# Patient Record
Sex: Male | Born: 1965 | Race: Black or African American | Hispanic: No | Marital: Married | Smoking: Never smoker
Health system: Southern US, Community
[De-identification: ages and names within clinical notes are randomized; demographics above are authoritative.]

## PROBLEM LIST (undated history)

## (undated) DIAGNOSIS — Z860100 Personal history of colon polyps, unspecified: Secondary | ICD-10-CM

## (undated) DIAGNOSIS — M1990 Unspecified osteoarthritis, unspecified site: Secondary | ICD-10-CM

## (undated) DIAGNOSIS — M2550 Pain in unspecified joint: Secondary | ICD-10-CM

## (undated) DIAGNOSIS — D689 Coagulation defect, unspecified: Secondary | ICD-10-CM

## (undated) DIAGNOSIS — M549 Dorsalgia, unspecified: Secondary | ICD-10-CM

## (undated) HISTORY — PX: JOINT REPLACEMENT: SHX530

## (undated) HISTORY — PX: COLONOSCOPY: SHX174

## (undated) HISTORY — DX: Unspecified osteoarthritis, unspecified site: M19.90

## (undated) HISTORY — PX: ESOPHAGOGASTRODUODENOSCOPY: SHX1529

## (undated) HISTORY — PX: FRACTURE SURGERY: SHX138

## (undated) HISTORY — PX: WRIST FRACTURE SURGERY: SHX121

## (undated) HISTORY — DX: Coagulation defect, unspecified: D68.9

## (undated) HISTORY — PX: TONSILLECTOMY AND ADENOIDECTOMY: SUR1326

---
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
## 2005-10-20 ENCOUNTER — Emergency Department (HOSPITAL_COMMUNITY): Admission: EM | Admit: 2005-10-20 | Discharge: 2005-10-20 | Payer: Self-pay | Admitting: Emergency Medicine

## 2009-10-28 ENCOUNTER — Emergency Department (HOSPITAL_COMMUNITY): Admission: EM | Admit: 2009-10-28 | Discharge: 2009-10-29 | Payer: Self-pay | Admitting: Emergency Medicine

## 2011-01-08 LAB — POCT I-STAT, CHEM 8
Chloride: 109 mEq/L (ref 96–112)
Creatinine, Ser: 1 mg/dL (ref 0.4–1.5)
HCT: 42 % (ref 39.0–52.0)
Hemoglobin: 14.3 g/dL (ref 13.0–17.0)
Potassium: 4.1 mEq/L (ref 3.5–5.1)
Sodium: 137 mEq/L (ref 135–145)

## 2012-01-31 ENCOUNTER — Encounter (INDEPENDENT_AMBULATORY_CARE_PROVIDER_SITE_OTHER): Payer: Self-pay | Admitting: Surgery

## 2012-01-31 ENCOUNTER — Ambulatory Visit (INDEPENDENT_AMBULATORY_CARE_PROVIDER_SITE_OTHER): Payer: BC Managed Care – PPO | Admitting: Surgery

## 2012-01-31 NOTE — Progress Notes (Addendum)
Re:   [name] DOB:   1966/07/02 MRN:   [id number]  ASSESSMENT AND PLAN: 1.  Morbid obesity.  Weight 349, BMIT -52.9.  Per the 1991 NIH Consensus Statement, the patient is a candidate for bariatric surgery.  The patient attended our initial information session and reviewed the types of bariatric surgery.    The patient is interested in the Roux en Y Gastric Bypass.  I discussed with the patient the indications and risks of bariatric surgery.  The potential risks of surgery include, but are not limited to, bleeding, infection, leak from the bowel, DVT and PE, open surgery, long term nutrition consequences, and death.  The patient understands the importance of compliance and long term follow-up with our group after surgery.  From here we will obtain lab tests, x-rays, nutrition consult, and psych consult.  He has successfully lost weight before and I encouraged him to lose weight in preparation for surgery.  2.  Left knee arthritis form injury. 3.  Back and right hip problems.   Chief Complaint  Patient presents with  . Bariatric Pre-op    RYN   REFERRING PHYSICIAN:  Goes to [facility] for evaluation.  HISTORY OF PRESENT ILLNESS: [name] is a pleasant 46 y.o. (DOB: [birth year])  AA male whose primary care physician is [facility] (he sees several physicians there) and comes to me today for gastric bypass/bariatric surgery.  The patient went to information sessions. He cannot remember the name position spoke. He has at least 3 coworkers who have had gastric bypass. He has discussed surgery with them. He also thought about LAP-BAND surgery, but a subtotal gastric bypass.  He has tried multiple diets throughout his life. At about 23, he did Metafast for a year and half loss for 360 pounds to about 212 pounds. He got married and gained a lot of the weight back. He has tried limiting his oral intake. He went throught the Bariatric Weight loss center in [place] and  took their pill.  He did well, but it was expensive, so he discontinued it.  Most recently he tried working out, but because of left knee pain had to give up on the idea of working out to lose weight.   No past medical history on file.   No past surgical history on file.    No current outpatient prescriptions on file.     No Known Allergies  REVIEW OF SYSTEMS: Skin:  No history of rash.  No history of abnormal moles. Infection:  No history of hepatitis or HIV.  No history of MRSA. Neurologic:  No history of stroke.  No history of seizure.  No history of headaches. Cardiac:  No history of hypertension. No history of heart disease.  No history of prior cardiac catheterization.  No history of seeing a cardiologist. Pulmonary:  Does not smoke cigarettes.  No asthma or bronchitis.  No OSA/CPAP.  Endocrine:  No diabetes. No thyroid disease. Gastrointestinal:  No history of stomach disease.  No history of liver disease.  No history of gall bladder disease.  No history of pancreas disease.  Colonoscopy in 2010 removed polyp. Unsure of name of colonoscopist. Urologic:  No history of kidney stones.  No history of bladder infections. Musculoskeletal:  Injured left knee playing baseball at 46 years old. Sees Dr[facility] Oleta [facility] at [facility]  Left knee with "bone on bone", too young for knee replacement. Hematologic:  No bleeding disorder.  No history of anemia.  Not anticoagulated. Psycho-social:  The patient is oriented.   The patient has no obvious psychologic or social impairment to understanding our conversation and plan.  SOCIAL and FAMILY HISTORY: Works at [employer] center. Married.  Wife works at [employer] call center.  PHYSICAL EXAM: BP 144/86  Pulse 80  Temp(Src) 97.7 F (36.5 C) (Temporal)  Resp 18  Ht 5\' 8"  (1.727 m)  Wt 349 lb (158.305 kg)  BMI 53.07 kg/m2  General: AA M who is alert and generally healthy appearing.  HEENT: Normal. Pupils equal. Good dentition. Neck: Supple.  No mass.  No thyroid mass.  Carotid pulse okay with no bruit. Lymph Nodes:  No supraclavicular or cervical nodes. Lungs: Clear to auscultation and symmetric breath sounds. Heart:  RRR. No murmur or rub.  Abdomen: Soft. No mass. No tenderness. No hernia. Normal bowel sounds.  No abdominal scars.  He is about 1/2 apple and 1/2 pear. Rectal: Not done.  He is followed by colonoscopies. Extremities:  Good strength and ROM  in upper and lower extremities.  Scar over left knee.  Valgus change at knee on both LE. Neurologic:  Grossly intact to motor and sensory function. Psychiatric: Has normal mood and affect. Behavior is normal.   DATA REVIEWED: Notes  Ovidio Kin, MD,  [facility], [place] [address] [place]  [address]   [place], [place]    [address] Phone:  [phone] FAX:  [phone]

## 2012-02-01 LAB — TSH: TSH: 2.81 u[IU]/mL (ref 0.450–4.500)

## 2012-02-01 LAB — COMPREHENSIVE METABOLIC PANEL
Albumin/Globulin Ratio: 1.5 (ref 1.1–2.5)
Albumin: 4.3 g/dL (ref 3.5–5.5)
BUN: 17 mg/dL (ref 6–24)
Creatinine, Ser: 1.01 mg/dL (ref 0.76–1.27)
GFR calc Af Amer: 105 mL/min/{1.73_m2} (ref >59)
GFR calc non Af Amer: 91 mL/min/{1.73_m2} (ref >59)
Globulin, Total: 2.8 g/dL (ref 1.5–4.5)
Glucose: 84 mg/dL (ref 65–99)
Total Bilirubin: 0.6 mg/dL (ref 0.0–1.2)
Total Protein: 7.1 g/dL (ref 6.0–8.5)

## 2012-02-01 LAB — CBC WITH DIFFERENTIAL/PLATELET
Basos: 0 % (ref 0–3)
HCT: 41.3 % (ref 37.5–51.0)
Hemoglobin: 13.7 g/dL (ref 12.6–17.7)
Lymphs: 34 % (ref 14–46)
MCH: 28 pg (ref 26.6–33.0)
Monocytes: 7 % (ref 4–13)
Neutrophils Absolute: 4.5 10*3/uL (ref 1.8–7.8)
RBC: 4.9 x10E6/uL (ref 4.14–5.80)

## 2012-02-14 ENCOUNTER — Ambulatory Visit (HOSPITAL_COMMUNITY)
Admission: RE | Admit: 2012-02-14 | Discharge: 2012-02-14 | Disposition: A | Discharge status: Home / Self Care | Payer: BC Managed Care – PPO | Source: Ambulatory Visit | Attending: Surgery | Admitting: Surgery

## 2012-02-14 ENCOUNTER — Encounter (HOSPITAL_COMMUNITY)
Admission: RE | Disposition: A | Discharge status: Home / Self Care | Payer: Self-pay | Source: Ambulatory Visit | Attending: Surgery

## 2012-02-14 DIAGNOSIS — Z01818 Encounter for other preprocedural examination: Secondary | ICD-10-CM | POA: Insufficient documentation

## 2012-02-14 HISTORY — PX: BREATH TEK H PYLORI: SHX5422

## 2012-02-14 SURGERY — BREATH TEST, FOR HELICOBACTER PYLORI
Anesthesia: Choice

## 2012-02-15 ENCOUNTER — Encounter (HOSPITAL_COMMUNITY): Payer: Self-pay | Admitting: Surgery

## 2012-02-21 ENCOUNTER — Ambulatory Visit (HOSPITAL_COMMUNITY): Admission: RE | Admit: 2012-02-21 | Payer: BC Managed Care – PPO | Source: Ambulatory Visit

## 2012-02-21 ENCOUNTER — Ambulatory Visit (HOSPITAL_COMMUNITY): Payer: BC Managed Care – PPO

## 2012-02-26 ENCOUNTER — Encounter: Payer: BC Managed Care – PPO | Attending: Surgery | Admitting: *Deleted

## 2012-02-26 ENCOUNTER — Encounter: Payer: Self-pay | Admitting: *Deleted

## 2012-02-26 DIAGNOSIS — Z713 Dietary counseling and surveillance: Secondary | ICD-10-CM | POA: Insufficient documentation

## 2012-02-26 DIAGNOSIS — Z01818 Encounter for other preprocedural examination: Secondary | ICD-10-CM | POA: Insufficient documentation

## 2012-02-26 NOTE — Progress Notes (Addendum)
  Pre-Op Assessment Visit:  Pre-Operative RYGB Surgery  Medical Nutrition Therapy:  Appt start time: 0900 end time:  1000.  Patient was seen on 02/26/12 for Pre-Operative RYGB Nutrition Assessment. Assessment and letter of approval faxed to [facility] B[facility]ric Surgery Program coordinator on 02/27/2012.  Approval letter sent to [facility] center and will be available in the chart under the media tab.  TANITA  BODY COMP RESULTS  02/26/12  %Fat 40.5%  FM (lbs) 142.5  FFM (lbs) 209.5  TBW (lbs) 153.5   Handouts given during visit include:  Pre-Op Goals   Bariatric Protein Shakes  Patient to call for Pre-Op and Post-Op Nutrition Education at [facility] when surgery is scheduled.

## 2012-02-26 NOTE — Patient Instructions (Signed)
   Follow Pre-Op Nutrition Goals to prepare for Gastric Bypass Surgery.   Call the [facility] at [phone] once you have been given your surgery date to enrolled in the Pre-Op Nutrition Class. You will need to attend this nutrition class 3-4 weeks prior to your surgery. 

## 2012-02-27 ENCOUNTER — Encounter: Payer: Self-pay | Admitting: *Deleted

## 2012-03-06 ENCOUNTER — Ambulatory Visit (HOSPITAL_COMMUNITY)
Admission: RE | Admit: 2012-03-06 | Discharge: 2012-03-06 | Disposition: A | Discharge status: Home / Self Care | Payer: BC Managed Care – PPO | Source: Ambulatory Visit | Attending: Surgery | Admitting: Surgery

## 2012-03-06 ENCOUNTER — Other Ambulatory Visit: Payer: Self-pay

## 2012-03-06 DIAGNOSIS — Z6841 Body Mass Index (BMI) 40.0 and over, adult: Secondary | ICD-10-CM | POA: Insufficient documentation

## 2012-03-06 DIAGNOSIS — Z01818 Encounter for other preprocedural examination: Secondary | ICD-10-CM | POA: Insufficient documentation

## 2012-03-22 ENCOUNTER — Ambulatory Visit (INDEPENDENT_AMBULATORY_CARE_PROVIDER_SITE_OTHER): Payer: BC Managed Care – PPO | Admitting: Internal Medicine

## 2012-03-22 ENCOUNTER — Encounter: Payer: Self-pay | Admitting: *Deleted

## 2012-03-22 ENCOUNTER — Encounter: Payer: Self-pay | Admitting: Internal Medicine

## 2012-03-22 DIAGNOSIS — IMO0002 Reserved for concepts with insufficient information to code with codable children: Secondary | ICD-10-CM

## 2012-03-22 DIAGNOSIS — M1710 Unilateral primary osteoarthritis, unspecified knee: Secondary | ICD-10-CM | POA: Insufficient documentation

## 2012-03-22 DIAGNOSIS — R0609 Other forms of dyspnea: Secondary | ICD-10-CM

## 2012-03-22 DIAGNOSIS — R0683 Snoring: Secondary | ICD-10-CM

## 2012-03-22 NOTE — Assessment & Plan Note (Signed)
Wt Readings from Last 3 Encounters:  03/22/12 355 lb (161.027 kg)  02/26/12 352 lb (159.666 kg)  01/31/12 349 lb (158.305 kg)   Pt undergoing medical preparation for lap band to assist in weight loss goals Recent labs reviewed co morbidities included advanced L knee DJD, possible OSA based on hx but no other formal testing done at this time Will complete letter and perform follow up visits as needed Pt is an excellent candidate for bariatric intervention

## 2012-03-22 NOTE — Progress Notes (Signed)
Subjective:    Patient ID: [name], male    DOB: August 10, 1966, 46 y.o.   MRN: [id number]  HPI  New pt to me and our practice, here to establish care  Needs help with obesity and plans for weight loss - consideration for lap band ongoing Reports weight problems began in HS following L knee injury (1988) upto 415# by age 33 Then down to 212# with MediFast diet and maintained same until age 49 Slow weight gain ove past 73yr up to current weight (355#) Meet with nutrition 02/2012 and previously 2012 x 6 months (lost 50#, regained 28#) Diet includes "juicer" meals  Past Medical History  Diagnosis Date  . Morbid obesity     BMI 52.9 01/31/12  . Osteoarthritis     L knee  . History of chicken pox   . History of colonic polyps    Family History  Problem Relation Age of Onset  . Diabetes Mother 82  . Lung cancer Father 1  . Hypertension Father   . Alcohol abuse Father   . Stomach cancer Mother 51  . Hypertension Mother    History  Substance Use Topics  . Smoking status: Never Smoker   . Smokeless tobacco: Not on file   Comment: married, lives with spouse. works with [employer], Clinical biochemist  . Alcohol Use: No    Review of Systems Constitutional: Negative for fever or weight change.  Respiratory: Negative for cough and shortness of breath.  +snoring per wife Cardiovascular: Negative for chest pain or palpitations.  Gastrointestinal: Negative for abdominal pain, no bowel changes.  Musculoskeletal: Negative for gait problem - occasional L knee joint swelling.  Skin: Negative for rash.  Neurological: Negative for dizziness or headache.  No other specific complaints in a complete review of systems (except as listed in HPI above).     Objective:   Physical Exam  BP 128/82  Pulse 81  Temp(Src) 98.6 F (37 C) (Oral)  Ht 5' 8.5" (1.74 m)  Wt 355 lb (161.027 kg)  BMI 53.19 kg/m2  SpO2 97% Wt Readings from Last 3 Encounters:  03/22/12 355 lb (161.027 kg)    02/26/12 352 lb (159.666 kg)  01/31/12 349 lb (158.305 kg)   Constitutional:  He is obese, appears well-developed and well-nourished. No distress.  Neck: Normal range of motion. Neck supple. No JVD present. No thyromegaly present.  Cardiovascular: Normal rate, regular rhythm and normal heart sounds.  No murmur heard. no BLE edema Pulmonary/Chest: Effort normal and breath sounds normal. No respiratory distress. no wheezes.  Abdominal: Soft. Bowel sounds are normal. Patient exhibits no distension. There is no tenderness.  Musculoskeletal: Normal range of motion. Patient exhibits no edema.  Neurological: he is alert and oriented to person, place, and time. No cranial nerve deficit. Coordination normal.  Skin: Skin is warm and dry.  No erythema or ulceration.  Psychiatric: he has a normal mood and affect. behavior is normal. Judgment and thought content normal.    Lab Results  Component Value Date   WBC 7.7 01/31/2012   HGB 13.7 01/31/2012   HCT 41.3 01/31/2012   GLUCOSE 84 01/31/2012   ALT 21 01/31/2012   AST 21 01/31/2012   NA 137 01/31/2012   K 4.6 01/31/2012   CL 102 01/31/2012   CREATININE 1.01 01/31/2012   BUN 17 01/31/2012   CO2 23 01/31/2012   TSH 2.810 01/31/2012       Assessment & Plan:  See problem list. Medications and  labs reviewed today.  Time spent with pt today 35 minutes, greater than 50% time spent counseling patient on obesity, weight loss plans and medication review. Also review of prior records

## 2012-03-22 NOTE — Patient Instructions (Signed)
It was good to see you today. We have reviewed your prior records including labs and tests today Will complete medical letter for bariatric surgery requirements as discussed today -  If monthly visits are required by insurance before surgery, please schedule follow up in 1 month Otherwise, Please schedule followup in 6-12 months for medical physical and labs, call sooner if problems.

## 2012-03-25 ENCOUNTER — Telehealth: Payer: Self-pay | Admitting: *Deleted

## 2012-03-25 NOTE — Telephone Encounter (Signed)
Called pt to inform him that his [facility] form & letter is ready for pick-up. No answer so left info on VM. Put up front in cabinet for pick-up... 03/25/12@8 :44am/LMB

## 2012-05-03 ENCOUNTER — Other Ambulatory Visit (INDEPENDENT_AMBULATORY_CARE_PROVIDER_SITE_OTHER): Payer: Self-pay | Admitting: Surgery

## 2012-05-28 ENCOUNTER — Telehealth (INDEPENDENT_AMBULATORY_CARE_PROVIDER_SITE_OTHER): Payer: Self-pay

## 2012-05-28 NOTE — Telephone Encounter (Signed)
I tried to call the patient and give him his pre and post op appointments.  There was no answer on either # and no identifying name on the voicemail.

## 2012-05-30 ENCOUNTER — Telehealth (INDEPENDENT_AMBULATORY_CARE_PROVIDER_SITE_OTHER): Payer: Self-pay

## 2012-05-30 NOTE — Telephone Encounter (Signed)
The patient's wife returned my call and I gave her the appointments I had scheduled.

## 2012-06-06 ENCOUNTER — Encounter: Payer: Self-pay | Admitting: *Deleted

## 2012-06-06 ENCOUNTER — Encounter: Payer: BC Managed Care – PPO | Attending: Surgery | Admitting: *Deleted

## 2012-06-06 DIAGNOSIS — Z01818 Encounter for other preprocedural examination: Secondary | ICD-10-CM | POA: Insufficient documentation

## 2012-06-06 DIAGNOSIS — Z713 Dietary counseling and surveillance: Secondary | ICD-10-CM | POA: Insufficient documentation

## 2012-06-06 NOTE — Patient Instructions (Signed)
Follow:   Pre-Op Diet per MD 2 weeks prior to surgery  Phase 2- Liquids (clear/full) 2 weeks after surgery  Vitamin/Mineral/Calcium guidelines for purchasing bariatric supplements  Exercise guidelines pre and post-op per MD  Follow-up at [facility] in 2 weeks post-op for diet advancement. Contact Rodger Giangregorio as needed with questions/concerns. 

## 2012-06-06 NOTE — Progress Notes (Signed)
Bariatric Class:  Appt start time: 0830 end time:  0930.  Pre-Operative Nutrition Class  Patient was seen on 06/06/2012 for Pre-Operative Bariatric Surgery Education at [facility].   Surgery date: 06/18/11 Surgery type: RYGB Start weight at [facility]: 352.0 lbs  Weight today: 353.5 lbs Weight change: + 1.5 lbs Total weight lost: 0 lbs BMI: 53.8  TANITA  BODY COMP RESULTS  02/26/12 06/06/12   %Fat 40.5% 43.3%   Fat Mass (lbs) 142.5 153.0   Fat Free Mass (lbs) 209.5 200.5   Total Body Water (lbs) 153.5 147.0   Samples given per MNT protocol: Bariatric Advantage Multivitamin Lot # [id number] Exp:12/13  Celebrate Vitamins Multivitamin Lot # [id number] Exp: 07/14  Celebrate Vitamins Multivitamin Complete Lot # [id number] Exp: 11/14  Celebrate Vitamins Calcium Citrate Lot # [id number] Exp: 10/14  Celebrate Vitamins Sublingual B-12 Lot # [id number] Exp: 05/15  Corliss Marcus Protein Powder Lot # [id number] Exp: 12/14  The following the learning objective met by the patient during this course:   Identifies Pre-Op Dietary Goals and will begin 2 weeks pre-operatively  Identifies appropriate sources of fluids and proteins   States protein recommendations and appropriate sources pre and post-operatively  Identifies Post-Operative Dietary Goals and will follow for 2 weeks post-operatively  Identifies appropriate multivitamin and calcium sources  Describes the need for physical activity post-operatively and will follow MD recommendations  States when to call healthcare provider regarding medication questions or post-operative complications  Handouts given during class include:  Pre-Op Bariatric Surgery Diet Handout  Protein Shake Handout  Post-Op Bariatric Surgery Nutrition Handout  BELT Program Information Flyer  Support Group Information Flyer  Follow-Up Plan: Patient will follow-up at [facility] 2 weeks post operatively for diet advancement per MD.

## 2012-06-07 ENCOUNTER — Encounter (HOSPITAL_COMMUNITY): Payer: Self-pay

## 2012-06-07 ENCOUNTER — Encounter (INDEPENDENT_AMBULATORY_CARE_PROVIDER_SITE_OTHER): Payer: Self-pay | Admitting: Surgery

## 2012-06-07 ENCOUNTER — Ambulatory Visit (INDEPENDENT_AMBULATORY_CARE_PROVIDER_SITE_OTHER): Payer: BC Managed Care – PPO | Admitting: Surgery

## 2012-06-07 NOTE — Progress Notes (Signed)
 Re:   [name] DOB:   04/27/1966 MRN:   [id number]  ASSESSMENT AND PLAN: 1.  Morbid obesity.  Weight 349, BMIT -52.9.  Per the 1991 NIH Consensus Statement, the patient is a candidate for bariatric surgery.  The patient attended our initial information session and reviewed the types of bariatric surgery.    The patient is interested in the Roux en Y Gastric Bypass.  I discussed with the patient the indications and risks of bariatric surgery.  The potential risks of surgery include, but are not limited to, bleeding, infection, leak from the bowel, DVT and PE, open surgery, long term nutrition consequences, and death.  The patient understands the importance of compliance and long term follow-up with our group after surgery.  His surgery is on for 06/17/2012.  2.  Left knee arthritis form injury. 3.  Back and right hip problems.   Chief Complaint  Patient presents with  . Bariatric Pre-op    Gastric bypass 06/17/12   REFERRING PHYSICIAN:  Goes to [facility] for evaluation.  HISTORY OF PRESENT ILLNESS: [name] is a pleasant 46 y.o. (DOB: 06/08/1966)  AA male whose primary care physician is [facility] (he sees several physicians there) and comes to me today for pre op discussion of gastric bypass surgery.  He came by himself, his wife is working.  We reviewed his workup.  I told him I was disappointed that he had not lost weight, in fact, I pointed out to him that he had gained weight.  We talked about follow up and watching things after the surgery.  I answered questions about the surgery, its risks and recovery.  I talked to him about our 2 deaths have come in males >BMI 50.  He is just starting on the pre op diet.  I gave him a prescription for golytely.  US - 03/06/2012 - negative Upper GI - 03/06/2012 - negative Breath Tek - 02/14/2012 - neg Psychology - Dr. E. Lurey - 03/30/2012   Past Medical History  Diagnosis Date  . Morbid obesity     BMI 52.9 01/31/12  .  Osteoarthritis     L knee  . History of chicken pox   . History of colonic polyps     Past Surgical History  Procedure Date  . Breath tek h pylori 02/14/2012    Procedure: BREATH TEK H PYLORI;  Surgeon: Vernella Niznik H Ibrahim Mcpheeters, MD;  Location: [facility];  Service: General;  Laterality: N/A;  . Knee surgery     Left  . Wrist surgery     LEFT  . Tonsillectomy      Current Outpatient Prescriptions  Medication Sig Dispense Refill  . diclofenac (VOLTAREN) 75 MG EC tablet Take 75 mg by mouth 2 (two) times daily.         No Known Allergies  REVIEW OF SYSTEMS: Skin:  No history of rash.  No history of abnormal moles. Infection:  No history of hepatitis or HIV.  No history of MRSA. Neurologic:  No history of stroke.  No history of seizure.  No history of headaches. Cardiac:  No history of hypertension. No history of heart disease.  No history of prior cardiac catheterization.  No history of seeing a cardiologist. Pulmonary:  Does not smoke cigarettes.  No asthma or bronchitis.  No OSA/CPAP.  Endocrine:  No diabetes. No thyroid disease. Gastrointestinal:  No history of stomach disease.  No history of liver disease.  No history   of gall bladder disease.  No history of pancreas disease.  Colonoscopy in 2010 removed polyp. Unsure of name of colonoscopist. Urologic:  No history of kidney stones.  No history of bladder infections. Musculoskeletal:  Injured left knee playing baseball at 46 years old. Sees Dr. Hilt at [facility].  Left knee with "bone on bone", too young for knee replacement. Hematologic:  No bleeding disorder.  No history of anemia.  Not anticoagulated. Psycho-social:  The patient is oriented.   The patient has no obvious psychologic or social impairment to understanding our conversation and plan.  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  Wife works at [employer] call center.  PHYSICAL EXAM: BP 138/82  Pulse 74  Temp 97.8 F (36.6 C) (Temporal)   Resp 16  Ht 5' 8" (1.727 m)  Wt 353 lb 4 oz (160.233 kg)  BMI 53.71 kg/m2  General: AA M who is alert and generally healthy appearing.  HEENT: Normal. Pupils equal. Good dentition. Neck: Supple. No mass.  No thyroid mass.  Carotid pulse okay with no bruit. Lymph Nodes:  No supraclavicular or cervical nodes. Lungs: Clear to auscultation and symmetric breath sounds. Heart:  RRR. No murmur or rub.  Abdomen: Soft. No mass. No tenderness. No hernia. Normal bowel sounds.  No abdominal scars.  He is about 1/2 apple and 1/2 pear.  He is doughy. Rectal: Not done.  He is followed by colonoscopies. Extremities:  Good strength and ROM  in upper and lower extremities.  Scar over left knee.  Valgus change at knee on both LE. Neurologic:  Grossly intact to motor and sensory function. Psychiatric: Has normal mood and affect. Behavior is normal.   DATA REVIEWED: Labs and letters.  Kalana Yust, MD,  [facility] [address]  [address]   [place], [place]    [address] Phone:  [phone] FAX:  [phone]  

## 2012-06-11 NOTE — Patient Instructions (Signed)
22 [name]  06/11/2012   Your procedure is scheduled on:  06/17/12 1150am-355pm  Report to [facility] at 0930 AM.  Call this number if you have problems the morning of surgery: [phone]   Remember:   Do not eat food:After Midnight.  May have clear liquids:until Midnight .    Take these medicines the morning of surgery with A SIP OF WATER   Do not wear jewelry,   Do not wear lotions, powders, or perfumes.  . Men may shave face and neck.  Do not bring valuables to the hospital.  Contacts, dentures or bridgework may not be worn into surgery.  Leave suitcase in the car. After surgery it may be brought to your room.  For patients admitted to the hospital, checkout time is 11:00 AM the day of discharge.      Special Instructions: CHG Shower Use Special Wash: 1/2 bottle night before surgery and 1/2 bottle morning of surgery. shower chin to toes with CHG. Wash face and private parts with regular soap.    Please read over the following fact sheets that you were given: MRSA Information, coughing and deep breathing exercises, leg exercises

## 2012-06-12 ENCOUNTER — Encounter (HOSPITAL_COMMUNITY): Payer: Self-pay

## 2012-06-12 ENCOUNTER — Encounter (HOSPITAL_COMMUNITY)
Admission: RE | Admit: 2012-06-12 | Discharge: 2012-06-12 | Disposition: A | Discharge status: Home / Self Care | Payer: BC Managed Care – PPO | Source: Ambulatory Visit | Attending: Surgery | Admitting: Surgery

## 2012-06-12 LAB — COMPREHENSIVE METABOLIC PANEL
AST: 18 U/L (ref 0–37)
BUN: 17 mg/dL (ref 6–23)
CO2: 25 mEq/L (ref 19–32)
Chloride: 103 mEq/L (ref 96–112)
Creatinine, Ser: 0.99 mg/dL (ref 0.50–1.35)
GFR calc non Af Amer: >90 mL/min (ref >90)
Total Bilirubin: 0.5 mg/dL (ref 0.3–1.2)

## 2012-06-12 LAB — SURGICAL PCR SCREEN
MRSA, PCR: NEGATIVE
Staphylococcus aureus: NEGATIVE

## 2012-06-12 LAB — CBC
MCH: 28.3 pg (ref 26.0–34.0)
MCHC: 33.9 g/dL (ref 30.0–36.0)
Platelets: 223 10*3/uL (ref 150–400)

## 2012-06-17 ENCOUNTER — Inpatient Hospital Stay (HOSPITAL_COMMUNITY): Payer: BC Managed Care – PPO | Admitting: Anesthesiology

## 2012-06-17 ENCOUNTER — Encounter (HOSPITAL_COMMUNITY): Payer: Self-pay | Admitting: Anesthesiology

## 2012-06-17 ENCOUNTER — Inpatient Hospital Stay (HOSPITAL_COMMUNITY)
Admission: RE | Admit: 2012-06-17 | Discharge: 2012-06-19 | DRG: 288 | Disposition: A | Discharge status: Home / Self Care | Payer: BC Managed Care – PPO | Source: Ambulatory Visit | Attending: Surgery | Admitting: Surgery

## 2012-06-17 ENCOUNTER — Encounter (HOSPITAL_COMMUNITY): Payer: Self-pay | Admitting: *Deleted

## 2012-06-17 ENCOUNTER — Encounter (HOSPITAL_COMMUNITY)
Admission: RE | Disposition: A | Discharge status: Home / Self Care | Payer: Self-pay | Source: Ambulatory Visit | Attending: Surgery

## 2012-06-17 DIAGNOSIS — M12569 Traumatic arthropathy, unspecified knee: Secondary | ICD-10-CM | POA: Diagnosis present

## 2012-06-17 DIAGNOSIS — M549 Dorsalgia, unspecified: Secondary | ICD-10-CM | POA: Diagnosis present

## 2012-06-17 DIAGNOSIS — Z6841 Body Mass Index (BMI) 40.0 and over, adult: Secondary | ICD-10-CM

## 2012-06-17 DIAGNOSIS — M1710 Unilateral primary osteoarthritis, unspecified knee: Secondary | ICD-10-CM

## 2012-06-17 DIAGNOSIS — Z01812 Encounter for preprocedural laboratory examination: Secondary | ICD-10-CM

## 2012-06-17 HISTORY — PX: GASTRIC ROUX-EN-Y: SHX5262

## 2012-06-17 LAB — GLUCOSE, CAPILLARY

## 2012-06-17 SURGERY — LAPAROSCOPIC ROUX-EN-Y GASTRIC
Anesthesia: General | Site: Abdomen | Wound class: Clean Contaminated

## 2012-06-17 MED ORDER — LIDOCAINE HCL (CARDIAC) 20 MG/ML IV SOLN
INTRAVENOUS | Status: DC | PRN
  Administered 2012-06-17: 100 mg via INTRAVENOUS

## 2012-06-17 MED ORDER — DEXTROSE 5 % IV SOLN
1.0000 g | Freq: Three times a day (TID) | INTRAVENOUS | Status: AC
  Administered 2012-06-17 – 2012-06-18 (×2): 1 g via INTRAVENOUS
  Filled 2012-06-17 (×2): qty 1

## 2012-06-17 MED ORDER — DEXAMETHASONE SODIUM PHOSPHATE 10 MG/ML IJ SOLN
INTRAMUSCULAR | Status: DC | PRN
  Administered 2012-06-17: 10 mg via INTRAVENOUS

## 2012-06-17 MED ORDER — ACETAMINOPHEN 10 MG/ML IV SOLN: 1000.0000 mg | Freq: Once | INTRAVENOUS | Status: DC | PRN

## 2012-06-17 MED ORDER — ACETAMINOPHEN 160 MG/5ML PO SOLN
650.0000 mg | ORAL | Status: DC | PRN
  Administered 2012-06-19: 650 mg via ORAL
  Filled 2012-06-17: qty 20.3

## 2012-06-17 MED ORDER — PROPOFOL 10 MG/ML IV EMUL
INTRAVENOUS | Status: DC | PRN
  Administered 2012-06-17: 200 mg via INTRAVENOUS

## 2012-06-17 MED ORDER — CHLORHEXIDINE GLUCONATE 4 % EX LIQD
1.0000 "application " | Freq: Once | CUTANEOUS | Status: DC
  Filled 2012-06-17: qty 15

## 2012-06-17 MED ORDER — OXYCODONE HCL 5 MG PO TABS: 5.0000 mg | ORAL_TABLET | Freq: Once | ORAL | Status: DC | PRN

## 2012-06-17 MED ORDER — DEXTROSE 5 % IV SOLN
2.0000 g | INTRAVENOUS | Status: AC
  Administered 2012-06-17: 2 g via INTRAVENOUS
  Filled 2012-06-17: qty 2

## 2012-06-17 MED ORDER — HYDROMORPHONE HCL PF 1 MG/ML IJ SOLN
INTRAMUSCULAR | Status: DC | PRN
  Administered 2012-06-17 (×2): 1 mg via INTRAVENOUS

## 2012-06-17 MED ORDER — ACETAMINOPHEN 10 MG/ML IV SOLN
INTRAVENOUS | Status: DC | PRN
  Administered 2012-06-17: 1000 mg via INTRAVENOUS

## 2012-06-17 MED ORDER — OXYCODONE-ACETAMINOPHEN 5-325 MG/5ML PO SOLN
5.0000 mL | ORAL | Status: DC | PRN
  Administered 2012-06-18 – 2012-06-19 (×2): 5 mL via ORAL
  Filled 2012-06-17 (×2): qty 5

## 2012-06-17 MED ORDER — UNJURY CHOCOLATE CLASSIC POWDER
2.0000 [oz_av] | Freq: Four times a day (QID) | ORAL | Status: DC
  Administered 2012-06-19 (×2): 2 [oz_av] via ORAL

## 2012-06-17 MED ORDER — UNJURY VANILLA POWDER: 2.0000 [oz_av] | Freq: Four times a day (QID) | ORAL | Status: DC

## 2012-06-17 MED ORDER — NEOSTIGMINE METHYLSULFATE 1 MG/ML IJ SOLN
INTRAMUSCULAR | Status: DC | PRN
  Administered 2012-06-17: 5 mg via INTRAVENOUS

## 2012-06-17 MED ORDER — PROMETHAZINE HCL 25 MG/ML IJ SOLN
6.2500 mg | INTRAMUSCULAR | Status: DC | PRN
  Administered 2012-06-17: 6.25 mg via INTRAVENOUS

## 2012-06-17 MED ORDER — GLYCOPYRROLATE 0.2 MG/ML IJ SOLN
INTRAMUSCULAR | Status: DC | PRN
  Administered 2012-06-17: .6 mg via INTRAVENOUS

## 2012-06-17 MED ORDER — ROCURONIUM BROMIDE 100 MG/10ML IV SOLN
INTRAVENOUS | Status: DC | PRN
  Administered 2012-06-17: 20 mg via INTRAVENOUS
  Administered 2012-06-17: 45 mg via INTRAVENOUS
  Administered 2012-06-17: 5 mg via INTRAVENOUS
  Administered 2012-06-17 (×2): 10 mg via INTRAVENOUS

## 2012-06-17 MED ORDER — POTASSIUM CHLORIDE IN NACL 20-0.45 MEQ/L-% IV SOLN
INTRAVENOUS | Status: DC
  Administered 2012-06-17 – 2012-06-19 (×4): via INTRAVENOUS
  Filled 2012-06-17 (×8): qty 1000

## 2012-06-17 MED ORDER — ONDANSETRON HCL 4 MG/2ML IJ SOLN: 4.0000 mg | INTRAMUSCULAR | Status: DC | PRN

## 2012-06-17 MED ORDER — BUPIVACAINE HCL (PF) 0.25 % IJ SOLN
INTRAMUSCULAR | Status: DC | PRN
  Administered 2012-06-17: 30 mL

## 2012-06-17 MED ORDER — FENTANYL CITRATE 0.05 MG/ML IJ SOLN
INTRAMUSCULAR | Status: DC | PRN
  Administered 2012-06-17: 100 ug via INTRAVENOUS
  Administered 2012-06-17: 50 ug via INTRAVENOUS
  Administered 2012-06-17: 100 ug via INTRAVENOUS
  Administered 2012-06-17: 50 ug via INTRAVENOUS
  Administered 2012-06-17: 100 ug via INTRAVENOUS
  Administered 2012-06-17: 50 ug via INTRAVENOUS
  Administered 2012-06-17: 100 ug via INTRAVENOUS

## 2012-06-17 MED ORDER — HYDROMORPHONE HCL PF 1 MG/ML IJ SOLN
INTRAMUSCULAR | Status: AC
  Filled 2012-06-17: qty 1

## 2012-06-17 MED ORDER — ACETAMINOPHEN 10 MG/ML IV SOLN
INTRAVENOUS | Status: AC
  Filled 2012-06-17: qty 100

## 2012-06-17 MED ORDER — CEFOXITIN SODIUM-DEXTROSE 1-4 GM-% IV SOLR (PREMIX)
INTRAVENOUS | Status: AC
  Filled 2012-06-17: qty 100

## 2012-06-17 MED ORDER — MEPERIDINE HCL 50 MG/ML IJ SOLN: 6.2500 mg | INTRAMUSCULAR | Status: DC | PRN

## 2012-06-17 MED ORDER — PROMETHAZINE HCL 25 MG/ML IJ SOLN
INTRAMUSCULAR | Status: AC
  Filled 2012-06-17: qty 1

## 2012-06-17 MED ORDER — MIDAZOLAM HCL 5 MG/5ML IJ SOLN
INTRAMUSCULAR | Status: DC | PRN
  Administered 2012-06-17: 2 mg via INTRAVENOUS

## 2012-06-17 MED ORDER — MORPHINE SULFATE 2 MG/ML IJ SOLN
2.0000 mg | INTRAMUSCULAR | Status: DC | PRN
  Administered 2012-06-17 – 2012-06-18 (×4): 2 mg via INTRAVENOUS
  Filled 2012-06-17 (×3): qty 1
  Filled 2012-06-17: qty 2

## 2012-06-17 MED ORDER — UNJURY CHICKEN SOUP POWDER: 2.0000 [oz_av] | Freq: Four times a day (QID) | ORAL | Status: DC

## 2012-06-17 MED ORDER — BUPIVACAINE HCL (PF) 0.25 % IJ SOLN
INTRAMUSCULAR | Status: AC
  Filled 2012-06-17: qty 30

## 2012-06-17 MED ORDER — SUCCINYLCHOLINE CHLORIDE 20 MG/ML IJ SOLN
INTRAMUSCULAR | Status: DC | PRN
  Administered 2012-06-17: 180 mg via INTRAVENOUS

## 2012-06-17 MED ORDER — HEPARIN SODIUM (PORCINE) 5000 UNIT/ML IJ SOLN
5000.0000 [IU] | Freq: Once | INTRAMUSCULAR | Status: AC
  Administered 2012-06-17: 5000 [IU] via SUBCUTANEOUS
  Filled 2012-06-17: qty 1

## 2012-06-17 MED ORDER — LACTATED RINGERS IV SOLN
INTRAVENOUS | Status: DC
  Administered 2012-06-17: 15:00:00 via INTRAVENOUS
  Administered 2012-06-17: 1000 mL via INTRAVENOUS
  Administered 2012-06-17 (×2): via INTRAVENOUS

## 2012-06-17 MED ORDER — ONDANSETRON HCL 4 MG/2ML IJ SOLN
INTRAMUSCULAR | Status: DC | PRN
  Administered 2012-06-17: 4 mg via INTRAVENOUS

## 2012-06-17 MED ORDER — HYDROMORPHONE HCL PF 1 MG/ML IJ SOLN
0.2500 mg | INTRAMUSCULAR | Status: DC | PRN
  Administered 2012-06-17 (×4): 0.25 mg via INTRAVENOUS

## 2012-06-17 MED ORDER — LABETALOL HCL 5 MG/ML IV SOLN
INTRAVENOUS | Status: DC | PRN
  Administered 2012-06-17: 10 mg via INTRAVENOUS
  Administered 2012-06-17: 5 mg via INTRAVENOUS
  Administered 2012-06-17: 10 mg via INTRAVENOUS

## 2012-06-17 MED ORDER — TISSEEL VH 10 ML EX KIT
PACK | CUTANEOUS | Status: AC
  Filled 2012-06-17: qty 2

## 2012-06-17 MED ORDER — HEPARIN SODIUM (PORCINE) 5000 UNIT/ML IJ SOLN
5000.0000 [IU] | Freq: Three times a day (TID) | INTRAMUSCULAR | Status: DC
  Administered 2012-06-17 – 2012-06-19 (×5): 5000 [IU] via SUBCUTANEOUS
  Filled 2012-06-17 (×8): qty 1

## 2012-06-17 MED ORDER — ACETAMINOPHEN 10 MG/ML IV SOLN
1000.0000 mg | Freq: Four times a day (QID) | INTRAVENOUS | Status: AC
  Administered 2012-06-17 – 2012-06-18 (×4): 1000 mg via INTRAVENOUS
  Filled 2012-06-17 (×6): qty 100

## 2012-06-17 MED ORDER — TISSEEL VH 10 ML EX KIT
PACK | CUTANEOUS | Status: DC | PRN
  Administered 2012-06-17: 10 mL

## 2012-06-17 MED ORDER — NALOXONE HCL 0.4 MG/ML IJ SOLN
INTRAMUSCULAR | Status: AC
  Filled 2012-06-17: qty 1

## 2012-06-17 MED ORDER — OXYCODONE HCL 5 MG/5ML PO SOLN
5.0000 mg | Freq: Once | ORAL | Status: DC | PRN
  Filled 2012-06-17: qty 5

## 2012-06-17 MED ORDER — LACTATED RINGERS IR SOLN
Status: DC | PRN
  Administered 2012-06-17: 3000 mL

## 2012-06-17 SURGICAL SUPPLY — 77 items
ADH SKN CLS APL DERMABOND .7 (GAUZE/BANDAGES/DRESSINGS)
APL SRG 32X5 SNPLK LF DISP (MISCELLANEOUS) ×2
APPLICATOR COTTON TIP 6IN STRL (MISCELLANEOUS) ×6 IMPLANT
APPLIER CLIP ROT 13.4 12 LRG (CLIP)
APR CLP LRG 13.4X12 ROT 20 MLT (CLIP)
BAG SPEC RTRVL LRG 6X4 10 (ENDOMECHANICALS)
BLADE SURG 15 STRL LF DISP TIS (BLADE) ×2 IMPLANT
BLADE SURG 15 STRL SS (BLADE) ×3
CABLE HIGH FREQUENCY MONO STRZ (ELECTRODE) IMPLANT
CANISTER SUCTION 2500CC (MISCELLANEOUS) ×3 IMPLANT
CLIP APPLIE ROT 13.4 12 LRG (CLIP) IMPLANT
CLIP SUT LAPRA TY ABSORB (SUTURE) ×4 IMPLANT
CLOTH BEACON ORANGE TIMEOUT ST (SAFETY) ×3 IMPLANT
CUTTER LINEAR ENDO ART 45 ETS (STAPLE) ×3 IMPLANT
DECANTER SPIKE VIAL GLASS SM (MISCELLANEOUS) ×2 IMPLANT
DERMABOND ADVANCED (GAUZE/BANDAGES/DRESSINGS)
DERMABOND ADVANCED .7 DNX12 (GAUZE/BANDAGES/DRESSINGS) IMPLANT
DEVICE SUTURE ENDOST 10MM (ENDOMECHANICALS) ×3 IMPLANT
DISSECTOR BLUNT TIP ENDO 5MM (MISCELLANEOUS) IMPLANT
DRAIN PENROSE 18X1/4 LTX STRL (WOUND CARE) ×3 IMPLANT
DRAPE CAMERA CLOSED 9X96 (DRAPES) ×3 IMPLANT
DUPLOJECT EASY PREP 4ML (MISCELLANEOUS) ×3 IMPLANT
GAUZE SPONGE 4X4 16PLY XRAY LF (GAUZE/BANDAGES/DRESSINGS) ×3 IMPLANT
GLOVE BIOGEL PI IND STRL 7.0 (GLOVE) ×2 IMPLANT
GLOVE BIOGEL PI INDICATOR 7.0 (GLOVE) ×1
GLOVE SURG SIGNA 7.5 PF LTX (GLOVE) ×3 IMPLANT
GOWN STRL NON-REIN LRG LVL3 (GOWN DISPOSABLE) ×3 IMPLANT
GOWN STRL REIN XL XLG (GOWN DISPOSABLE) ×6 IMPLANT
HOVERMATT SINGLE USE (MISCELLANEOUS) ×3 IMPLANT
KIT BASIN OR (CUSTOM PROCEDURE TRAY) ×3 IMPLANT
KIT GASTRIC LAVAGE 34FR ADT (SET/KITS/TRAYS/PACK) ×3 IMPLANT
NDL SPNL 22GX3.5 QUINCKE BK (NEEDLE) ×2 IMPLANT
NEEDLE SPNL 22GX3.5 QUINCKE BK (NEEDLE) ×3 IMPLANT
NS IRRIG 1000ML POUR BTL (IV SOLUTION) ×3 IMPLANT
PACK CARDIOVASCULAR III (CUSTOM PROCEDURE TRAY) ×3 IMPLANT
PEN SKIN MARKING BROAD (MISCELLANEOUS) ×3 IMPLANT
POUCH SPECIMEN RETRIEVAL 10MM (ENDOMECHANICALS) IMPLANT
RELOAD 45 VASCULAR/THIN (ENDOMECHANICALS) ×9 IMPLANT
RELOAD BLUE (STAPLE) ×2 IMPLANT
RELOAD ENDO STITCH 2.0 (ENDOMECHANICALS) ×33
RELOAD GOLD (STAPLE) IMPLANT
RELOAD STAPLE 45 2.5 WHT GRN (ENDOMECHANICALS) IMPLANT
RELOAD STAPLE 45 3.5 BLU ETS (ENDOMECHANICALS) IMPLANT
RELOAD STAPLE TA45 3.5 REG BLU (ENDOMECHANICALS) ×6 IMPLANT
RELOAD SUT SNGL STCH ABSRB 2-0 (ENDOMECHANICALS) ×12 IMPLANT
RELOAD SUT SNGL STCH BLK 2-0 (ENDOMECHANICALS) ×8 IMPLANT
RELOAD WHITE ECR60W (STAPLE) IMPLANT
SCALPEL HARMONIC ACE (MISCELLANEOUS) ×2 IMPLANT
SCISSORS LAP 5X35 DISP (ENDOMECHANICALS) ×2 IMPLANT
SEALANT SURGICAL APPL DUAL CAN (MISCELLANEOUS) ×3 IMPLANT
SET IRRIG TUBING LAPAROSCOPIC (IRRIGATION / IRRIGATOR) ×3 IMPLANT
SLEEVE Z-THREAD 12X100MM (TROCAR) ×6 IMPLANT
SLEEVE Z-THREAD 5X100MM (TROCAR) ×3 IMPLANT
SOLUTION ANTI FOG 6CC (MISCELLANEOUS) ×3 IMPLANT
SPONGE GAUZE 4X4 12PLY (GAUZE/BANDAGES/DRESSINGS) ×2 IMPLANT
STAPLER STANDARD HANDLE (STAPLE) ×3 IMPLANT
STAPLER VISISTAT 35W (STAPLE) ×2 IMPLANT
SUT MON AB 5-0 PS2 18 (SUTURE) ×4 IMPLANT
SUT RELOAD ENDO STITCH 2 48X1 (ENDOMECHANICALS) ×12
SUT RELOAD ENDO STITCH 2.0 (ENDOMECHANICALS) ×10
SUT VIC AB 2-0 SH 27 (SUTURE) ×9
SUT VIC AB 2-0 SH 27X BRD (SUTURE) ×2 IMPLANT
SUTURE RELOAD END STTCH 2 48X1 (ENDOMECHANICALS) ×12 IMPLANT
SUTURE RELOAD ENDO STITCH 2.0 (ENDOMECHANICALS) ×10 IMPLANT
SYR 20CC LL (SYRINGE) ×3 IMPLANT
SYR 50ML LL SCALE MARK (SYRINGE) ×3 IMPLANT
SYR CONTROL 10ML LL (SYRINGE) ×3 IMPLANT
TOWEL OR 17X26 10 PK STRL BLUE (TOWEL DISPOSABLE) ×3 IMPLANT
TRAY FOLEY CATH 14FRSI W/METER (CATHETERS) ×3 IMPLANT
TROCAR XCEL 12X100 BLDLESS (ENDOMECHANICALS) ×3 IMPLANT
TROCAR Z-THREAD FIOS 11X100 BL (TROCAR) ×3 IMPLANT
TROCAR Z-THREAD FIOS 12X100MM (TROCAR) ×3 IMPLANT
TROCAR Z-THREAD FIOS 5X100MM (TROCAR) ×3 IMPLANT
TUBING ENDO SMARTCAP (MISCELLANEOUS) ×3 IMPLANT
TUBING FILTER THERMOFLATOR (ELECTROSURGICAL) ×3 IMPLANT
WARMER LAPAROSCOPE (MISCELLANEOUS) ×3 IMPLANT
WATER STERILE IRR 1500ML POUR (IV SOLUTION) ×3 IMPLANT

## 2012-06-17 NOTE — Anesthesia Preprocedure Evaluation (Addendum)
Anesthesia Evaluation  Patient identified by MRN, date of birth, ID band Patient awake    Reviewed: Allergy & Precautions, H&P , NPO status , Patient's Chart, lab work & pertinent test results  Airway Mallampati: II TM Distance: >3 FB Neck ROM: Full    Dental  (+) Teeth Intact and Dental Advisory Given   Pulmonary neg pulmonary ROS,  breath sounds clear to auscultation  Pulmonary exam normal       Cardiovascular Rhythm:Regular Rate:Normal     Neuro/Psych negative neurological ROS     GI/Hepatic negative GI ROS, Neg liver ROS,   Endo/Other  negative endocrine ROS  Renal/GU negative Renal ROS     Musculoskeletal  (+) Arthritis -, Osteoarthritis,    Abdominal (+) + obese,   Peds  Hematology negative hematology ROS (+)   Anesthesia Other Findings   Reproductive/Obstetrics                          Anesthesia Physical Anesthesia Plan  ASA: III  Anesthesia Plan: General   Post-op Pain Management:    Induction: Intravenous  Airway Management Planned: Oral ETT  Additional Equipment:   Intra-op Plan:   Post-operative Plan: Extubation in OR  Informed Consent: I have reviewed the patients History and Physical, chart, labs and discussed the procedure including the risks, benefits and alternatives for the proposed anesthesia with the patient or authorized representative who has indicated his/her understanding and acceptance.   Dental advisory given  Plan Discussed with: CRNA and Surgeon  Anesthesia Plan Comments:         Anesthesia Quick Evaluation

## 2012-06-17 NOTE — Op Note (Signed)
PATIENT:   [name] DOB:   1966/05/27 MRN:   [id number]  DATE OF PROCEDURE: 06/17/2012                   FACILITY:  [facility]  OPERATIVE REPORT  PREOPERATIVE DIAGNOSIS:  Morbid obesity.  POSTOPERATIVE DIAGNOSIS:  Morbid obesity (weight 349, BMI of 52.9).  PROCEDURE:  Laparoscopic Roux-en-Y gastric bypass (intraoperative upper endoscopy by E. Andrey Campanile)  SURGEON:  Sandria Bales. Ezzard Standing, MD  FIRST ASSISTANTFredonia Highland, MD  ANESTHESIA:  General endotracheal.  Gaylan Gerold, MD - Anesthesiologist Peggy Williford - CRNA Junious Silk - CRNA Elisabeth Cara, CRNA - CRNA  General  ESTIMATED BLOOD LOSS:  Minimal.  LOCAL ANESTHESIA:  30 cc of 1/4% Marcaine  COMPLICATIONS:  None.  INDICATION FOR SURGERY:  [name] is a 46 y.o. AA male who sees Rene Paci, MD as her primary care doctor.  She has completed our preoperative bariatric program and now comes for a laparoscopic Roux-en-Y gastric bypass.  The indications, potential complications of surgery were explained to the patient.  Potential complications of the surgery include, but are not limited to, bleeding, infection, DVT, open surgery, and long-term nutritional consequences.  OPERATIVE NOTE:  The patient taken to room #1 where he went underwent a general endotracheal anesthetic, supervised by Gaylan Gerold, MD - Anesthesiologist and Junious Silk - CRNA.  The patient was given 2 g of cefoxitin at the beginning of the procedure.  A time-out was held and surgical checklist run.  The abdomen was prepped with ChloraPrep and sterilely draped.  I accessed the abdominal cavity through the left upper quadrant using a 12 mm Optiview trocar.  I placed 6 additional trocars: 5 mm subxiphoid, 12 mm right subcostal, 12 mm right paramedian, 12 mm left paramedian, 5 mm lateral subcostal, and a 11 mm below to the right of the umbilicus.  The abdomen was insufflated and abdominal exploration carried out.  Right and left lobes of  liver unremarkable.  The stomach that I could see was unremarkable.  He had a moderate amount of greater omentum which draped over the bowel.  I was able to push the omentum and transverse colon up and identified the ligament of Treitz to start the operation.  I measured 40 cm of the jejunum, starting at the ligament of Tritz, and divided the jejunum with a white load of 45 mm Ethicon Endo-GIA stapler.  I divided a short length into the mesentery.  I measured 100 cm of jejunum for the future gastric limb.  I put a Penrose drain on the future gastric limb of the jejunum.  I then did a side-to-side jejunojejunostomy.  I used a 45 mm white load.  I closed the enterotomy with 2 running 2-0 Vicryl sutures.  I tested the JJ anastomosis with an alligator forceps and then covered this with Tisseel.  I closed the mesenteric defect with a running 2-0 silk suture with a Laparo-tye on each end.  I then divided the omentum with a Harmonic Scalpel.  There was no a lot of omentum.  I placed the patient in reverse Trendelenburg and placed the liver retractor under the left lobe of the liver.  I then identified the gastroesophageal junction.  I went to the left at the angle of His and made a window at the esophago-gastric junction for a target as my dissection.  I then went on the lesser curve of the stomach, counted 5 cm from the gastroesophageal  junction down the lesser curve and dissected into the lesser sac.  I did the first firing of a 45 mm blue load Ethicon Endo-GIA stapler and then did 2 firings of the 60 mm blue load Ethicon Eschelon stapler.  I then did one additional firing of the 45 mm blue load Ethicon Endo-GIA stapler   This created a gastric pouch approximately 5 cm in length and 3 cm in width.  There was no bleeding from either the pouch or the stomach remnant site.  I placed Tisseel on the pouch side along the new greater curvature.  I over sewed the gastric remnant with a locking 2-0 Vicryl  suture with a Laparo-tye on each end..  I then brought the jejunum ante-colic, ante-gastric up to the new stomach pouch and placed a posterior running 2-0 Vicryl suture.  I then made an enterotomy into the stomach using the Ewald as a back stop and an enterotomy into the jejunum.  I did a stapled side-to-side gastrojejunal anastomosis with a 45 mm blue load of the Ethicon Endo GIA stapler.  This created about a 2.5 cm gastrojejunal anastomosis.  I closed the enterotomy with a 2 running 2-0 Vicryl sutures.  I passed the Ewald tube through the gastrojejunal anastomosis and then did an anterior Connell suture running of 2-0 Vicryl suture for the anterior layer of the gastrojejunostomy.  The Ewald tube was then removed without difficulty.  I then closed the [place] defect with a figure-of-eight 2-0 silk suture between the mesentery of the transverse colon and the mesentery of the distal jejunum.  Dr. Andrey Campanile then scrubbed out and did an intraoperative upper endoscopy.  He identified the esophagogastric junction about 45 cm, the gastrojejunal anastomosis about 49 cm.  I clamped off the small bowel.  He insufflated air and I flooded the abdomen with saline. There was no bubbling or evidence of air leak.  He then withdrew the scope and he will dictate that portion of the operation.    I then re-inspected the anastomoses, sucked out the saline, placed Tisseel over the stomach pouch and gastrojejunal anastomosis.   The liver retractor was removed.  The trocars were removed.  There was no bleeding at any trocar site.  The skin at each trocar site was closed with a 5-0 Monocryl suture.  I infiltrated a total about 30 cc of 0.25% Marcaine at the trocar sites.    After the skin incisions were closed with sutures they were painted with Dermabond.  The sponge and needle count were correct at the end of the case.  The patient tolerated the procedure well, was transported to the recovery room in good condition.   Ovidio Kin, MD, [facility] Pager: [phone] Office phone:  [phone]

## 2012-06-17 NOTE — Transfer of Care (Signed)
Immediate Anesthesia Transfer of Care Note  Patient: [name]  Procedure(s) Performed: Procedure(s) (LRB): LAPAROSCOPIC ROUX-EN-Y GASTRIC (N/A) UPPER GI ENDOSCOPY ()  Patient Location: PACU  Anesthesia Type: General  Level of Consciousness: sedated, patient cooperative and responds to stimulaton  Airway & Oxygen Therapy: Patient Spontanous Breathing and Patient connected to face mask oxgen  Post-op Assessment: Report given to PACU RN and Post -op Vital signs reviewed and stable  Post vital signs: Reviewed and stable  Complications: No apparent anesthesia complications

## 2012-06-17 NOTE — Interval H&P Note (Signed)
History and Physical Interval Note:  06/17/2012 10:56 AM  [name]  has presented today for surgery, with the diagnosis of morbid obesity   The various methods of treatment have been discussed with the patient and family. Wife will get here about 3PM.  Her number is in the chart.  He has lost about 11 pounds getting ready for surgery.  After consideration of risks, benefits and other options for treatment, the patient has consented to  Procedure(s) (LRB): LAPAROSCOPIC ROUX-EN-Y GASTRIC (N/A) as a surgical intervention .    The patient's history has been reviewed, patient examined, no change in status, stable for surgery.  I have reviewed the patient's chart and labs.  Questions were answered to the patient's satisfaction.     Joffrey Kerce H

## 2012-06-17 NOTE — H&P (View-Only) (Signed)
Re:   [name] DOB:   [birth year] MRN:   [id number]  ASSESSMENT AND PLAN: 1.  Morbid obesity.  Weight 349, BMIT -52.9.  Per the 1991 NIH Consensus Statement, the patient is a candidate for bariatric surgery.  The patient attended our initial information session and reviewed the types of bariatric surgery.    The patient is interested in the Roux en Y Gastric Bypass.  I discussed with the patient the indications and risks of bariatric surgery.  The potential risks of surgery include, but are not limited to, bleeding, infection, leak from the bowel, DVT and PE, open surgery, long term nutrition consequences, and death.  The patient understands the importance of compliance and long term follow-up with our group after surgery.  His surgery is on for 06/17/2012.  2.  Left knee arthritis form injury. 3.  Back and right hip problems.   Chief Complaint  Patient presents with  . Bariatric Pre-op    Gastric bypass 06/17/12   REFERRING PHYSICIAN:  Goes to [facility] for evaluation.  HISTORY OF PRESENT ILLNESS: [name] is a pleasant 46 y.o. (DOB: 02/27/1966)  AA male whose primary care physician is [facility] (he sees several physicians there) and comes to me today for pre op discussion of gastric bypass surgery.  He came by himself, his wife is working.  We reviewed his workup.  I told him I was disappointed that he had not lost weight, in fact, I pointed out to him that he had gained weight.  We talked about follow up and watching things after the surgery.  I answered questions about the surgery, its risks and recovery.  I talked to him about our 2 deaths have come in males >BMI 105.  He is just starting on the pre op diet.  I gave him a prescription for golytely.  [place] - 03/06/2012 - negative Upper GI - 03/06/2012 - negative Breath Tek - 02/14/2012 - neg Psychology - Dr. Vernona Rieger - 03/30/2012   Past Medical History  Diagnosis Date  . Morbid obesity     BMI 52.9 01/31/12  .  Osteoarthritis     L knee  . History of chicken pox   . History of colonic polyps     Past Surgical History  Procedure Date  . Breath tek h pylori 02/14/2012    Procedure: BREATH TEK H PYLORI;  Surgeon: Kandis Cocking, MD;  Location: [facility];  Service: General;  Laterality: N/A;  . Knee surgery     Left  . Wrist surgery     LEFT  . Tonsillectomy      Current Outpatient Prescriptions  Medication Sig Dispense Refill  . diclofenac (VOLTAREN) 75 MG EC tablet Take 75 mg by mouth 2 (two) times daily.         No Known Allergies  REVIEW OF SYSTEMS: Skin:  No history of rash.  No history of abnormal moles. Infection:  No history of hepatitis or HIV.  No history of MRSA. Neurologic:  No history of stroke.  No history of seizure.  No history of headaches. Cardiac:  No history of hypertension. No history of heart disease.  No history of prior cardiac catheterization.  No history of seeing a cardiologist. Pulmonary:  Does not smoke cigarettes.  No asthma or bronchitis.  No OSA/CPAP.  Endocrine:  No diabetes. No thyroid disease. Gastrointestinal:  No history of stomach disease.  No history of liver disease.  No history  of gall bladder disease.  No history of pancreas disease.  Colonoscopy in 2010 removed polyp. Unsure of name of colonoscopist. Urologic:  No history of kidney stones.  No history of bladder infections. Musculoskeletal:  Injured left knee playing baseball at 46 years old. Sees Dr. Oleta Mouse at [facility].  Left knee with "bone on bone", too young for knee replacement. Hematologic:  No bleeding disorder.  No history of anemia.  Not anticoagulated. Psycho-social:  The patient is oriented.   The patient has no obvious psychologic or social impairment to understanding our conversation and plan.  SOCIAL and FAMILY HISTORY: Works at [employer] center. Married.  Wife works at [employer] call center.  PHYSICAL EXAM: BP 138/82  Pulse 74  Temp 97.8 F (36.6 C) (Temporal)   Resp 16  Ht 5\' 8"  (1.727 m)  Wt 353 lb 4 oz (160.233 kg)  BMI 53.71 kg/m2  General: AA M who is alert and generally healthy appearing.  HEENT: Normal. Pupils equal. Good dentition. Neck: Supple. No mass.  No thyroid mass.  Carotid pulse okay with no bruit. Lymph Nodes:  No supraclavicular or cervical nodes. Lungs: Clear to auscultation and symmetric breath sounds. Heart:  RRR. No murmur or rub.  Abdomen: Soft. No mass. No tenderness. No hernia. Normal bowel sounds.  No abdominal scars.  He is about 1/2 apple and 1/2 pear.  He is doughy. Rectal: Not done.  He is followed by colonoscopies. Extremities:  Good strength and ROM  in upper and lower extremities.  Scar over left knee.  Valgus change at knee on both LE. Neurologic:  Grossly intact to motor and sensory function. Psychiatric: Has normal mood and affect. Behavior is normal.   DATA REVIEWED: Labs and letters.  Ovidio Kin, MD,  [facility] [address] [place]  [address]   [place], [place]    [address] Phone:  [phone] FAX:  [phone]

## 2012-06-17 NOTE — Preoperative (Signed)
Beta Blockers   Reason not to administer Beta Blockers:Not Applicable 

## 2012-06-17 NOTE — Op Note (Signed)
Preoperative diagnosis: morbid obesity bmi 46  Postoperative diagnosis: Same   Procedure: Esophagogastrojejunoscopy   Surgeon: Mary Sella. Trevione Wert M.D., FACS   Anesthesia: Gen.   Indications for procedure: 46 yo AAM undergoing laparoscopic roux-en-y gastric bypass needing an upper endoscopy  Description of procedure: After we have completed the new gastrojejunostomy, I scrubbed out and obtained the Olympus endoscope. I gently placed endoscope in the patient's oropharynx and gently glided it down the esophagus without any difficulty under direct visualization. Once I was in the gastric pouch, I insufflated the pouch was air. The pouch was approximately 4 cm in size (GE junction at 41 cm; gastrojejunal anastomosis at 46cm). I was able to cannulate and advanced the scope through the gastrojejunostomy. Dr. Ezzard Standing had placed saline in the upper abdomen. Upon further insufflation of the gastric pouch there was no evidence of bubbles. Upon further inspection of the gastric pouch, the mucosa appeared normal. There is no evidence of any mucosal abnormality. The gastric pouch and Roux limb were decompressed. The width of the gastrojejunal anastomosis was at least 2 cm. The scope was withdrawn. The patient tolerated this portion of the procedure well. Please see Dr Allene Pyo operative note for details regarding the laparoscopic roux-en-y gastric bypass.  Mary Sella. Andrey Campanile, MD, FACS General, Bariatric, & Minimally Invasive Surgery [facility], [place]

## 2012-06-17 NOTE — Anesthesia Postprocedure Evaluation (Signed)
Anesthesia Post Note  Patient: [name]  Procedure(s) Performed: Procedure(s) (LRB): LAPAROSCOPIC ROUX-EN-Y GASTRIC (N/A) UPPER GI ENDOSCOPY ()  Anesthesia type: General  Patient location: PACU  Post pain: Pain level controlled  Post assessment: Post-op Vital signs reviewed  Last Vitals: BP 118/77  Pulse 77  Temp 36.4 C  Resp 17  Ht 5\' 8"  (1.727 m)  Wt 339 lb 8 oz (153.996 kg)  BMI 51.62 kg/m2  SpO2 97%  Post vital signs: Reviewed  Level of consciousness: sedated  Complications: No apparent anesthesia complications. Pt to go up to floor with ET capnography for 24 hrs.

## 2012-06-18 ENCOUNTER — Inpatient Hospital Stay (HOSPITAL_COMMUNITY): Payer: BC Managed Care – PPO

## 2012-06-18 ENCOUNTER — Encounter (HOSPITAL_COMMUNITY): Payer: Self-pay | Admitting: Surgery

## 2012-06-18 DIAGNOSIS — Z09 Encounter for follow-up examination after completed treatment for conditions other than malignant neoplasm: Secondary | ICD-10-CM

## 2012-06-18 LAB — HEMOGLOBIN AND HEMATOCRIT, BLOOD
HCT: 36.6 % — ABNORMAL LOW (ref 39.0–52.0)
Hemoglobin: 12.5 g/dL — ABNORMAL LOW (ref 13.0–17.0)

## 2012-06-18 LAB — CBC WITH DIFFERENTIAL/PLATELET
Basophils Absolute: 0 10*3/uL (ref 0.0–0.1)
Eosinophils Relative: 0 % (ref 0–5)
HCT: 38.3 % — ABNORMAL LOW (ref 39.0–52.0)
Lymphocytes Relative: 6 % — ABNORMAL LOW (ref 12–46)
Lymphs Abs: 1 10*3/uL (ref 0.7–4.0)
MCV: 83.6 fL (ref 78.0–100.0)
Monocytes Absolute: 0.7 10*3/uL (ref 0.1–1.0)
RDW: 13 % (ref 11.5–15.5)
WBC: 17.9 10*3/uL — ABNORMAL HIGH (ref 4.0–10.5)

## 2012-06-18 NOTE — Progress Notes (Signed)
*  PRELIMINARY RESULTS* Vascular Ultrasound Lower extremity venous duplex has been completed.  Preliminary findings: bilaterally no evidence of DVT or baker's cyst.  Farrel Demark, RDMS, RVT  06/18/2012, 11:20 AM

## 2012-06-18 NOTE — Progress Notes (Signed)
Pt alert and oriented; awaiting doppler study and UGI; remains NPO; denies any nausea or vomiting; denies flatus or BM at this time; voiding without difficulty; ambulating in halls without difficulty; c/o some abdominal soreness with relief from prn meds;using incentive spirometer;  pt already has follow up appts with [facility] and [facility]; pt aware of support group and BELT program; discharge instructions given for pt to review; questions answered. GASTRIC BYPASS/SLEEVE DISCHARGE INSTRUCTIONS  Drs. Fredrik Rigger, [place], [place], and [place] Call if you have any problems.   Call [phone] and ask for the surgeon on call.    If you need immediate assistance come to the ER at [facility]. Tell the ER personnel that you are a new post-op gastric bypass patient. Signs and symptoms to report:   Severe vomiting or nausea. If you cannot tolerate clear liquids for longer than 1 day, you need to call your surgeon.    Abdominal pain which does not get better after taking your pain medication   Fever greater than 101 F degree   Difficulty breathing   Chest pain    Redness, swelling, drainage, or foul odor at incision sites    If your incisions open or pull apart   Swelling or pain in calf (lower leg)   Diarrhea, frequent watery, uncontrolled bowel movements.   Constipation, (no bowel movements for 3 days) if this occurs, Take Milk of Magnesia, 2 tablespoons by mouth, 3 times a day for 2 days if needed.  Call your doctor if constipation continues. Stop taking Milk of Magnesia once you have had a bowel movement. You may also use Miralax according to the label instructions.   Anything you consider "abnormal for you".   Normal side effects after Surgery:   Unable to sleep at night or concentrate   Irritability   Being tearful (crying) or depressed   These are common complaints, possibly related to your anesthesia, stress of surgery and change in lifestyle, that usually go away a few weeks after surgery.  If  these feelings continue, call your medical doctor.  Wound Care You may have surgical glue, steri-strips, or staples over your incisions after surgery.  Surgical glue:  Looks like a clear film over your incisions and will wear off gradually. Steri-strips: Strips of tape over your incisions. You may notice a yellowish color on the skin underneath the steri-strips. This is a substance used to make the steri-strips stick better. Do not pull the steri-strips off - let them fall off.  Staples: Cherlynn Polo may be removed before you leave the hospital. If you go home with staples, call [facility] [place] [facility] [phone]) for an appointment with your surgeon's nurse to have staples removed in 7 - 10 days. Showering: You may shower two days after your surgery unless otherwise instructed by your surgeon. Wash gently around wounds with warm soapy water, rinse well, and gently pat dry.  If you have a drain, you may need someone to hold this while you shower. Avoid tub baths until staples are removed and incisions are healed.    Medications   Medications should be liquid or crushed if larger than the size of a dime.  Extended release pills should not be crushed.   Depending on the size and number of medications you take, you may need to stagger/change the time you take your medications so that you do not over-fill your pouch.    Make sure you follow-up with your primary care physician to make medication adjustments needed during rapid  weight loss and life-style adjustment.   If you are diabetic, follow up with the doctor that prescribes your diabetes medication(s) within one week after surgery and check your blood sugar regularly.   Do not drive while taking narcotics!   Do not take acetaminophen (Tylenol) and Roxicet or Lortab Elixir at the same time since these pain medications contain acetaminophen.  Diet at home: (First 2 Weeks) You will see the nutritionist two weeks after your surgery. She will advance  your diet if you are tolerating liquids well. Once at home, if you have severe vomiting or nausea and cannot tolerate clear liquids lasting longer than 1 day, call your surgeon.  Begin high protein shake 2 ounces every 3 hours, 5 - 6 times per day.  Gradually increase the amount you drink as tolerated.  You may find it easier to slowly sip shakes throughout the day.  It is important to get your proteins in first.   Protein Shake   Drink at least 2 ounces of shake 5-6 times per day   Each serving of protein shakes should have a minimum of 15 grams of protein and no more than 5 grams of carbohydrate    Increase the amount of protein shake you drink as tolerated   Protein powder may be added to fluids such as non-fat milk or Lactaid milk (limit to 20 grams added protein powder per serving   The initial goal is to drink at least 8 ounces of protein shake/drink per day (or as directed by the nutritionist). Some examples of protein shakes are [employer], Dillard's, EAS Edge HP, and Unjury. Hydration   Gradually increase the amount of water and other liquids as tolerated (See Acceptable Fluids)   Gradually increase the amount of protein shake as tolerated     Sip fluids slowly and throughout the day   May use Sugar substitutes, use sparingly (limit to 6 - 8 packets per day). Your fluid goal is 64 ounces of fluid daily. It may take a few weeks to build up to this.         32 oz (or more) should be clear liquids and 32 oz (or more) should be full liquids.         Liquids should not contain sugar, caffeine, or carbonation! Acceptable Fluids Clear Liquids:   Water or Sugar-free flavored water, Fruit H2O   Decaffeinated coffee or tea (sugar-free)   Crystal Lite, Wyler's Lite, Minute Maid Lite   Sugar-free Jell-O   Bouillon or broth   Sugar-free Popsicle:   *Less than 20 calories each; Limit 1 per day   Full Liquids:              Protein Shakes/Drinks + 2 choices per day of other full  liquids shown below.    Other full liquids must be: No more than 12 grams of Carbs per serving,  No more than 3 grams of Fat per serving   Strained low-fat cream soup   Non-Fat milk   Fat-free Lactaid Milk   Sugar-free yogurt (Dannon Lite & Fit) Vitamins and Minerals (Start 1 day after surgery unless otherwise directed)   2 Chewable Multivitamin / Multimineral Supplement (i.e. Centrum for Adults)   Chewable Calcium Citrate with Vitamin D-3. Take 1500 mg each day.           (Example: 3 Chewable Calcium Plus 600 with Vitamin D-3 can be found at [facility])         Vitamin B-12, 350 -  500 micrograms (oral tablet) each day   Do not mix multivitamins containing iron with calcium supplements; take 2 hours   apart   Do not substitute Tums (calcium carbonate) for your calcium   Menstruating women and those at risk for anemia may need extra iron. Talk with your doctor to see if you need additional iron.    If you need extra iron:  Total daily Iron recommendations (including Vitamins) = 50 - 100 mg Iron/day Do not stop taking or change any vitamins or minerals until you talk to your nutritionist or surgeon. Your nutritionist and / or physician must approve all vitamin and mineral supplements. Exercise For maximum success, begin exercising as soon as your doctor recommends. Make sure your physician approves any physical activity.   Depending on fitness level, begin with a simple walking program   Walk 5-15 minutes each day, 7 days per week.    Slowly increase until you are walking 30-45 minutes per day   Consider joining our BELT program. [phone] or email belt@[employer] .edu Things to remember:    You may have sexual relations when you feel comfortable. It is VERY important for male patients to use a reliable birth control method. Fertility often increases after surgery. Do not get pregnant for at least 18 months.   It is very important to keep all follow up appointments with your surgeon, nutritionist,  primary care physician, and behavioral health practitioner. After the first year, please follow up with your bariatric surgeon at least once a year in order to maintain best weight loss results.  [facility] [place] [facility]: [phone] [facility]: [phone]   Free counseling is available for you and your family through collaboration between [facility] and [place]. Please call [phone] and leave a message.    Consider purchasing a medical alert bracelet that says you had gastric bypass surgery.    The [facility] has a free Bariatric Surgery Support Group that meets monthly, the 3rd Thursday, 6 pm, Classroom #1, [employer]. You may register online at [email or web address], but registration is not necessary. Select Classes and Support Groups, Bariatric Surgery, or Call [phone]   Do not return to work or drive until cleared by your surgeon   Use your CPAP when sleeping if applicable   Do not lift anything greater than ten pounds for at least two weeks  Talmadge Chad, RN Bariatric Nurse Coordinator

## 2012-06-18 NOTE — Progress Notes (Signed)
General Surgery Note  LOS: 1 day  POD# 1  Assessment/Plan: 1.  LAPAROSCOPIC ROUX-EN-Y GASTRIC, UPPER GI ENDOSCOPY - D. Jeremiyah Cullens - 06/17/2012  Doing well.  For swallow and dopplers today.  2. Left knee arthritis form injury.  3. Back and right hip problems. 4.  DVT proph - SQ heparin  Subjective:  Doing well.  No specific complaint.  Objective:   Filed Vitals:   06/18/12 0548  BP: 157/85  Pulse: 81  Temp: 98.3 F (36.8 C)  Resp: 18     Intake/Output from previous day:  08/26 0701 - 08/27 0700 In: 5320 [I.V.:5320] Out: 2010 [Urine:1960; Blood:50]  Intake/Output this shift:      Physical Exam:   General: Obese WN AA M who is alert and oriented.    HEENT: Normal. Pupils equal. .   Lungs: Clear.   Abdomen: Soft, quiet.   Wound: Look good.   Neurologic:  Grossly intact to motor and sensory function.   Psychiatric: Has normal mood and affect.   Lab Results:    Basename 06/18/12 0355 06/17/12 1824  WBC 17.9* --  HGB 12.9* 13.5  HCT 38.3* 39.4  PLT 199 --    BMET   Basename 06/17/12 1824  NA --  K --  CL --  CO2 --  GLUCOSE --  BUN --  CREATININE 1.00  CALCIUM --    PT/INR  No results found for this basename: LABPROT:2,INR:2 in the last 72 hours  ABG  No results found for this basename: PHART:2,PCO2:2,PO2:2,HCO3:2 in the last 72 hours   Studies/Results:  No results found.   Anti-infectives:   Anti-infectives     Start     Dose/Rate Route Frequency Ordered Stop   06/17/12 1900   cefOXitin (MEFOXIN) 1 g in dextrose 5 % 50 mL IVPB        1 g 100 mL/hr over 30 Minutes Intravenous 3 times per day 06/17/12 1716 06/18/12 0648   06/17/12 0954   cefOXitin (MEFOXIN) 2 g in dextrose 5 % 50 mL IVPB        2 g 100 mL/hr over 30 Minutes Intravenous 60 min pre-op 06/17/12 0954 06/17/12 1144          Ovidio Kin, MD, FACS Pager: [phone],   [facility] Office: [phone] 06/18/2012

## 2012-06-18 NOTE — Care Management Note (Signed)
    Page 1 of 1   06/18/2012     3:06:18 PM   CARE MANAGEMENT NOTE 06/18/2012  Patient:  [name], [name]   Account Number:  [id number]  Date Initiated:  06/18/2012  Documentation initiated by:  Lorenda Ishihara  Subjective/Objective Assessment:   46 yo male admitted s/p gastric bypass. PTA lived at home with spouse.     Action/Plan:   Anticipated DC Date:  06/19/2012   Anticipated DC Plan:  HOME/SELF CARE      DC Planning Services  CM consult      Choice offered to / List presented to:             Status of service:  Completed, signed off Medicare Important Message given?   (If response is "NO", the following Medicare IM given date fields will be blank) Date Medicare IM given:   Date Additional Medicare IM given:    Discharge Disposition:  HOME/SELF CARE  Per UR Regulation:  Reviewed for med. necessity/level of care/duration of stay  If discussed at Long Length of Stay Meetings, dates discussed:    Comments:

## 2012-06-18 NOTE — Progress Notes (Signed)
UGI results called to Dr. Ezzard Standing; orders received to begin POD #1 diet; Collyn, RN notified of results and orders. Talmadge Chad, RN Bariatric Nurse Coordinator

## 2012-06-19 LAB — CBC WITH DIFFERENTIAL/PLATELET
Hemoglobin: 12.3 g/dL — ABNORMAL LOW (ref 13.0–17.0)
Lymphocytes Relative: 17 % (ref 12–46)
Lymphs Abs: 2.4 10*3/uL (ref 0.7–4.0)
Monocytes Relative: 7 % (ref 3–12)
Neutro Abs: 10.5 10*3/uL — ABNORMAL HIGH (ref 1.7–7.7)
Neutrophils Relative %: 75 % (ref 43–77)
RBC: 4.25 MIL/uL (ref 4.22–5.81)
WBC: 14 10*3/uL — ABNORMAL HIGH (ref 4.0–10.5)

## 2012-06-19 MED ORDER — OXYCODONE-ACETAMINOPHEN 5-325 MG/5ML PO SOLN: 5.0000 mL | ORAL | Status: DC | PRN

## 2012-06-19 NOTE — Progress Notes (Signed)
Pt alert and oriented; VSS; tolerating water well; will advance to protein shakes today and plan to discharge if pt tolerates well; denies any nausea or vomiting; denies flatus or BM at this time; voiding without difficulty; c/o some abdominal soreness with relief from prn meds; ambulating in hallways without difficulty; using incentive spirometer; pt already has follow up appts with [employer] and [facility]; discharge instructions reviewed and pt verbalized understanding of; aware of support group and BELT program. GASTRIC BYPASS/SLEEVE DISCHARGE INSTRUCTIONS  Drs. Fredrik Rigger, [place], [place], and [place] Call if you have any problems.   Call [phone] and ask for the surgeon on call.    If you need immediate assistance come to the ER at [facility]. Tell the ER personnel that you are a new post-op gastric bypass patient. Signs and symptoms to report:   Severe vomiting or nausea. If you cannot tolerate clear liquids for longer than 1 day, you need to call your surgeon.    Abdominal pain which does not get better after taking your pain medication   Fever greater than 101 F degree   Difficulty breathing   Chest pain    Redness, swelling, drainage, or foul odor at incision sites    If your incisions open or pull apart   Swelling or pain in calf (lower leg)   Diarrhea, frequent watery, uncontrolled bowel movements.   Constipation, (no bowel movements for 3 days) if this occurs, Take Milk of Magnesia, 2 tablespoons by mouth, 3 times a day for 2 days if needed.  Call your doctor if constipation continues. Stop taking Milk of Magnesia once you have had a bowel movement. You may also use Miralax according to the label instructions.   Anything you consider "abnormal for you".   Normal side effects after Surgery:   Unable to sleep at night or concentrate   Irritability   Being tearful (crying) or depressed   These are common complaints, possibly related to your anesthesia, stress of surgery and change in  lifestyle, that usually go away a few weeks after surgery.  If these feelings continue, call your medical doctor.  Wound Care You may have surgical glue, steri-strips, or staples over your incisions after surgery.  Surgical glue:  Looks like a clear film over your incisions and will wear off gradually. Steri-strips: Strips of tape over your incisions. You may notice a yellowish color on the skin underneath the steri-strips. This is a substance used to make the steri-strips stick better. Do not pull the steri-strips off - let them fall off.  Staples: Cherlynn Polo may be removed before you leave the hospital. If you go home with staples, call [facility] [phone]) for an appointment with your surgeon's nurse to have staples removed in 7 - 10 days. Showering: You may shower two days after your surgery unless otherwise instructed by your surgeon. Wash gently around wounds with warm soapy water, rinse well, and gently pat dry.  If you have a drain, you may need someone to hold this while you shower. Avoid tub baths until staples are removed and incisions are healed.    Medications   Medications should be liquid or crushed if larger than the size of a dime.  Extended release pills should not be crushed.   Depending on the size and number of medications you take, you may need to stagger/change the time you take your medications so that you do not over-fill your pouch.    Make sure you follow-up with your primary  care physician to make medication adjustments needed during rapid weight loss and life-style adjustment.   If you are diabetic, follow up with the doctor that prescribes your diabetes medication(s) within one week after surgery and check your blood sugar regularly.   Do not drive while taking narcotics!   Do not take acetaminophen (Tylenol) and Roxicet or Lortab Elixir at the same time since these pain medications contain acetaminophen.  Diet at home: (First 2 Weeks) You will see the  nutritionist two weeks after your surgery. She will advance your diet if you are tolerating liquids well. Once at home, if you have severe vomiting or nausea and cannot tolerate clear liquids lasting longer than 1 day, call your surgeon.  Begin high protein shake 2 ounces every 3 hours, 5 - 6 times per day.  Gradually increase the amount you drink as tolerated.  You may find it easier to slowly sip shakes throughout the day.  It is important to get your proteins in first.   Protein Shake   Drink at least 2 ounces of shake 5-6 times per day   Each serving of protein shakes should have a minimum of 15 grams of protein and no more than 5 grams of carbohydrate    Increase the amount of protein shake you drink as tolerated   Protein powder may be added to fluids such as non-fat milk or Lactaid milk (limit to 20 grams added protein powder per serving   The initial goal is to drink at least 8 ounces of protein shake/drink per day (or as directed by the nutritionist). Some examples of protein shakes are [employer], [employer], EAS Edge HP, and Unjury. Hydration   Gradually increase the amount of water and other liquids as tolerated (See Acceptable Fluids)   Gradually increase the amount of protein shake as tolerated     Sip fluids slowly and throughout the day   May use Sugar substitutes, use sparingly (limit to 6 - 8 packets per day). Your fluid goal is 64 ounces of fluid daily. It may take a few weeks to build up to this.         32 oz (or more) should be clear liquids and 32 oz (or more) should be full liquids.         Liquids should not contain sugar, caffeine, or carbonation! Acceptable Fluids Clear Liquids:   Water or Sugar-free flavored water, Fruit H2O   Decaffeinated coffee or tea (sugar-free)   Crystal Lite, Wyler's Lite, Minute Maid Lite   Sugar-free Jell-O   Bouillon or broth   Sugar-free Popsicle:   *Less than 20 calories each; Limit 1 per day   Full Liquids:               Protein Shakes/Drinks + 2 choices per day of other full liquids shown below.    Other full liquids must be: No more than 12 grams of Carbs per serving,  No more than 3 grams of Fat per serving   Strained low-fat cream soup   Non-Fat milk   Fat-free Lactaid Milk   Sugar-free yogurt (Dannon Lite & Fit) Vitamins and Minerals (Start 1 day after surgery unless otherwise directed)   2 Chewable Multivitamin / Multimineral Supplement (i.e. Centrum for Adults)   Chewable Calcium Citrate with Vitamin D-3. Take 1500 mg each day.           (Example: 3 Chewable Calcium Plus 600 with Vitamin D-3 can be found at [facility])  Vitamin B-12, 350 - 500 micrograms (oral tablet) each day   Do not mix multivitamins containing iron with calcium supplements; take 2 hours   apart   Do not substitute Tums (calcium carbonate) for your calcium   Menstruating women and those at risk for anemia may need extra iron. Talk with your doctor to see if you need additional iron.    If you need extra iron:  Total daily Iron recommendations (including Vitamins) = 50 - 100 mg Iron/day Do not stop taking or change any vitamins or minerals until you talk to your nutritionist or surgeon. Your nutritionist and / or physician must approve all vitamin and mineral supplements. Exercise For maximum success, begin exercising as soon as your doctor recommends. Make sure your physician approves any physical activity.   Depending on fitness level, begin with a simple walking program   Walk 5-15 minutes each day, 7 days per week.    Slowly increase until you are walking 30-45 minutes per day   Consider joining our BELT program. [phone] or email belt@[employer] .edu Things to remember:    You may have sexual relations when you feel comfortable. It is VERY important for male patients to use a reliable birth control method. Fertility often increases after surgery. Do not get pregnant for at least 18 months.   It is very important to keep all  follow up appointments with your surgeon, nutritionist, primary care physician, and behavioral health practitioner. After the first year, please follow up with your bariatric surgeon at least once a year in order to maintain best weight loss results.  [facility]: [phone] [facility]: [phone]   Free counseling is available for you and your family through collaboration between [facility] and [place]. Please call [phone] and leave a message.    Consider purchasing a medical alert bracelet that says you had gastric bypass surgery.    The [facility] has a free Bariatric Surgery Support Group that meets monthly, the 3rd Thursday, 6 pm, Classroom #1, [employer]. You may register online at [email or web address], but registration is not necessary. Select Classes and Support Groups, Bariatric Surgery, or Call [phone]   Do not return to work or drive until cleared by your surgeon   Use your CPAP when sleeping if applicable   Do not lift anything greater than ten pounds for at least two weeks  Talmadge Chad, RN Bariatric nurse coordinator

## 2012-06-19 NOTE — Discharge Summary (Signed)
NAMEREBECCA, MOTTA NO.:  [id number]  MEDICAL RECORD NO.:  [id number]  LOCATION:  1529                         FACILITY:  [facility]  PHYSICIAN:  Sandria Bales. Ezzard Standing, M.D.  DATE OF BIRTH:  July 11, 1966  DATE OF ADMISSION:  06/17/2012 DATE OF DISCHARGE:  06/19/2012                              DISCHARGE SUMMARY   DISCHARGE DIAGNOSES: 1. Morbid obesity.  His weight is 349, BMI of 52.9. 2. 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 3. History of back and right hip problems.  OPERATION PERFORMED:  The patient underwent a laparoscopic Roux-en-Y gastric bypass by Dr. Ovidio Kin on June 17, 2012.  HISTORY OF ILLNESS:  Mr. Varma is a 46 year old African American male who sees the physicians at [facility] of his primary care doctor though he does not have one physician he seems to see the most.  He went to our preop bariatric program for evaluation for bariatric surgery and decided to have a Roux-en-Y gastric bypass.  His significant comorbidity is that he has had left knee arthritis from injury and he has had some back and right hip problems, all probably related to his weight.  Of note, he has been on Voltaren for these pain. We talked about having this stopped this postop because of risk of ulcer changes with his bypass.  HOSPITAL COURSE:  He was taken to the operating room on the day of admission, June 17, 2012, where he underwent a laparoscopic Roux-en-Y gastric bypass.    He did well his first postoperative day.  His white blood count was 17,900, hemoglobin was 12.9.  He had upper GI swallow, which showed no evidence of leak.  He had Dopplers, which showed no evidence of DVT and he was started on water.  He is now 2 days postop.  He will try protein drinks before he leaves today.  His white blood count is 14,000 today and he is ready for discharge if he tolerates his protein drinks well.  DISCHARGE INSTRUCTIONS:  He should be out of work for 1 month  until July 19, 2012.  For activity, he can drive in 3 to 4 days if he is doing well.   He is to do no heavy lifting over 15 pounds about 10 days. For wound management.  He can get a shower.  For diet, he is to follow a postoperative diet. He has appointment to see a nutritionist back in 2 weeks for advancing his diet and he has an appointment for that.   I have written a prescription for Roxicet for pain and he is to call for any problems.   Sandria Bales. Ezzard Standing, M.D., FACS   DHN/MEDQ  D:  06/19/2012  T:  06/19/2012  Job:  [phone]  cc:   Physicians at [facility]  Vilinda Flake, Ph.D. Fax: [phone]  Dr. Oleta Mouse

## 2012-06-19 NOTE — Progress Notes (Signed)
General Surgery Note  LOS: 2 days  POD# 2  Assessment/Plan: 1.  LAPAROSCOPIC ROUX-EN-Y GASTRIC, UPPER GI ENDOSCOPY - D. Surabhi Gadea - 06/17/2012  UGI and dopplers okay.  Tolerated water.  Will advance to protein drinks.  If he does well, will d/c home today.   D/C dictated -  #[id number]  2. Left knee arthritis form injury.  3. Back and right hip problems. 4.  DVT proph - SQ heparin  Subjective:  Doing well.  Has done well with water, feels a little full.  No nausea or vomiting.  Objective:   Filed Vitals:   06/19/12 0530  BP: 154/77  Pulse: 81  Temp: 98.2 F (36.8 C)  Resp: 20     Intake/Output from previous day:  08/27 0701 - 08/28 0700 In: 1755 [I.V.:1755] Out: 2375 [Urine:2375]  Intake/Output this shift:      Physical Exam:   General: Obese WN AA M who is alert and oriented.    HEENT: Normal. Pupils equal. .   Lungs: Clear.   Abdomen: Soft, rare BS.   Wound: Look good.   Neurologic:  Grossly intact to motor and sensory function.   Psychiatric: Has normal mood and affect.   Lab Results:     Basename 06/19/12 0025 06/18/12 1538 06/18/12 0355  WBC 14.0* -- 17.9*  HGB 12.3* 12.5* --  HCT 35.8* 36.6* --  PLT 182 -- 199    BMET    Basename 06/17/12 1824  NA --  K --  CL --  CO2 --  GLUCOSE --  BUN --  CREATININE 1.00  CALCIUM --    PT/INR  No results found for this basename: LABPROT:2,INR:2 in the last 72 hours  ABG  No results found for this basename: PHART:2,PCO2:2,PO2:2,HCO3:2 in the last 72 hours   Studies/Results:  Dg Ugi W/water Sol Cm  06/18/2012  *RADIOLOGY REPORT*  Clinical Data: Postoperative for gastric bypass  ESOPHAGUS/WATER SOLUBLE CONTRAST SWALLOW  Technique: 50 ml of oral Omnipaque-300 was administered to the patient, with dedicated visualization of the gastroesophageal junction and gastrojejunostomy.  Comparison:  03/06/2012  Findings: Into separate swallows the 50 ml was administered.  We demonstrate filling of a small gastric pouch with  patent gastrojejunostomy.  Contrast collected in the jejunum in this vicinity appears to be within bowel; no compelling findings of leak noted.  IMPRESSION:  1.  Patent gastrojejunostomy, without specific findings of leak on today's examination.   Original Report Authenticated By: Dellia Cloud, M.D.      Anti-infectives:   Anti-infectives     Start     Dose/Rate Route Frequency Ordered Stop   06/17/12 1900   cefOXitin (MEFOXIN) 1 g in dextrose 5 % 50 mL IVPB        1 g 100 mL/hr over 30 Minutes Intravenous 3 times per day 06/17/12 1716 06/18/12 0648   06/17/12 0954   cefOXitin (MEFOXIN) 2 g in dextrose 5 % 50 mL IVPB        2 g 100 mL/hr over 30 Minutes Intravenous 60 min pre-op 06/17/12 0954 06/17/12 1144          Ovidio Kin, MD, FACS Pager: [phone],   [facility] Office: [phone] 06/19/2012

## 2012-06-27 ENCOUNTER — Encounter (INDEPENDENT_AMBULATORY_CARE_PROVIDER_SITE_OTHER): Payer: Self-pay

## 2012-07-02 ENCOUNTER — Encounter: Payer: BC Managed Care – PPO | Attending: Surgery | Admitting: *Deleted

## 2012-07-02 DIAGNOSIS — Z713 Dietary counseling and surveillance: Secondary | ICD-10-CM | POA: Insufficient documentation

## 2012-07-02 DIAGNOSIS — Z01818 Encounter for other preprocedural examination: Secondary | ICD-10-CM | POA: Insufficient documentation

## 2012-07-02 NOTE — Patient Instructions (Signed)
Patient to follow Phase 3A-Soft, High Protein Diet and follow-up at [facility] in 6 weeks for 2 months post-op nutrition visit for diet advancement. 

## 2012-07-02 NOTE — Progress Notes (Signed)
Bariatric Class:  Appt start time: 1600 end time:  1700.  2 Week Post-Operative Nutrition Class  Patient was seen on 07/02/2012 for Post-Operative Nutrition [facility].   Surgery date: 06/18/11 Surgery type: RYGB Start weight at [facility]: 352.0 lbs  Weight today: 313.5 lbs Weight change: 40.0 lbs Total weight lost: 40.0 lbs BMI: 47.7 kg/m^2  TANITA  BODY COMP RESULTS  02/26/12 06/06/12 07/02/12   %Fat 40.5% 43.3% 45.5%   Fat Mass (lbs) 142.5 153.0 142.5   Fat Free Mass (lbs) 209.5 200.5 171.0   Total Body Water (lbs) 153.5 147.0 125.0   The following the learning objective met the patient during this course:  Identifies Phase 3A (Soft, High Proteins) Dietary Goals and will begin from 2 weeks post-operatively to 2 months post-operatively  Identifies appropriate sources of fluids and proteins   States protein recommendations and appropriate sources post-operatively  Identifies the need for appropriate texture modifications, mastication, and bite sizes when consuming solids  Identifies appropriate multivitamin and calcium sources post-operatively  Describes the need for physical activity post-operatively and will follow MD recommendations  States when to call healthcare provider regarding medication questions or post-operative complications  Handouts given during class include:  Phase 3A: Soft, High Protein Diet Handout  Follow-Up Plan: Patient will follow-up at [facility] in 6 weeks for 2 months post-op nutrition visit for diet advancement per MD.

## 2012-07-03 ENCOUNTER — Ambulatory Visit (INDEPENDENT_AMBULATORY_CARE_PROVIDER_SITE_OTHER): Payer: BC Managed Care – PPO | Admitting: Surgery

## 2012-07-03 ENCOUNTER — Encounter (INDEPENDENT_AMBULATORY_CARE_PROVIDER_SITE_OTHER): Payer: Self-pay | Admitting: Surgery

## 2012-07-03 DIAGNOSIS — Z9884 Bariatric surgery status: Secondary | ICD-10-CM | POA: Insufficient documentation

## 2012-07-03 NOTE — Progress Notes (Signed)
   Re:   [name] DOB:   January 28, 1966 MRN:   [id number]  ASSESSMENT AND PLAN: 1.  Morbid obesity.  Weight 349, BMIT -52.9.  RYGB -  06/17/2012.  First post op visit.  He has done very well,   He will see me back in 3 months. And we will check labs at that time.  2.  Left knee arthritis form injury. 3.  Back and right hip problems.   Chief Complaint  Patient presents with  . Routine Post Op    Roux-En-Y 06/17/12   REFERRING PHYSICIAN:  Goes to [facility] for evaluation.  HISTORY OF PRESENT ILLNESS: [name] is a pleasant 46 y.o. (DOB: 17-Jan-1966)  AA male whose primary care physician is [facility] (he sees several physicians there) and comes to me today for follow up of RYGB.  He has done very well.  He has lost over 30 pounds.  He rode his bike within a week of surgery.  He saw the nutritionist who advanced him to solid protein.  He has no nausea or vomiting.   Past Medical History  Diagnosis Date  . Morbid obesity     BMI 52.9 01/31/12  . Osteoarthritis     L knee  . History of chicken pox   . History of colonic polyps      No current outpatient prescriptions on file.     No Known Allergies  REVIEW OF SYSTEMS: Gastrointestinal:  No history of stomach disease.  No history of liver disease.  No history of gall bladder disease.  No history of pancreas disease.  Colonoscopy in 2010 removed polyp. Unsure of name of colonoscopist. Urologic:  No history of kidney stones.  No history of bladder infections. Musculoskeletal:  Injured left knee playing baseball at 46 years old. Sees [facility]. Oleta Mouse at [facility].  Left knee with "bone on bone", too young for knee replacement. Psych:  Saw Dr. Cyndia Skeeters - 03/30/2012  SOCIAL and FAMILY HISTORY: Works at [employer] center. Married.  Wife works at [employer] call center.  PHYSICAL EXAM: BP 128/66  Pulse 70  Temp 97.4 F (36.3 C) (Temporal)  Resp 16  Ht 5\' 8"  (1.727 m)  Wt 314 lb (142.429 kg)  BMI 47.74  kg/m2  General: AA M who is alert and generally healthy appearing.  Lungs: Clear to auscultation and symmetric breath sounds. Heart:  RRR. No murmur or rub. Abdomen: Soft. No mass. Incisions are well healed.  No tenderness.  DATA REVIEWED: Labs and letters.  Ovidio Kin, MD,  [facility] [address] [place]  [address]   [place], [place]    [address] Phone:  [phone] FAX:  [phone]

## 2012-07-10 ENCOUNTER — Encounter (INDEPENDENT_AMBULATORY_CARE_PROVIDER_SITE_OTHER): Payer: Self-pay

## 2012-07-10 ENCOUNTER — Telehealth (INDEPENDENT_AMBULATORY_CARE_PROVIDER_SITE_OTHER): Payer: Self-pay | Admitting: General Surgery

## 2012-07-10 NOTE — Telephone Encounter (Signed)
Pt calling to ask for another 10 days out of work.  He had RNY surgery on 06/17/12.  He is working out and drinking his protein shakes well.  Reports whenever he eats food, he feels very weak and must rest for up to an hour, so he doesn't think he can return to work on Monday as scheduled.  Please call him with decision.

## 2012-07-10 NOTE — Telephone Encounter (Signed)
I called the pt to let him know Dr Ezzard Standing gave the ok for him to have an additional 2 weeks.  That would put him returning 10/7.  The pt is ok with that and wants a note faxed to [employer] to keep them informed.  I will type up a letter and fax it.

## 2012-07-19 ENCOUNTER — Encounter (INDEPENDENT_AMBULATORY_CARE_PROVIDER_SITE_OTHER): Payer: Self-pay

## 2012-07-24 ENCOUNTER — Ambulatory Visit (INDEPENDENT_AMBULATORY_CARE_PROVIDER_SITE_OTHER): Payer: BC Managed Care – PPO | Admitting: Surgery

## 2012-07-24 ENCOUNTER — Encounter (INDEPENDENT_AMBULATORY_CARE_PROVIDER_SITE_OTHER): Payer: Self-pay | Admitting: Surgery

## 2012-07-24 VITALS — BP 130/90 | HR 82 | Temp 98.2°F | Resp 16 | Ht 68.0 in | Wt 297.2 lb

## 2012-07-24 DIAGNOSIS — Z9884 Bariatric surgery status: Secondary | ICD-10-CM

## 2012-07-24 NOTE — Progress Notes (Addendum)
Re:   [name] DOB:   05/24/1966 MRN:   [id number]  ASSESSMENT AND PLAN: 1.  Morbid obesity.  Weight 349, BMIT -52.9.  RYGB -  06/17/2012.  He's having trouble with po intake.  But he is staying hydated.  Because of that, I want to see him back in 3 weeks.  If he is still having trouble, will consider upper endo.  Weight loss has been good.  He is back to work.  [Patient went to the ER on Saturday, 10/5, because he could not keep things down.  He is still having trouble with more than water. I talked to him on the phone. I will plan to go ahead with an endoscopy with possible dilitation.  DN  07/29/2012] [Mr. Margo Aye has done much better the last 3 days keeping food down and no vomiting.  So I cancelled his upper endo today.  Unless he has more problems, I will see him for his routine 3 month follow up.  DN  08/02/2012]  2.  Left knee arthritis form injury. 3.  Back and right hip problems. 4.  Rash forehead and chest - going on about 2 weeks.   Chief Complaint  Patient presents with  . Bariatric Follow Up    RNY /nausea and vomitting   REFERRING PHYSICIAN:  Goes to [facility] for evaluation.  HISTORY OF PRESENT ILLNESS: [name] is a pleasant 46 y.o. (DOB: 1966-04-03)  AA male whose primary care physician is [facility] (he sees several physicians there) and comes to me today for follow up of RYGB.  Having trouble with po intake.  He can take water and he thinks he is getting enough.  But he cannot tolerate his own saliva.  He also is having trouble with solid food.  He has had good weight loss and has noticed that his joints, hips and back, are better.   He also has a rash which involves his forehead and chest.  This came up about 2 weeks ago.   Past Medical History  Diagnosis Date  . Morbid obesity     BMI 52.9 01/31/12  . Osteoarthritis     L knee  . History of chicken pox   . History of colonic polyps      No current outpatient prescriptions on file.      No Known Allergies  REVIEW OF SYSTEMS: Gastrointestinal:  No history of stomach disease.  No history of liver disease.  No history of gall bladder disease.  No history of pancreas disease.  Colonoscopy in 2010 removed polyp. Unsure of name of colonoscopist. Urologic:  No history of kidney stones.  No history of bladder infections. Musculoskeletal:  Injured left knee playing baseball at 46 years old. Sees Dr. Oleta Mouse at [facility].  Left knee with "bone on bone", too young for knee replacement. Psych:  Saw Dr. Cyndia Skeeters - 03/30/2012  SOCIAL and FAMILY HISTORY: Works at [employer]. Married.  Wife works at [employer] call center.  PHYSICAL EXAM: BP 130/90  Pulse 82  Temp 98.2 F (36.8 C) (Temporal)  Resp 16  Ht 5\' 8"  (1.727 m)  Wt 297 lb 3.2 oz (134.809 kg)  BMI 45.19 kg/m2  SpO2 98%  General: AA M who is alert and generally healthy appearing.  Lungs: Clear to auscultation and symmetric breath sounds. Heart:  RRR. No murmur or rub. Abdomen: Soft. No mass. Incisions are well healed.  No tenderness. Skin:  Rash forehead  and chest.  Pale, mildly pigmented lesions.  He's called it a "heat rash" so far. If it bothers him, he will take some benadryl.  DATA REVIEWED: No new data.  Ovidio Kin, MD,  [facility] [address] [place]  [address]   [place], [place]    [address] Phone:  [phone] FAX:  [phone]

## 2012-07-27 ENCOUNTER — Encounter (HOSPITAL_COMMUNITY): Payer: Self-pay | Admitting: Emergency Medicine

## 2012-07-27 ENCOUNTER — Emergency Department (HOSPITAL_COMMUNITY): Payer: BC Managed Care – PPO

## 2012-07-27 ENCOUNTER — Emergency Department (HOSPITAL_COMMUNITY)
Admission: EM | Admit: 2012-07-27 | Discharge: 2012-07-27 | Disposition: A | Discharge status: Home / Self Care | Payer: BC Managed Care – PPO | Attending: Emergency Medicine | Admitting: Emergency Medicine

## 2012-07-27 DIAGNOSIS — R112 Nausea with vomiting, unspecified: Secondary | ICD-10-CM | POA: Insufficient documentation

## 2012-07-27 DIAGNOSIS — Y838 Other surgical procedures as the cause of abnormal reaction of the patient, or of later complication, without mention of misadventure at the time of the procedure: Secondary | ICD-10-CM | POA: Insufficient documentation

## 2012-07-27 DIAGNOSIS — Z6841 Body Mass Index (BMI) 40.0 and over, adult: Secondary | ICD-10-CM | POA: Insufficient documentation

## 2012-07-27 DIAGNOSIS — Z9884 Bariatric surgery status: Secondary | ICD-10-CM | POA: Insufficient documentation

## 2012-07-27 DIAGNOSIS — IMO0002 Reserved for concepts with insufficient information to code with codable children: Secondary | ICD-10-CM | POA: Insufficient documentation

## 2012-07-27 LAB — URINALYSIS, ROUTINE W REFLEX MICROSCOPIC
Leukocytes, UA: NEGATIVE
Nitrite: NEGATIVE
Specific Gravity, Urine: 1.031 — ABNORMAL HIGH (ref 1.005–1.030)
pH: 6 (ref 5.0–8.0)

## 2012-07-27 LAB — CBC WITH DIFFERENTIAL/PLATELET
Eosinophils Absolute: 0.1 10*3/uL (ref 0.0–0.7)
Eosinophils Relative: 1 % (ref 0–5)
Hemoglobin: 13.7 g/dL (ref 13.0–17.0)
Lymphocytes Relative: 26 % (ref 12–46)
Lymphs Abs: 2.1 10*3/uL (ref 0.7–4.0)
MCH: 28.7 pg (ref 26.0–34.0)
MCV: 82.2 fL (ref 78.0–100.0)
Monocytes Relative: 10 % (ref 3–12)
RBC: 4.77 MIL/uL (ref 4.22–5.81)

## 2012-07-27 LAB — COMPREHENSIVE METABOLIC PANEL
Alkaline Phosphatase: 67 U/L (ref 39–117)
BUN: 11 mg/dL (ref 6–23)
CO2: 21 mEq/L (ref 19–32)
Calcium: 9.8 mg/dL (ref 8.4–10.5)
GFR calc Af Amer: >90 mL/min (ref >90)
GFR calc non Af Amer: >90 mL/min (ref >90)
Glucose, Bld: 75 mg/dL (ref 70–99)
Potassium: 3.6 mEq/L (ref 3.5–5.1)
Total Protein: 6.8 g/dL (ref 6.0–8.3)

## 2012-07-27 LAB — URINE MICROSCOPIC-ADD ON

## 2012-07-27 LAB — MAGNESIUM: Magnesium: 1.6 mg/dL (ref 1.5–2.5)

## 2012-07-27 LAB — LIPASE, BLOOD: Lipase: 31 U/L (ref 11–59)

## 2012-07-27 MED ORDER — ONDANSETRON HCL 4 MG/2ML IJ SOLN
4.0000 mg | Freq: Once | INTRAMUSCULAR | Status: DC
Start: 2012-07-27 — End: 2012-07-28

## 2012-07-27 MED ORDER — ONDANSETRON HCL 4 MG/2ML IJ SOLN
4.0000 mg | Freq: Once | INTRAMUSCULAR | Status: AC
  Administered 2012-07-27: 4 mg via INTRAVENOUS
  Filled 2012-07-27: qty 2

## 2012-07-27 MED ORDER — SODIUM CHLORIDE 0.9 % IV BOLUS (SEPSIS)
1000.0000 mL | Freq: Once | INTRAVENOUS | Status: AC
  Administered 2012-07-27: 1000 mL via INTRAVENOUS

## 2012-07-27 MED ORDER — IOHEXOL 300 MG/ML  SOLN
100.0000 mL | Freq: Once | INTRAMUSCULAR | Status: AC | PRN
  Administered 2012-07-27: 100 mL via INTRAVENOUS

## 2012-07-27 MED ORDER — ONDANSETRON 4 MG PO TBDP: 4.0000 mg | ORAL_TABLET | Freq: Every day | ORAL | Status: DC | PRN

## 2012-07-27 NOTE — ED Provider Notes (Signed)
  Physical Exam  BP 119/87  Pulse 83  Temp 97.7 F (36.5 C) (Oral)  Resp 18  Ht 5\' 8"  (1.727 m)  Wt 289 lb (131.09 kg)  BMI 43.94 kg/m2  SpO2 100%  Physical Exam Asked to review the CT Scan and DC patient  ED Course  Procedures  MDM CT Scan reviewed no obstruction/ post operative abscess      Arman Filter, NP 07/27/12 2332

## 2012-07-27 NOTE — ED Notes (Signed)
D/c IV for d/c home 

## 2012-07-27 NOTE — ED Provider Notes (Signed)
History     CSN: [id number]  Arrival date & time 07/27/12  1725   First MD Initiated Contact with Patient 07/27/12 1902      Chief Complaint  Patient presents with  . Post-op Problem    (Consider location/radiation/quality/duration/timing/severity/associated sxs/prior treatment) HPI  46 y.o. 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 Ezzard Standing 8/26, advanced to soft protein diet several weeks ago which induced vomitting. Patient saw Ezzard Standing 5 days ago and advised him of the nausea and vomiting. Ezzard Standing advised him to start taking liquid vitamin supplements and to followup in 3 weeks. At that time she was not tolerating by mouth the couch may have to be expanded. Patient has not  Started the vitamin supplements. Denies fever, abdmonial pain.   Past Medical History  Diagnosis Date  . Morbid obesity     BMI 52.9 01/31/12  . Osteoarthritis     L knee  . History of chicken pox   . History of colonic polyps     Past Surgical History  Procedure Date  . Breath tek h pylori 02/14/2012    Procedure: BREATH TEK H PYLORI;  Surgeon: Kandis Cocking, MD;  Location: [facility];  Service: General;  Laterality: N/A;  . Knee surgery     Left  . Wrist surgery     LEFT  . Tonsillectomy   . Gastric roux-en-y 06/17/2012    Procedure: LAPAROSCOPIC ROUX-EN-Y GASTRIC;  Surgeon: Kandis Cocking, MD;  Location: [facility];  Service: General;  Laterality: N/A;    Family History  Problem Relation Age of Onset  . Diabetes Mother 24  . Lung cancer Father 43  . Hypertension Father   . Alcohol abuse Father   . Stomach cancer Mother 10  . Hypertension Mother     History  Substance Use Topics  . Smoking status: Never Smoker   . Smokeless tobacco: Never Used   Comment: married, lives with spouse. works with [employer], Clinical biochemist  . Alcohol Use: No      Review of Systems  Constitutional: Negative for fever.  Respiratory: Negative for shortness of breath.     Cardiovascular: Negative for chest pain.  Gastrointestinal: Negative for nausea, vomiting, abdominal pain and diarrhea.  All other systems reviewed and are negative.    Allergies  Review of patient's allergies indicates no known allergies.  Home Medications  No current outpatient prescriptions on file.  BP 119/87  Pulse 83  Temp 97.7 F (36.5 C) (Oral)  Resp 18  Ht 5\' 8"  (1.727 m)  Wt 289 lb (131.09 kg)  BMI 43.94 kg/m2  SpO2 100%  Physical Exam  Nursing note and vitals reviewed. Constitutional: He is oriented to person, place, and time. He appears well-developed and well-nourished. No distress.  HENT:  Head: Normocephalic and atraumatic.  Mouth/Throat: Oropharynx is clear and moist.  Eyes: Conjunctivae normal and EOM are normal. Pupils are equal, round, and reactive to light.  Neck: Normal range of motion.  Cardiovascular: Normal rate, regular rhythm and intact distal pulses.   Pulmonary/Chest: Effort normal and breath sounds normal. No stridor. No respiratory distress. He has no wheezes. He has no rales. He exhibits no tenderness.  Abdominal: Soft. Bowel sounds are normal. He exhibits no distension and no mass. There is no tenderness. There is no rebound and no guarding.  Musculoskeletal: Normal range of motion.  Neurological: He is alert and oriented to person, place, and time.  Skin: Skin is warm.  Psychiatric: He has a normal mood and affect.    ED Course  Procedures (including critical care time)   Labs Reviewed  CBC WITH DIFFERENTIAL  MAGNESIUM  LIPASE, BLOOD  COMPREHENSIVE METABOLIC PANEL  URINALYSIS, ROUTINE W REFLEX MICROSCOPIC   No results found.   No diagnosis found.    MDM  Attending Dr. Rosalia Hammers discussed case with surgeon on call who recommends CT and blood work.  Case signed out to PA Tomasa Blase at shift change.       Wynetta Emery, PA-C 07/27/12 2112

## 2012-07-27 NOTE — ED Notes (Signed)
Pt verbalizes understanding 

## 2012-07-27 NOTE — ED Notes (Signed)
Pt post op Roux En Y 8/26, pt has 64 lb weight loss since Last two weeks unable to tolerate any orals except water, unable to tolerate vitamins or any protein shakes.

## 2012-07-28 NOTE — ED Provider Notes (Signed)
History/physical exam/procedure(s) were performed by non-physician practitioner and as supervising physician I was immediately available for consultation/collaboration. I have reviewed all notes and am in agreement with care and plan.   Hilario Quarry, MD 07/28/12 [phone]

## 2012-07-28 NOTE — ED Provider Notes (Signed)
History/physical exam/procedure(s) were performed by non-physician practitioner and as supervising physician I was immediately available for consultation/collaboration. I have reviewed all notes and am in agreement with care and plan.   Hilario Quarry, MD 07/28/12 [phone]

## 2012-07-29 ENCOUNTER — Other Ambulatory Visit (INDEPENDENT_AMBULATORY_CARE_PROVIDER_SITE_OTHER): Payer: Self-pay | Admitting: Surgery

## 2012-07-30 ENCOUNTER — Other Ambulatory Visit (INDEPENDENT_AMBULATORY_CARE_PROVIDER_SITE_OTHER): Payer: Self-pay | Admitting: Surgery

## 2012-08-02 ENCOUNTER — Encounter (HOSPITAL_COMMUNITY)
Admission: RE | Disposition: A | Discharge status: Home / Self Care | Payer: Self-pay | Source: Ambulatory Visit | Attending: Surgery

## 2012-08-02 ENCOUNTER — Ambulatory Visit (HOSPITAL_COMMUNITY)
Admission: RE | Admit: 2012-08-02 | Discharge: 2012-08-02 | Disposition: A | Discharge status: Home / Self Care | Payer: BC Managed Care – PPO | Source: Ambulatory Visit | Attending: Surgery | Admitting: Surgery

## 2012-08-02 SURGERY — EGD (ESOPHAGOGASTRODUODENOSCOPY)
Anesthesia: Moderate Sedation

## 2012-08-08 ENCOUNTER — Telehealth (INDEPENDENT_AMBULATORY_CARE_PROVIDER_SITE_OTHER): Payer: Self-pay

## 2012-08-09 NOTE — Telephone Encounter (Signed)
Left Msg to call . Ask for me or sara

## 2012-08-12 ENCOUNTER — Telehealth (INDEPENDENT_AMBULATORY_CARE_PROVIDER_SITE_OTHER): Payer: Self-pay

## 2012-08-12 NOTE — Telephone Encounter (Signed)
I left a message for the pt to call me.  I want to check on him and see how he is doing and see if he needs to come in Wednesday.

## 2012-08-13 ENCOUNTER — Encounter: Payer: Self-pay | Admitting: *Deleted

## 2012-08-13 ENCOUNTER — Encounter: Payer: BC Managed Care – PPO | Attending: Surgery | Admitting: *Deleted

## 2012-08-13 DIAGNOSIS — Z713 Dietary counseling and surveillance: Secondary | ICD-10-CM | POA: Insufficient documentation

## 2012-08-13 DIAGNOSIS — Z01818 Encounter for other preprocedural examination: Secondary | ICD-10-CM | POA: Insufficient documentation

## 2012-08-13 NOTE — Progress Notes (Signed)
  Follow-up visit:  8 Weeks Post-Operative RYGB Surgery  Medical Nutrition Therapy:  Appt start time: 1245  End time:  1315.  Primary concerns today: Post-operative Bariatric Surgery Nutrition Management. [name] returns today for 2 mo f/u with 26.5 lb wt loss (41 lb loss of FAT MASS) since last visit.Visit to ER on 10/5 d/t trouble with PO intake. Upper GI cancelled d/t pt feeling better soon after. Doing better now, but still taking Zofran prn. Protein intake is down, though states he is working on increasing. Otherwise, doing well and very happy with weight loss thus far.   Surgery date: 06/18/11 Surgery type: RYGB Start weight at [facility]: 352.0 lbs  Weight today: 287.0 lbs Weight change: 26.5 lbs Total weight lost: 66.5 lbs BMI: 43.6 kg/m^2  Goal weight: 220 lbs % goal met: 50%  TANITA  BODY COMP RESULTS  02/26/12 06/06/12 07/02/12 08/13/12   %Fat 40.5% 43.3% 45.5% 35.4%   Fat Mass (lbs) 142.5 153.0 142.5 101.5   Fat Free Mass (lbs) 209.5 200.5 171.0 185.5   Total Body Water (lbs) 153.5 147.0 125.0 136.0   Fluid intake: >64 oz Estimated total protein intake: 40 g - Discussed increasing to 80g.  Medications:  Zofran for nausea Supplementation: Taking as directed  Using straws: No Drinking while eating: No Hair loss: No Carbonated beverages: No N/V/D/C: Some residual nausea (see above) Dumping syndrome: None  Recent physical activity:  Usual: 3 miles/bike, 15 min tread, 15 min rowing mach - 3 to 5 times/week. None last 3 weeks per MD d/t N/V.   Progress Towards Goal(s):  In progress.  Handouts given during visit include:  Phase 3B: High Protein + Non-Starchy Vegetables   Nutritional Diagnosis:  NI-5.7.1 Inadequate protein intake related to small portions of lean protein foods s/p RYGB surgery as evidenced by patient consuming 50% of estimated post-op protein needs.    Intervention:  Nutrition education/diet advancement.  Monitoring/Evaluation:  Dietary intake, exercise,  and body weight. Follow up in 1 months for 3 month post-op visit.

## 2012-08-13 NOTE — Patient Instructions (Addendum)
Goals:  Follow Phase 3B: High Protein + Non-Starchy Vegetables  Eat 3-6 small meals/snacks, every 3-5 hrs  Increase lean protein foods to meet 80g goal  Increase fluid intake to 64oz +  Avoid drinking 15 minutes before, during and 30 minutes after eating  Aim for >30 min of physical activity daily  TANITA  BODY COMP RESULTS    02/26/12  06/06/12 07/02/12 08/13/12  %Fat   40.5%  43.3%  45.5%  35.4%  Fat Mass (lbs) 142.5  153.0  142.5  101.5  Fat Free Mass (lbs) 209.5  200.5  171.0  185.5  Total Body Water (lbs) 153.5  147.0  125.0  136.0

## 2012-08-14 ENCOUNTER — Encounter (INDEPENDENT_AMBULATORY_CARE_PROVIDER_SITE_OTHER): Payer: BC Managed Care – PPO | Admitting: Surgery

## 2012-08-15 ENCOUNTER — Other Ambulatory Visit (INDEPENDENT_AMBULATORY_CARE_PROVIDER_SITE_OTHER): Payer: Self-pay

## 2012-08-15 DIAGNOSIS — E669 Obesity, unspecified: Secondary | ICD-10-CM

## 2012-08-28 ENCOUNTER — Telehealth (INDEPENDENT_AMBULATORY_CARE_PROVIDER_SITE_OTHER): Payer: Self-pay | Admitting: General Surgery

## 2012-08-28 NOTE — Telephone Encounter (Signed)
Pt called to ask if OK to use an OTC enema.  He is trying to have a BM, but it is very hard and "stuck."  Gave him the OK to try this to rehydrate the stool for passing.

## 2012-09-25 ENCOUNTER — Ambulatory Visit (INDEPENDENT_AMBULATORY_CARE_PROVIDER_SITE_OTHER): Payer: BC Managed Care – PPO | Admitting: Surgery

## 2012-09-25 VITALS — BP 136/82 | HR 62 | Temp 97.0°F | Resp 18 | Ht 68.0 in | Wt 270.0 lb

## 2012-09-25 DIAGNOSIS — Z9884 Bariatric surgery status: Secondary | ICD-10-CM

## 2012-09-25 NOTE — Progress Notes (Signed)
   Re:   [name] DOB:   12/02/1965 MRN:   [id number]  ASSESSMENT AND PLAN: 1.  Morbid obesity.  Weight 349, BMIT -52.9.  RYGB -  06/17/2012.  Early problems with nausea have resolved.  Good weight loss of about 70+ pounds.  Doing well, except he did not get his lab drawn.  I will see him back in 3 months.  2.  Left knee arthritis form injury. 3.  Back and right hip problems.  The hip is bothering him and he is going back to his ortho - Dr. Prince Rome.  He also sees a [facility], Dr. Mayford Knife. 4.  Constipation problem which kept him out of work for 3 days in October  That is better now. 5.  Going to an "in vitro clinic" with his wife  He had lab work done, which he is gong to get to [place].   Chief Complaint  Patient presents with  . Bariatric Follow Up   REFERRING PHYSICIAN:  Goes to [facility] for evaluation.  HISTORY OF PRESENT ILLNESS: [name] is a pleasant 46 y.o. (DOB: [birth year])  AA male whose primary care physician is [facility] (he sees several physicians there) and comes to me today for follow up of RYGB.  He is doing much better than when I last saw him.  His nausea has resolved.  His rash has resolved.  He is exercising about 30 to 40 minutes about 3 x per week - stationary bike and rowing.  His bowels have settled down.  Past Medical History  Diagnosis Date  . Morbid obesity     BMI 52.9 01/31/12  . Osteoarthritis     L knee  . History of chicken pox   . History of colonic polyps      Current Outpatient Prescriptions  Medication Sig Dispense Refill  . ondansetron (ZOFRAN ODT) 4 MG disintegrating tablet Take 1 tablet (4 mg total) by mouth 5 (five) times daily as needed for nausea.  20 tablet  0     No Known Allergies  REVIEW OF SYSTEMS: Gastrointestinal:  No history of stomach disease.  No history of liver disease.  No history of gall bladder disease.  No history of pancreas disease.  Colonoscopy in 2010 removed polyp. Unsure of name of  colonoscopist. Urologic:  No history of kidney stones.  No history of bladder infections. Musculoskeletal:  Injured left knee playing baseball at 46 years old. Sees Dr. Oleta Mouse at [facility].  Left knee with "bone on bone", too young for knee replacement. Psych:  Saw Dr. Cyndia Skeeters - 03/30/2012  SOCIAL and FAMILY HISTORY: Works at [employer]. Married.  Wife works at [employer] call center.  PHYSICAL EXAM: BP 136/82  Pulse 62  Temp 97 F (36.1 C)  Resp 18  Ht 5\' 8"  (1.727 m)  Wt 270 lb (122.471 kg)  BMI 41.05 kg/m2  General: AA M who is alert and generally healthy appearing.  Lungs: Clear to auscultation and symmetric breath sounds. Heart:  RRR. No murmur or rub. Abdomen: Soft. No mass. Incisions are well healed.  No tenderness.  He has a moderate amount of redundant skin.  DATA REVIEWED: I reviewed some of the labs he has in the computer.  Ovidio Kin, MD,  [facility] [address] [place]  [address]   [place], [place]    [address] Phone:  [phone] FAX:  [phone]

## 2012-10-09 ENCOUNTER — Telehealth (INDEPENDENT_AMBULATORY_CARE_PROVIDER_SITE_OTHER): Payer: Self-pay

## 2012-10-09 NOTE — Telephone Encounter (Signed)
Voice mail - We do not have a copy of his last labs ; please refax or call with  Lab location so we can call for them

## 2012-10-17 ENCOUNTER — Encounter (INDEPENDENT_AMBULATORY_CARE_PROVIDER_SITE_OTHER): Payer: Self-pay

## 2012-11-28 ENCOUNTER — Telehealth (INDEPENDENT_AMBULATORY_CARE_PROVIDER_SITE_OTHER): Payer: Self-pay

## 2012-11-28 NOTE — Telephone Encounter (Signed)
F/U call x 4 to reschedule RNY F/U ; Informed patient RNY labs are needed.  Advised to call .

## 2013-04-22 ENCOUNTER — Other Ambulatory Visit (INDEPENDENT_AMBULATORY_CARE_PROVIDER_SITE_OTHER): Payer: BC Managed Care – PPO

## 2013-04-22 ENCOUNTER — Encounter: Payer: Self-pay | Admitting: Internal Medicine

## 2013-04-22 ENCOUNTER — Ambulatory Visit (INDEPENDENT_AMBULATORY_CARE_PROVIDER_SITE_OTHER): Payer: BC Managed Care – PPO | Admitting: Internal Medicine

## 2013-04-22 VITALS — BP 120/70 | HR 66 | Temp 98.6°F | Ht 68.0 in | Wt 209.0 lb

## 2013-04-22 DIAGNOSIS — Z23 Encounter for immunization: Secondary | ICD-10-CM

## 2013-04-22 DIAGNOSIS — Z0000 Encounter for general adult medical examination without abnormal findings: Secondary | ICD-10-CM

## 2013-04-22 DIAGNOSIS — Z9884 Bariatric surgery status: Secondary | ICD-10-CM

## 2013-04-22 DIAGNOSIS — K644 Residual hemorrhoidal skin tags: Secondary | ICD-10-CM

## 2013-04-22 LAB — HEPATIC FUNCTION PANEL
Bilirubin, Direct: 0.1 mg/dL (ref 0.0–0.3)
Total Bilirubin: 0.9 mg/dL (ref 0.3–1.2)
Total Protein: 6.6 g/dL (ref 6.0–8.3)

## 2013-04-22 LAB — URINALYSIS, ROUTINE W REFLEX MICROSCOPIC
Bilirubin Urine: NEGATIVE
Hgb urine dipstick: NEGATIVE
Total Protein, Urine: NEGATIVE
Urine Glucose: NEGATIVE

## 2013-04-22 LAB — BASIC METABOLIC PANEL
BUN: 16 mg/dL (ref 6–23)
Chloride: 108 mEq/L (ref 96–112)
Potassium: 3.8 mEq/L (ref 3.5–5.1)

## 2013-04-22 LAB — CBC WITH DIFFERENTIAL/PLATELET
Basophils Relative: 0.3 % (ref 0.0–3.0)
Eosinophils Relative: 1.3 % (ref 0.0–5.0)
HCT: 35.3 % — ABNORMAL LOW (ref 39.0–52.0)
Lymphs Abs: 1.3 10*3/uL (ref 0.7–4.0)
MCV: 88.2 fl (ref 78.0–100.0)
Monocytes Absolute: 0.4 10*3/uL (ref 0.1–1.0)
Platelets: 173 10*3/uL (ref 150.0–400.0)
RBC: 4 Mil/uL — ABNORMAL LOW (ref 4.22–5.81)
WBC: 4.5 10*3/uL (ref 4.5–10.5)

## 2013-04-22 LAB — LIPID PANEL
Cholesterol: 109 mg/dL (ref 0–200)
LDL Cholesterol: 56 mg/dL (ref 0–99)
Total CHOL/HDL Ratio: 2
VLDL: 7.2 mg/dL (ref 0.0–40.0)

## 2013-04-22 LAB — IBC PANEL
Iron: 109 ug/dL (ref 42–165)
Transferrin: 173.5 mg/dL — ABNORMAL LOW (ref 212.0–360.0)

## 2013-04-22 LAB — MAGNESIUM: Magnesium: 2 mg/dL (ref 1.5–2.5)

## 2013-04-22 LAB — FOLATE: Folate: 3.3 ng/mL — ABNORMAL LOW (ref >5.9)

## 2013-04-22 MED ORDER — HYDROCORTISONE ACE-PRAMOXINE 2.5-1 % RE CREA: TOPICAL_CREAM | Freq: Three times a day (TID) | RECTAL | Status: DC

## 2013-04-22 NOTE — Patient Instructions (Addendum)
It was good to see you today. We have reviewed your prior records including labs and tests today Health Maintenance reviewed -Tdap updated today - all recommended immunizations and age-appropriate screenings are up-to-date. Test(s) ordered today. Your results will be released to MyChart (or called to you) after review, usually within 72hours after test completion. If any changes need to be made, you will be notified at that same time. Prescription hemorrhoid cream to use 3 times daily as needed - avoid straining and constipation, call if continued pain and problems Please schedule followup in 12 months for annual visit and labs, call sooner if problems.  Health Maintenance, Males A healthy lifestyle and preventative care can promote health and wellness.  Maintain regular health, dental, and eye exams.  Eat a healthy diet. Foods like vegetables, fruits, whole grains, low-fat dairy products, and lean protein foods contain the nutrients you need without too many calories. Decrease your intake of foods high in solid fats, added sugars, and salt. Get information about a proper diet from your caregiver, if necessary.  Regular physical exercise is one of the most important things you can do for your health. Most adults should get at least 150 minutes of moderate-intensity exercise (any activity that increases your heart rate and causes you to sweat) each week. In addition, most adults need muscle-strengthening exercises on 2 or more days a week.   Maintain a healthy weight. The body mass index (BMI) is a screening tool to identify possible weight problems. It provides an estimate of body fat based on height and weight. Your caregiver can help determine your BMI, and can help you achieve or maintain a healthy weight. For adults 20 years and older:  A BMI below 18.5 is considered underweight.  A BMI of 18.5 to 24.9 is normal.  A BMI of 25 to 29.9 is considered overweight.  A BMI of 30 and above is  considered obese.  Maintain normal blood lipids and cholesterol by exercising and minimizing your intake of saturated fat. Eat a balanced diet with plenty of fruits and vegetables. Blood tests for lipids and cholesterol should begin at age 34 and be repeated every 5 years. If your lipid or cholesterol levels are high, you are over 50, or you are a high risk for heart disease, you may need your cholesterol levels checked more frequently.Ongoing high lipid and cholesterol levels should be treated with medicines, if diet and exercise are not effective.  If you smoke, find out from your caregiver how to quit. If you do not use tobacco, do not start.  If you choose to drink alcohol, do not exceed 2 drinks per day. One drink is considered to be 12 ounces (355 mL) of beer, 5 ounces (148 mL) of wine, or 1.5 ounces (44 mL) of liquor.  Avoid use of street drugs. Do not share needles with anyone. Ask for help if you need support or instructions about stopping the use of drugs.  High blood pressure causes heart disease and increases the risk of stroke. Blood pressure should be checked at least every 1 to 2 years. Ongoing high blood pressure should be treated with medicines if weight loss and exercise are not effective.  If you are 34 to 45 years old, ask your caregiver if you should take aspirin to prevent heart disease.  Diabetes screening involves taking a blood sample to check your fasting blood sugar level. This should be done once every 3 years, after age 18, if you are within normal  weight and without risk factors for diabetes. Testing should be considered at a younger age or be carried out more frequently if you are overweight and have at least 1 risk factor for diabetes.  Colorectal cancer can be detected and often prevented. Most routine colorectal cancer screening begins at the age of 81 and continues through age 78. However, your caregiver may recommend screening at an earlier age if you have risk  factors for colon cancer. On a yearly basis, your caregiver may provide home test kits to check for hidden blood in the stool. Use of a small camera at the end of a tube, to directly examine the colon (sigmoidoscopy or colonoscopy), can detect the earliest forms of colorectal cancer. Talk to your caregiver about this at age 44, when routine screening begins. Direct examination of the colon should be repeated every 5 to 10 years through age 39, unless early forms of pre-cancerous polyps or small growths are found.  Hepatitis C blood testing is recommended for all people born from [birth year] through 1963 and any individual with known risks for hepatitis C.  Healthy men should no longer receive prostate-specific antigen (PSA) blood tests as part of routine cancer screening. Consult with your caregiver about prostate cancer screening.  Testicular cancer screening is not recommended for adolescents or adult males who have no symptoms. Screening includes self-exam, caregiver exam, and other screening tests. Consult with your caregiver about any symptoms you have or any concerns you have about testicular cancer.  Practice safe sex. Use condoms and avoid high-risk sexual practices to reduce the spread of sexually transmitted infections (STIs).  Use sunscreen with a sun protection factor (SPF) of 30 or greater. Apply sunscreen liberally and repeatedly throughout the day. You should seek shade when your shadow is shorter than you. Protect yourself by wearing long sleeves, pants, a wide-brimmed hat, and sunglasses year round, whenever you are outdoors.  Notify your caregiver of new moles or changes in moles, especially if there is a change in shape or color. Also notify your caregiver if a mole is larger than the size of a pencil eraser.  A one-time screening for abdominal aortic aneurysm (AAA) and surgical repair of large AAAs by sound wave imaging (ultrasonography) is recommended for ages 46 to 77 years who are  current or former smokers.  Stay current with your immunizations. Document Released: 04/06/2008 Document Revised: 01/01/2012 Document Reviewed: 03/06/2011 [facility] Patient Information 2014 [place], [place]. Hemorrhoids Hemorrhoids are puffy (swollen) veins around the rectum or anus. Hemorrhoids can cause pain, itching, bleeding, or irritation. HOME CARE  Eat foods with fiber, such as whole grains, beans, nuts, fruits, and vegetables. Ask your doctor about taking products with added fiber in them (fibersupplements).  Drink enough fluid to keep your pee (urine) clear or pale yellow.  Exercise often.  Go to the bathroom when you have the urge to poop. Do not wait.  Avoid straining to poop (bowel movement).  Keep the butt area dry and clean. Use wet toilet paper or moist paper towels.  Medicated creams and medicine inserted into the anus (anal suppository) may be used or applied as told.  Only take medicine as told by your doctor.  Take a warm water bath (sitz bath) for 15 20 minutes to ease pain. Do this 3 4 times a day.  Place ice packs on the area if it is tender or puffy. Use the ice packs between the warm water baths.  Put ice in a plastic bag.  Place  a towel between your skin and the bag.  Leave the ice on for 15-20 minutes, 3-4 times a day.  Do not use a donut-shaped pillow or sit on the toilet for a long time. GET HELP RIGHT AWAY IF:   You have more pain that is not controlled by treatment or medicine.  You have bleeding that will not stop.  You have trouble or are unable to poop (bowel movement).  You have pain or puffiness outside the area of the hemorrhoids. MAKE SURE YOU:   Understand these instructions.  Will watch your condition.  Will get help right away if you are not doing well or get worse. Document Released: 07/18/2008 Document Revised: 09/25/2012 Document Reviewed: 08/20/2012 [facility] Patient Information 2014 [place], [place].

## 2013-04-22 NOTE — Progress Notes (Signed)
Subjective:    Patient ID: [name], male    DOB: 02/13/1966, 47 y.o.   MRN: [id number]  HPI  patient is here today for annual physical. Patient feels well overall  also reviewed chronic medical issues interval medical events -  Left knee osteoarthritis, traumatic -pain began in HS following L knee injury (1988) Working with Dr Jorge Mandril on same - wears brace for support, no plans for TKR at this time Pain controlled with OTC meds prn  Snoring - resolved since weight loss 05/2012  complains of rectal "pain" with sitting - ongoing x 3 days - consistent with prior hemorrhoid problem, no active bleeding - not yet improved with OTC ointment   Past Medical History  Diagnosis Date  . 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 Osteoarthritis     L knee  . History of chicken pox   . History of colonic polyps    Family History  Problem Relation Age of Onset  . Diabetes Mother 39  . Lung cancer Father 74  . Hypertension Father   . Alcohol abuse Father   . Stomach cancer Mother 34  . Hypertension Mother    History  Substance Use Topics  . Smoking status: Never Smoker   . Smokeless tobacco: Never Used     Comment: married, lives with spouse. works with [employer], Clinical biochemist  . Alcohol Use: No    Review of Systems  Constitutional: Negative for fever or fatigue.  Respiratory: Negative for cough and shortness of breath.   Cardiovascular: Negative for chest pain or palpitations.  Gastrointestinal: Negative for abdominal pain, no bowel changes.  Musculoskeletal: Negative for gait problem - occasional L knee joint swelling.  Skin: Negative for rash.  Neurological: Negative for dizziness or headache.  No other specific complaints in a complete review of systems (except as listed in HPI above).     Objective:   Physical Exam   BP 120/70  Pulse 66  Temp(Src) 98.6 F (37 C) (Oral)  Ht 5\' 8"  (1.727 m)  Wt 209 lb (94.802 kg)  BMI 31.79 kg/m2   SpO2 97% Wt Readings from Last 3 Encounters:  04/22/13 209 lb (94.802 kg)  09/25/12 270 lb (122.471 kg)  08/13/12 287 lb (130.182 kg)   Constitutional: he appears well-developed and well-nourished. No distress.  HENT: Head: Normocephalic and atraumatic. Ears: B TMs ok, no erythema or effusion; Nose: Nose normal. Mouth/Throat: Oropharynx is clear and moist. No oropharyngeal exudate.  Eyes: Conjunctivae and EOM are normal. Pupils are equal, round, and reactive to light. No scleral icterus.  Neck: Normal range of motion. Neck supple. No JVD present. No thyromegaly present.  Cardiovascular: Normal rate, regular rhythm and normal heart sounds.  No murmur heard. No BLE edema. Pulmonary/Chest: Effort normal and breath sounds normal. No respiratory distress. he has no wheezes.  Abdominal: Soft. Bowel sounds are normal. he exhibits no distension. There is no tenderness. no masses Rectal: performed by PA student and supervised by me - observation shows soft noninflamed external hemorrhoid tissue at 12:00 position. No pain or evidence of anal fissure. Soft brown stool present in vault, heme-negative. Prostate normal size without tenderness bogginess or nodularity Musculoskeletal: marked valgus deformities BLE. L knee - boggy synovitis - tender to palpation over joint line; FROM and ligamentous function intact. Otherwise normal range of motion, no joint effusions. No gross deformities Neurological: he is alert and oriented to person, place, and time. No cranial  nerve deficit. Coordination, balance, strength, speech and gait are normal.  Skin: Excess skin B thighs. Skin is warm and dry. No rash noted. No erythema.  Psychiatric: he has a normal mood and affect. behavior is normal. Judgment and thought content normal.  Lab Results  Component Value Date   WBC 8.2 07/27/2012   HGB 13.7 07/27/2012   HCT 39.2 07/27/2012   PLT 154 07/27/2012   GLUCOSE 75 07/27/2012   ALT 16 07/27/2012   AST 16 07/27/2012   NA 137  07/27/2012   K 3.6 07/27/2012   CL 94* 07/27/2012   CREATININE 0.86 07/27/2012   BUN 11 07/27/2012   CO2 21 07/27/2012   TSH 2.810 01/31/2012       Assessment & Plan:  CPX/v70.0 - Patient has been counseled on age-appropriate routine health concerns for screening and prevention. These are reviewed and up-to-date. Immunizations are up-to-date or declined. Labs ordered and reviewed.  External hemorrhoid - noninflamed, history of same. Advised symptomatic conservative care and prescription ointment as needed for discomfort. If persisting or increasing problems, patient will call for referral to GI for consideration of banding as needed  Also see problem list. Medications and labs reviewed today.

## 2013-04-22 NOTE — Addendum Note (Signed)
Addended by: Deatra James on: 04/22/2013 09:37 AM   Modules accepted: Orders

## 2013-04-22 NOTE — Assessment & Plan Note (Signed)
Weight loss trends reviewed since surgery August 2013 Following with bariatric surgery since, overall doing well We'll check labs regarding absorption and nutrition in addition to CPX labs as above and forward to Dr. Ezzard Standing for review

## 2013-04-23 ENCOUNTER — Telehealth: Payer: Self-pay | Admitting: *Deleted

## 2013-04-23 NOTE — Telephone Encounter (Signed)
Spoke with pt, advised him of lab results as per MD note.

## 2013-06-18 ENCOUNTER — Ambulatory Visit: Payer: Self-pay | Admitting: Dietician

## 2013-07-23 ENCOUNTER — Encounter (INDEPENDENT_AMBULATORY_CARE_PROVIDER_SITE_OTHER): Payer: Self-pay | Admitting: Surgery

## 2013-07-23 ENCOUNTER — Ambulatory Visit (INDEPENDENT_AMBULATORY_CARE_PROVIDER_SITE_OTHER): Payer: BC Managed Care – PPO | Admitting: Surgery

## 2013-07-23 VITALS — BP 118/68 | HR 72 | Temp 97.0°F | Resp 16 | Ht 68.0 in | Wt 200.2 lb

## 2013-07-23 DIAGNOSIS — Z9884 Bariatric surgery status: Secondary | ICD-10-CM

## 2013-07-23 DIAGNOSIS — Z6841 Body Mass Index (BMI) 40.0 and over, adult: Secondary | ICD-10-CM

## 2013-07-23 NOTE — Progress Notes (Signed)
Re:   [name] DOB:   11/22/1965 MRN:   [id number]  ASSESSMENT AND PLAN: 1.  Morbid obesity.  Weight 349, BMIT -52.9.  RYGB -  06/17/2012.  Has done very well with weight loss.  He is at a BMI of 30.  We talked about his excess skin and I gave him the name of several plastic surgeons.  Return appointment in one year.  2.  Left knee arthritis form injury.  His left knee is still giving him problems. 3.  Back and right hip problems.  The hip is bothering him and he is going back to his ortho - Dr. Prince Rhodes.  He also sees a [name], Dr. Mayford Rhodes. 4.  Expecting son - 08/01/2013 (first child) 5.  Slightly decreased folate - he was aware of this.  He has tried to make sure he takes his vitamins.  Chief Complaint  Patient presents with  . Bariatric Follow Up   HISTORY OF PRESENT ILLNESS: [name] is a pleasant 47 y.o. (DOB: Jul 15, 1966)  AA male whose primary care physician is Dr. Felicity Rhodes and comes to me today for follow up of RYGB.  Overall [name] has done very well with his gastric bypass.  His weight is down admirably.  He is soon to be a father.  His left knee is continuing to give him problems and his is to see Dr. Prince Rhodes later today.  He has had some recent trouble with hemorrhoids.  But these were treated medically and he has done well - so I did not examine his rectum.  Labs (04/22/2013) - TSH - 1.47, Cholesterol - 109, LFT's okay, Hgb - 11.8, Folate - 3.3 (low),  B12 - 257, Fe- 109 (I gave him copies of the labs)  Past Medical History  Diagnosis Date  . 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 Osteoarthritis     L knee  . History of chicken pox   . History of colonic polyps      Current Outpatient Prescriptions  Medication Sig Dispense Refill  . hydrocortisone-pramoxine (ANALPRAM-HC) 2.5-1 % rectal cream Place rectally 3 (three) times daily.  30 g  0   No current facility-administered medications for this visit.     No Known  Allergies  REVIEW OF SYSTEMS: Gastrointestinal:  No history of stomach disease.  No history of liver disease.  No history of gall bladder disease.  No history of pancreas disease.  Colonoscopy in 2010 removed polyp. Unsure of name of colonoscopist. Urologic:  No history of kidney stones.  No history of bladder infections. Musculoskeletal:  Injured left knee playing baseball at 47 years old. Sees [name] at [facility].  Left knee with "bone on bone", too young for knee replacement. Psych:  Saw Dr. Cyndia Rhodes - 03/30/2012  SOCIAL and FAMILY HISTORY: Works at [employer].  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 Married.  Wife works at [employer] call center. He is expecting first child - son - on 08/01/2013  PHYSICAL EXAM: BP 118/68  Pulse 72  Temp(Src) 97 F (36.1 C) (Temporal)  Resp 16  Ht 5\' 8"  (1.727 m)  Wt 200 lb 3.2 oz (90.81 kg)  BMI 30.45 kg/m2  General: AA M who is alert and generally healthy appearing.  Lungs: Clear to auscultation and symmetric breath sounds. Heart:  RRR. No murmur or rub. Abdomen: Soft. No mass. Incisions are well healed.  No tenderness.  He has redundant skin on his arms, abdomen, legs. Extremities: He has the angular changes in his left knee.  DATA REVIEWED: I gave him copies of his labs from 04/22/2013.  Anthony Kin, MD,  [facility] [address] [place]  [address]   [place], [place]    [address] Phone:  [phone] FAX:  [phone]

## 2013-08-18 ENCOUNTER — Encounter: Payer: Self-pay | Admitting: Internal Medicine

## 2013-08-18 ENCOUNTER — Ambulatory Visit (INDEPENDENT_AMBULATORY_CARE_PROVIDER_SITE_OTHER): Payer: BC Managed Care – PPO | Admitting: Internal Medicine

## 2013-08-18 VITALS — BP 120/68 | HR 85 | Temp 98.1°F | Wt 192.0 lb

## 2013-08-18 DIAGNOSIS — Z23 Encounter for immunization: Secondary | ICD-10-CM

## 2013-08-18 DIAGNOSIS — M1710 Unilateral primary osteoarthritis, unspecified knee: Secondary | ICD-10-CM

## 2013-08-18 DIAGNOSIS — Z0000 Encounter for general adult medical examination without abnormal findings: Secondary | ICD-10-CM

## 2013-08-18 NOTE — Progress Notes (Signed)
Pre-visit discussion using our clinic review tool. No additional management support is needed unless otherwise documented below in the visit note.  

## 2013-08-18 NOTE — Patient Instructions (Signed)
It was good to see you today.  Your annual flu shot was given and/or updated today.  we'll make referral to Dr Turner Daniels to consider left total knee replacement as discussed . Our office will contact you regarding appointment(s) once made.  Dr. Turner Daniels will discuss with you details of surgical timing with FMLA paperwork and short-term disability process  You have been evaluated today and it is felt that the surgical risk is low and outweighed by the potential benefit of the surgery. Therefore, you are medically clear to proceed whenever scheduling allows.

## 2013-08-18 NOTE — Assessment & Plan Note (Signed)
Chronic left knee problems, traumatic due to history of prior fracture Has followed with sports medicine periodically over the years Currently, increasing pain including nocturnal symptoms. No effusion but increase locking sensation Will refer to orthopedics to consider total knee replacement as previously advised given successful weight loss, prior BMI > 50, now 30 following bariatric intervention 05/2012 Pt request Dr. Turner Daniels

## 2013-08-18 NOTE — Progress Notes (Signed)
  Subjective:    Patient ID: [name], male    DOB: 01-02-1966, 47 y.o.   MRN: [id number]  HPI  Patient here today for follow up of left knee pain.  He has been evaluated in the past by Dr Burnis Medin with sports medicine.  He reports that his pain has been increasingly worse with locking.  Knee swelling at the end of the day.  Pt had prior traumatic injury to left femur at age 29 with pin placement and orthopedic surgery at that time.  Obesity - pt with bypass surgery 05/2012 with significant weight loss since that time.  Pt with continued efforts at diet and exercise.   Past Medical History  Diagnosis Date  . 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 Osteoarthritis     L knee  . History of chicken pox   . History of colonic polyps     Review of Systems  Constitutional: Negative for fever and chills.  Respiratory: Negative for chest tightness and shortness of breath.   Cardiovascular: Negative for chest pain and palpitations.  Musculoskeletal: Positive for joint swelling.       Left knee pain and joint swelling.        Objective:   Physical Exam  Constitutional: He is oriented to person, place, and time. He appears well-developed and well-nourished. No distress.  Cardiovascular: Normal rate, regular rhythm and normal heart sounds.   Pulmonary/Chest: Effort normal and breath sounds normal. No respiratory distress.  Musculoskeletal:  Left knee with gross deformity from prior injury -pain to palpation lateral>medial side, small effusion noted. Popping but no locking on ROM. Significant varus deformity BLE.  Neurological: He is alert and oriented to person, place, and time.  Skin: Skin is warm and dry. He is not diaphoretic.  Psychiatric: He has a normal mood and affect. His behavior is normal. Judgment and thought content normal.     Wt Readings from Last 3 Encounters:  08/18/13 192 lb (87.091 kg)  07/23/13 200 lb 3.2 oz (90.81 kg)  04/22/13 209 lb (94.802 kg)    BP Readings from Last 3 Encounters:  08/18/13 120/68  07/23/13 118/68  04/22/13 120/70   Lab Results  Component Value Date   WBC 4.5 04/22/2013   HGB 11.8* 04/22/2013   HCT 35.3* 04/22/2013   PLT 173.0 04/22/2013   GLUCOSE 85 04/22/2013   CHOL 109 04/22/2013   TRIG 36.0 04/22/2013   HDL 45.70 04/22/2013   LDLCALC 56 04/22/2013   ALT 34 04/22/2013   AST 26 04/22/2013   NA 141 04/22/2013   K 3.8 04/22/2013   CL 108 04/22/2013   CREATININE 0.9 04/22/2013   BUN 16 04/22/2013   CO2 26 04/22/2013   TSH 1.47 04/22/2013   PSA 1.60 04/22/2013       Assessment & Plan:   See problem list. Medications and labs reviewed today.  This patient has been evaluated and it is felt that the surgical risk is low and outweighed by the potential benefit of the surgery. Therefore, medically clear to proceed when scheduling allows.

## 2013-08-18 NOTE — Assessment & Plan Note (Signed)
Wt Readings from Last 3 Encounters:  08/18/13 192 lb (87.091 kg)  07/23/13 200 lb 3.2 oz (90.81 kg)  04/22/13 209 lb (94.802 kg)   s/p Roux-en-Y 05/2013 to assist in weight loss goals - successful Start BMI 54, now BMI 30 Encouragement offered today

## 2013-08-28 ENCOUNTER — Other Ambulatory Visit: Payer: Self-pay | Admitting: Orthopedic Surgery

## 2013-09-03 ENCOUNTER — Encounter (HOSPITAL_COMMUNITY): Payer: Self-pay | Admitting: Pharmacy Technician

## 2013-09-05 ENCOUNTER — Ambulatory Visit (HOSPITAL_COMMUNITY)
Admission: RE | Admit: 2013-09-05 | Discharge: 2013-09-05 | Disposition: A | Discharge status: Home / Self Care | Payer: BC Managed Care – PPO | Source: Ambulatory Visit | Attending: Orthopedic Surgery | Admitting: Orthopedic Surgery

## 2013-09-05 ENCOUNTER — Encounter (HOSPITAL_COMMUNITY): Payer: Self-pay

## 2013-09-05 ENCOUNTER — Encounter (HOSPITAL_COMMUNITY)
Admission: RE | Admit: 2013-09-05 | Discharge: 2013-09-05 | Disposition: A | Discharge status: Home / Self Care | Payer: BC Managed Care – PPO | Source: Ambulatory Visit | Attending: Orthopedic Surgery | Admitting: Orthopedic Surgery

## 2013-09-05 DIAGNOSIS — Z01818 Encounter for other preprocedural examination: Secondary | ICD-10-CM | POA: Insufficient documentation

## 2013-09-05 DIAGNOSIS — I440 Atrioventricular block, first degree: Secondary | ICD-10-CM | POA: Insufficient documentation

## 2013-09-05 LAB — PROTIME-INR: INR: 1.15 (ref 0.00–1.49)

## 2013-09-05 LAB — CBC WITH DIFFERENTIAL/PLATELET
Basophils Absolute: 0 10*3/uL (ref 0.0–0.1)
Basophils Relative: 1 % (ref 0–1)
Eosinophils Absolute: 0.1 10*3/uL (ref 0.0–0.7)
Hemoglobin: 12.2 g/dL — ABNORMAL LOW (ref 13.0–17.0)
MCH: 29 pg (ref 26.0–34.0)
MCHC: 34.1 g/dL (ref 30.0–36.0)
MCV: 85.2 fL (ref 78.0–100.0)
Monocytes Relative: 9 % (ref 3–12)
Neutrophils Relative %: 58 % (ref 43–77)
RDW: 13.1 % (ref 11.5–15.5)

## 2013-09-05 LAB — SURGICAL PCR SCREEN
MRSA, PCR: NEGATIVE
Staphylococcus aureus: NEGATIVE

## 2013-09-05 LAB — URINALYSIS, ROUTINE W REFLEX MICROSCOPIC
Bilirubin Urine: NEGATIVE
Glucose, UA: NEGATIVE mg/dL
Hgb urine dipstick: NEGATIVE
Ketones, ur: NEGATIVE mg/dL
Leukocytes, UA: NEGATIVE
Protein, ur: NEGATIVE mg/dL
pH: 5.5 (ref 5.0–8.0)

## 2013-09-05 LAB — BASIC METABOLIC PANEL
BUN: 16 mg/dL (ref 6–23)
CO2: 26 mEq/L (ref 19–32)
Calcium: 9 mg/dL (ref 8.4–10.5)
Creatinine, Ser: 0.78 mg/dL (ref 0.50–1.35)
GFR calc Af Amer: >90 mL/min (ref >90)
GFR calc non Af Amer: >90 mL/min (ref >90)
Glucose, Bld: 58 mg/dL — ABNORMAL LOW (ref 70–99)

## 2013-09-05 LAB — ABO/RH: ABO/RH(D): A POS

## 2013-09-05 NOTE — Pre-Procedure Instructions (Addendum)
[name]  09/05/2013   Your procedure is scheduled on:  09/12/13  Report to [facility] cone short stay admitting at 1015 AM.  Call this number if you have problems the morning of surgery: [phone]   Remember:   Do not eat food or drink liquids after midnight.   Take these medicines the morning of surgery with A SIP OF WATER: none        STOP all herbel meds, nsaids (aleve,naproxen,advil,ibuprofen) 5 days prior to surgery including vitamins   Do not wear jewelry, make-up or nail polish.  Do not wear lotions, powders, or perfumes. You may wear deodorant.  Do not shave 48 hours prior to surgery. Men may shave face and neck.  Do not bring valuables to the hospital.  [facility] is not responsible                  for any belongings or valuables.               Contacts, dentures or bridgework may not be worn into surgery.  Leave suitcase in the car. After surgery it may be brought to your room.  For patients admitted to the hospital, discharge time is determined by your                treatment team.               Patients discharged the day of surgery will not be allowed to drive  home.  Name and phone number of your driver:   Special Instructions: Incentive Spirometry - Practice and bring it with you on the day of surgery.shower per inst sheet given    Please read over the following fact sheets that you were given: Pain Booklet, Coughing and Deep Breathing, Blood Transfusion Information, MRSA Information and Surgical Site Infection Prevention

## 2013-09-11 MED ORDER — CEFAZOLIN SODIUM-DEXTROSE 2-3 GM-% IV SOLR
2.0000 g | INTRAVENOUS | Status: AC
  Administered 2013-09-12: 2 g via INTRAVENOUS
  Filled 2013-09-11: qty 50

## 2013-09-11 NOTE — Progress Notes (Signed)
Was informed by OR desk, that Mr. [name]'s surgery time has been changed to 10:30am.  I tried both fone numbers on contact page and left 2 messages instructing Mr. [name] to be here @ 8:30 AM

## 2013-09-11 NOTE — H&P (Signed)
TOTAL KNEE ADMISSION H&P  Patient is being admitted for left total knee arthroplasty.  Subjective:  Chief Complaint:left knee pain.  HPI: [name], 47 y.o. male, has a history of pain and functional disability in the left knee due to arthritis and has failed non-surgical conservative treatments for greater than 12 weeks to includeNSAID's and/or analgesics, corticosteriod injections, flexibility and strengthening excercises, weight reduction as appropriate and activity modification.  Onset of symptoms was gradual, starting many years ago with gradually worsening course since that time. The patient noted no past surgery on the left knee(s).  Patient currently rates pain in the left knee(s) at 10 out of 10 with activity. Patient has night pain, worsening of pain with activity and weight bearing, pain that interferes with activities of daily living, pain with passive range of motion and joint swelling.  Patient has evidence of joint subluxation and joint space narrowing by imaging studies. There is no active infection.  Patient Active Problem List   Diagnosis Date Noted  . 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 07/03/2012  . DJD (degenerative joint disease) of knee 03/22/2012  . Morbid obesity, Weight - 349, BMI - 52.9 01/31/2012   Past Medical History  Diagnosis Date  . 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 Osteoarthritis     L knee  . History of chicken pox   . History of colonic polyps     Past Surgical History  Procedure Laterality Date  . Breath tek h pylori  02/14/2012    Procedure: BREATH TEK H PYLORI;  Surgeon: Kandis Cocking, MD;  Location: [facility];  Service: General;  Laterality: N/A;  . Knee surgery  87    Left  . Wrist surgery      LEFT child  . Tonsillectomy    . Gastric roux-en-y  06/17/2012    Procedure: LAPAROSCOPIC ROUX-EN-Y GASTRIC;  Surgeon: Kandis Cocking, MD;  Location: [facility];  Service: General;  Laterality: N/A;    No  prescriptions prior to admission   No Known Allergies  History  Substance Use Topics  . Smoking status: Never Smoker   . Smokeless tobacco: Never Used     Comment: married, lives with spouse. works with [employer], Clinical biochemist  . Alcohol Use: No    Family History  Problem Relation Age of Onset  . Diabetes Mother 23  . Lung cancer Father 61  . Hypertension Father   . Alcohol abuse Father   . Stomach cancer Mother 35  . Hypertension Mother      Review of Systems  Constitutional: Negative.   HENT: Negative.   Eyes: Negative.   Respiratory: Negative.   Cardiovascular: Negative.   Gastrointestinal: Negative.   Genitourinary: Negative.   Musculoskeletal: Positive for joint pain.  Skin: Negative.   Neurological: Negative.   Endo/Heme/Allergies: Negative.   Psychiatric/Behavioral: Negative.     Objective:  Physical Exam  Constitutional: He is oriented to person, place, and time. He appears well-developed and well-nourished.  HENT:  Head: Normocephalic and atraumatic.  Eyes: Pupils are equal, round, and reactive to light.  Neck: Normal range of motion. Neck supple.  Cardiovascular: Intact distal pulses.   Respiratory: Effort normal.  Musculoskeletal: He exhibits tenderness.  The patient has impressive left greater than right knee, valgus deformities.  The approach 15 bilaterally.  The left knee does come to full extension, the medial collateral ligament is stable but mildly tender to palpation.  He  flexes to about 120 with discomfort one plus effusion.  Very tender along the medial joint line.  Neurovascular intact to both lower extremities.  Regarding his right hip exam he has an internal rotation limited at -5 external rotation is 20, although he does not have much discomfort when you move his hip.  Neurological: He is alert and oriented to person, place, and time.  Skin: Skin is warm and dry.  Psychiatric: He has a normal mood and affect. His behavior is  normal. Judgment and thought content normal.    Vital signs in last 24 hours:    Labs:   Estimated body mass index is 29.20 kg/(m^2) as calculated from the following:   Height as of 07/23/13: 5\' 8"  (1.727 m).   Weight as of 08/18/13: 87.091 kg (192 lb).   Imaging Review Radiographs:  X-rays were ordered, performed, and interpreted by me today included; impressive bone-on-bone arthritic changes to both knees with 15 valgus deformities and lateral subluxation of tibia beneath the femur.  Large peripheral osteophytes and large patellofemoral osteophytes as well.  Overall, he has severe end-stage arthritis.  I am not sure how he is been walking these years.  I did review an AP pelvis that was done by Dr. August Saucer a few years ago on the PACS system it also shows bone-on-bone arthritic changes..  Assessment/Plan:  End stage arthritis, left knee   The patient history, physical examination, clinical judgment of the provider and imaging studies are consistent with end stage degenerative joint disease of the left knee(s) and total knee arthroplasty is deemed medically necessary. The treatment options including medical management, injection therapy arthroscopy and arthroplasty were discussed at length. The risks and benefits of total knee arthroplasty were presented and reviewed. The risks due to aseptic loosening, infection, stiffness, patella tracking problems, thromboembolic complications and other imponderables were discussed. The patient acknowledged the explanation, agreed to proceed with the plan and consent was signed. Patient is being admitted for inpatient treatment for surgery, pain control, PT, OT, prophylactic antibiotics, VTE prophylaxis, progressive ambulation and ADL's and discharge planning. The patient is planning to be discharged home with home health services

## 2013-09-12 ENCOUNTER — Encounter (HOSPITAL_COMMUNITY): Payer: Self-pay | Admitting: Anesthesiology

## 2013-09-12 ENCOUNTER — Inpatient Hospital Stay (HOSPITAL_COMMUNITY)
Admission: RE | Admit: 2013-09-12 | Discharge: 2013-09-14 | DRG: 470 | Disposition: A | Discharge status: Home Health | Payer: BC Managed Care – PPO | Source: Ambulatory Visit | Attending: Orthopedic Surgery | Admitting: Orthopedic Surgery

## 2013-09-12 ENCOUNTER — Encounter (HOSPITAL_COMMUNITY)
Admission: RE | Disposition: A | Discharge status: Home Health | Payer: Self-pay | Source: Ambulatory Visit | Attending: Orthopedic Surgery

## 2013-09-12 ENCOUNTER — Inpatient Hospital Stay (HOSPITAL_COMMUNITY): Payer: BC Managed Care – PPO | Admitting: Anesthesiology

## 2013-09-12 ENCOUNTER — Encounter (HOSPITAL_COMMUNITY): Payer: BC Managed Care – PPO | Admitting: Anesthesiology

## 2013-09-12 DIAGNOSIS — M1710 Unilateral primary osteoarthritis, unspecified knee: Principal | ICD-10-CM | POA: Diagnosis present

## 2013-09-12 DIAGNOSIS — Z801 Family history of malignant neoplasm of trachea, bronchus and lung: Secondary | ICD-10-CM

## 2013-09-12 DIAGNOSIS — Z833 Family history of diabetes mellitus: Secondary | ICD-10-CM

## 2013-09-12 DIAGNOSIS — Z800 Family history of malignant neoplasm of digestive organs: Secondary | ICD-10-CM

## 2013-09-12 DIAGNOSIS — M1712 Unilateral primary osteoarthritis, left knee: Secondary | ICD-10-CM

## 2013-09-12 DIAGNOSIS — Z8249 Family history of ischemic heart disease and other diseases of the circulatory system: Secondary | ICD-10-CM

## 2013-09-12 DIAGNOSIS — Z9884 Bariatric surgery status: Secondary | ICD-10-CM

## 2013-09-12 HISTORY — PX: TOTAL KNEE ARTHROPLASTY: SHX125

## 2013-09-12 SURGERY — ARTHROPLASTY, KNEE, TOTAL
Anesthesia: General | Site: Knee | Laterality: Left | Wound class: Clean

## 2013-09-12 MED ORDER — ASPIRIN EC 325 MG PO TBEC: 325.0000 mg | DELAYED_RELEASE_TABLET | Freq: Two times a day (BID) | ORAL | Status: DC

## 2013-09-12 MED ORDER — ARTIFICIAL TEARS OP OINT
TOPICAL_OINTMENT | OPHTHALMIC | Status: DC | PRN
  Administered 2013-09-12: 1 via OPHTHALMIC

## 2013-09-12 MED ORDER — ACETAMINOPHEN 325 MG PO TABS: 650.0000 mg | ORAL_TABLET | Freq: Four times a day (QID) | ORAL | Status: DC | PRN

## 2013-09-12 MED ORDER — ESMOLOL HCL 10 MG/ML IV SOLN
INTRAVENOUS | Status: DC | PRN
  Administered 2013-09-12: 20 mg via INTRAVENOUS

## 2013-09-12 MED ORDER — SENNOSIDES-DOCUSATE SODIUM 8.6-50 MG PO TABS: 1.0000 | ORAL_TABLET | Freq: Every evening | ORAL | Status: DC | PRN

## 2013-09-12 MED ORDER — ONDANSETRON HCL 4 MG/2ML IJ SOLN: 4.0000 mg | Freq: Four times a day (QID) | INTRAMUSCULAR | Status: DC | PRN

## 2013-09-12 MED ORDER — ONDANSETRON HCL 4 MG/2ML IJ SOLN
INTRAMUSCULAR | Status: DC | PRN
  Administered 2013-09-12: 4 mg via INTRAVENOUS

## 2013-09-12 MED ORDER — DOCUSATE SODIUM 100 MG PO CAPS
100.0000 mg | ORAL_CAPSULE | Freq: Two times a day (BID) | ORAL | Status: DC
  Administered 2013-09-12 – 2013-09-14 (×4): 100 mg via ORAL
  Filled 2013-09-12 (×5): qty 1

## 2013-09-12 MED ORDER — BUPIVACAINE LIPOSOME 1.3 % IJ SUSP
20.0000 mL | INTRAMUSCULAR | Status: DC
  Filled 2013-09-12: qty 20

## 2013-09-12 MED ORDER — ALUM & MAG HYDROXIDE-SIMETH 200-200-20 MG/5ML PO SUSP: 30.0000 mL | ORAL | Status: DC | PRN

## 2013-09-12 MED ORDER — ASPIRIN EC 325 MG PO TBEC
325.0000 mg | DELAYED_RELEASE_TABLET | Freq: Every day | ORAL | Status: DC
  Administered 2013-09-13 – 2013-09-14 (×2): 325 mg via ORAL
  Filled 2013-09-12 (×3): qty 1

## 2013-09-12 MED ORDER — PROMETHAZINE HCL 25 MG/ML IJ SOLN: 6.2500 mg | INTRAMUSCULAR | Status: DC | PRN

## 2013-09-12 MED ORDER — FENTANYL CITRATE 0.05 MG/ML IJ SOLN: 50.0000 ug | Freq: Once | INTRAMUSCULAR | Status: DC

## 2013-09-12 MED ORDER — MENTHOL 3 MG MT LOZG: 1.0000 | LOZENGE | OROMUCOSAL | Status: DC | PRN

## 2013-09-12 MED ORDER — SODIUM CHLORIDE 0.9 % IR SOLN
Status: DC | PRN
  Administered 2013-09-12: 1000 mL

## 2013-09-12 MED ORDER — METHOCARBAMOL 500 MG PO TABS: 500.0000 mg | ORAL_TABLET | Freq: Two times a day (BID) | ORAL | Status: DC

## 2013-09-12 MED ORDER — SODIUM CHLORIDE 0.9 % IR SOLN
Status: DC | PRN
  Administered 2013-09-12 (×2): 1000 mL

## 2013-09-12 MED ORDER — ACETAMINOPHEN 650 MG RE SUPP: 650.0000 mg | Freq: Four times a day (QID) | RECTAL | Status: DC | PRN

## 2013-09-12 MED ORDER — DEXTROSE-NACL 5-0.45 % IV SOLN: INTRAVENOUS | Status: DC

## 2013-09-12 MED ORDER — LACTATED RINGERS IV SOLN
INTRAVENOUS | Status: DC
  Administered 2013-09-12: 10:00:00 via INTRAVENOUS

## 2013-09-12 MED ORDER — CEFAZOLIN SODIUM-DEXTROSE 2-3 GM-% IV SOLR
INTRAVENOUS | Status: AC
  Filled 2013-09-12: qty 50

## 2013-09-12 MED ORDER — OXYCODONE HCL 5 MG PO TABS: 5.0000 mg | ORAL_TABLET | Freq: Once | ORAL | Status: DC | PRN

## 2013-09-12 MED ORDER — METOCLOPRAMIDE HCL 10 MG PO TABS: 5.0000 mg | ORAL_TABLET | Freq: Three times a day (TID) | ORAL | Status: DC | PRN

## 2013-09-12 MED ORDER — HYDROMORPHONE HCL PF 1 MG/ML IJ SOLN
0.2500 mg | INTRAMUSCULAR | Status: DC | PRN
  Administered 2013-09-12 (×2): 0.5 mg via INTRAVENOUS

## 2013-09-12 MED ORDER — MAGNESIUM CITRATE PO SOLN: 1.0000 | Freq: Once | ORAL | Status: AC | PRN

## 2013-09-12 MED ORDER — ONDANSETRON HCL 4 MG PO TABS: 4.0000 mg | ORAL_TABLET | Freq: Four times a day (QID) | ORAL | Status: DC | PRN

## 2013-09-12 MED ORDER — LACTATED RINGERS IV SOLN
INTRAVENOUS | Status: DC | PRN
  Administered 2013-09-12 (×2): via INTRAVENOUS

## 2013-09-12 MED ORDER — CEFUROXIME SODIUM 750 MG IJ SOLR
INTRAMUSCULAR | Status: AC
  Filled 2013-09-12: qty 750

## 2013-09-12 MED ORDER — MIDAZOLAM HCL 2 MG/2ML IJ SOLN: 1.0000 mg | INTRAMUSCULAR | Status: DC | PRN

## 2013-09-12 MED ORDER — TRANEXAMIC ACID 100 MG/ML IV SOLN
1000.0000 mg | INTRAVENOUS | Status: AC
  Administered 2013-09-12: 1000 mg via INTRAVENOUS
  Filled 2013-09-12: qty 10

## 2013-09-12 MED ORDER — BUPIVACAINE-EPINEPHRINE PF 0.5-1:200000 % IJ SOLN
INTRAMUSCULAR | Status: DC | PRN
  Administered 2013-09-12: 25 mL

## 2013-09-12 MED ORDER — HYDROMORPHONE HCL PF 1 MG/ML IJ SOLN
0.5000 mg | INTRAMUSCULAR | Status: DC | PRN
  Administered 2013-09-13: 0.5 mg via INTRAVENOUS
  Filled 2013-09-12: qty 1

## 2013-09-12 MED ORDER — MIDAZOLAM HCL 2 MG/2ML IJ SOLN
INTRAMUSCULAR | Status: AC
  Filled 2013-09-12: qty 2

## 2013-09-12 MED ORDER — HYDROMORPHONE HCL PF 1 MG/ML IJ SOLN
INTRAMUSCULAR | Status: AC
  Filled 2013-09-12: qty 1

## 2013-09-12 MED ORDER — METOCLOPRAMIDE HCL 5 MG/ML IJ SOLN: 5.0000 mg | Freq: Three times a day (TID) | INTRAMUSCULAR | Status: DC | PRN

## 2013-09-12 MED ORDER — LIDOCAINE HCL (CARDIAC) 20 MG/ML IV SOLN
INTRAVENOUS | Status: DC | PRN
  Administered 2013-09-12: 100 mg via INTRAVENOUS

## 2013-09-12 MED ORDER — KCL IN DEXTROSE-NACL 20-5-0.45 MEQ/L-%-% IV SOLN
INTRAVENOUS | Status: DC
  Filled 2013-09-12 (×9): qty 1000

## 2013-09-12 MED ORDER — SODIUM CHLORIDE 0.9 % IJ SOLN
INTRAMUSCULAR | Status: DC | PRN
  Administered 2013-09-12: 12:00:00

## 2013-09-12 MED ORDER — MIDAZOLAM HCL 5 MG/5ML IJ SOLN
INTRAMUSCULAR | Status: DC | PRN
  Administered 2013-09-12: 2 mg via INTRAVENOUS

## 2013-09-12 MED ORDER — OXYCODONE HCL 5 MG/5ML PO SOLN: 5.0000 mg | Freq: Once | ORAL | Status: DC | PRN

## 2013-09-12 MED ORDER — OXYCODONE HCL 5 MG PO TABS
5.0000 mg | ORAL_TABLET | ORAL | Status: DC | PRN
  Administered 2013-09-13 – 2013-09-14 (×9): 10 mg via ORAL
  Filled 2013-09-12 (×10): qty 2

## 2013-09-12 MED ORDER — CHLORHEXIDINE GLUCONATE 4 % EX LIQD: 60.0000 mL | Freq: Once | CUTANEOUS | Status: DC

## 2013-09-12 MED ORDER — DIPHENHYDRAMINE HCL 12.5 MG/5ML PO ELIX: 12.5000 mg | ORAL_SOLUTION | ORAL | Status: DC | PRN

## 2013-09-12 MED ORDER — DEXAMETHASONE SODIUM PHOSPHATE 10 MG/ML IJ SOLN
INTRAMUSCULAR | Status: DC | PRN
  Administered 2013-09-12: 4 mg

## 2013-09-12 MED ORDER — PHENOL 1.4 % MT LIQD: 1.0000 | OROMUCOSAL | Status: DC | PRN

## 2013-09-12 MED ORDER — FENTANYL CITRATE 0.05 MG/ML IJ SOLN
INTRAMUSCULAR | Status: DC | PRN
  Administered 2013-09-12: 50 ug via INTRAVENOUS
  Administered 2013-09-12: 25 ug via INTRAVENOUS
  Administered 2013-09-12 (×3): 50 ug via INTRAVENOUS
  Administered 2013-09-12 (×3): 25 ug via INTRAVENOUS
  Administered 2013-09-12: 50 ug via INTRAVENOUS
  Administered 2013-09-12 (×2): 25 ug via INTRAVENOUS

## 2013-09-12 MED ORDER — OXYCODONE-ACETAMINOPHEN 5-325 MG PO TABS: 1.0000 | ORAL_TABLET | ORAL | Status: DC | PRN

## 2013-09-12 MED ORDER — METHOCARBAMOL 100 MG/ML IJ SOLN
500.0000 mg | Freq: Four times a day (QID) | INTRAVENOUS | Status: DC | PRN
  Filled 2013-09-12: qty 5

## 2013-09-12 MED ORDER — METHOCARBAMOL 500 MG PO TABS
500.0000 mg | ORAL_TABLET | Freq: Four times a day (QID) | ORAL | Status: DC | PRN
  Administered 2013-09-13 – 2013-09-14 (×6): 500 mg via ORAL
  Filled 2013-09-12 (×7): qty 1

## 2013-09-12 MED ORDER — PROPOFOL 10 MG/ML IV BOLUS
INTRAVENOUS | Status: DC | PRN
  Administered 2013-09-12: 200 mg via INTRAVENOUS

## 2013-09-12 MED ORDER — CEFUROXIME SODIUM 1.5 G IJ SOLR
INTRAMUSCULAR | Status: DC | PRN
  Administered 2013-09-12: 1.5 g

## 2013-09-12 MED ORDER — FENTANYL CITRATE 0.05 MG/ML IJ SOLN
INTRAMUSCULAR | Status: AC
  Filled 2013-09-12: qty 2

## 2013-09-12 MED ORDER — BISACODYL 5 MG PO TBEC: 5.0000 mg | DELAYED_RELEASE_TABLET | Freq: Every day | ORAL | Status: DC | PRN

## 2013-09-12 SURGICAL SUPPLY — 67 items
BANDAGE ESMARK 6X9 LF (GAUZE/BANDAGES/DRESSINGS) ×1 IMPLANT
BLADE SAG 18X100X1.27 (BLADE) ×3 IMPLANT
BLADE SAW SGTL 13X75X1.27 (BLADE) ×2 IMPLANT
BLADE SURG ROTATE 9660 (MISCELLANEOUS) IMPLANT
BNDG CMPR 9X6 STRL LF SNTH (GAUZE/BANDAGES/DRESSINGS) ×1
BNDG CMPR MED 10X6 ELC LF (GAUZE/BANDAGES/DRESSINGS) ×1
BNDG ELASTIC 6X10 VLCR STRL LF (GAUZE/BANDAGES/DRESSINGS) ×2 IMPLANT
BNDG ESMARK 6X9 LF (GAUZE/BANDAGES/DRESSINGS) ×2
BOWL SMART MIX CTS (DISPOSABLE) ×2 IMPLANT
CAPT RP KNEE ×1 IMPLANT
CEMENT HV SMART SET (Cement) ×4 IMPLANT
CLOTH BEACON ORANGE TIMEOUT ST (SAFETY) ×1 IMPLANT
COVER SURGICAL LIGHT HANDLE (MISCELLANEOUS) ×3 IMPLANT
CUFF TOURNIQUET SINGLE 34IN LL (TOURNIQUET CUFF) ×1 IMPLANT
CUFF TOURNIQUET SINGLE 44IN (TOURNIQUET CUFF) IMPLANT
DRAPE EXTREMITY T 121X128X90 (DRAPE) ×3 IMPLANT
DRAPE INCISE IOBAN 66X45 STRL (DRAPES) ×1 IMPLANT
DRAPE U-SHAPE 47X51 STRL (DRAPES) ×2 IMPLANT
DRSG PAD ABDOMINAL 8X10 ST (GAUZE/BANDAGES/DRESSINGS) ×1 IMPLANT
DURAPREP 26ML APPLICATOR (WOUND CARE) ×3 IMPLANT
ELECT REM PT RETURN 9FT ADLT (ELECTROSURGICAL) ×2
ELECTRODE REM PT RTRN 9FT ADLT (ELECTROSURGICAL) ×1 IMPLANT
EVACUATOR 1/8 PVC DRAIN (DRAIN) ×2 IMPLANT
GAUZE XEROFORM 1X8 LF (GAUZE/BANDAGES/DRESSINGS) ×2 IMPLANT
GLOVE BIO SURGEON STRL SZ7.5 (GLOVE) ×2 IMPLANT
GLOVE BIO SURGEON STRL SZ8.5 (GLOVE) ×3 IMPLANT
GLOVE BIOGEL PI IND STRL 7.0 (GLOVE) IMPLANT
GLOVE BIOGEL PI IND STRL 8 (GLOVE) ×2 IMPLANT
GLOVE BIOGEL PI IND STRL 9 (GLOVE) ×1 IMPLANT
GLOVE BIOGEL PI INDICATOR 7.0 (GLOVE) ×2
GLOVE BIOGEL PI INDICATOR 8 (GLOVE) ×4
GLOVE BIOGEL PI INDICATOR 9 (GLOVE) ×1
GLOVE SURG SS PI 6.5 STRL IVOR (GLOVE) ×1 IMPLANT
GLOVE SURG SS PI 7.0 STRL IVOR (GLOVE) ×5 IMPLANT
GOWN PREVENTION PLUS XLARGE (GOWN DISPOSABLE) ×1 IMPLANT
GOWN STRL NON-REIN LRG LVL3 (GOWN DISPOSABLE) ×3 IMPLANT
GOWN STRL REIN XL XLG (GOWN DISPOSABLE) ×4 IMPLANT
HANDPIECE INTERPULSE COAX TIP (DISPOSABLE) ×2
HOOD PEEL AWAY FACE SHEILD DIS (HOOD) ×5 IMPLANT
IMMOBILIZER KNEE 22 UNIV (SOFTGOODS) ×1 IMPLANT
KIT BASIN OR (CUSTOM PROCEDURE TRAY) ×2 IMPLANT
KIT ROOM TURNOVER OR (KITS) ×2 IMPLANT
MANIFOLD NEPTUNE II (INSTRUMENTS) ×2 IMPLANT
MARKER SKIN DUAL TIP RULER LAB (MISCELLANEOUS) ×1 IMPLANT
NDL SAFETY ECLIPSE 18X1.5 (NEEDLE) IMPLANT
NDL SPNL 18GX3.5 QUINCKE PK (NEEDLE) IMPLANT
NEEDLE HYPO 18GX1.5 SHARP (NEEDLE)
NEEDLE SPNL 18GX3.5 QUINCKE PK (NEEDLE) ×2 IMPLANT
NS IRRIG 1000ML POUR BTL (IV SOLUTION) ×2 IMPLANT
PACK TOTAL JOINT (CUSTOM PROCEDURE TRAY) ×2 IMPLANT
PAD ARMBOARD 7.5X6 YLW CONV (MISCELLANEOUS) ×3 IMPLANT
PADDING CAST COTTON 6X4 STRL (CAST SUPPLIES) ×2 IMPLANT
SET HNDPC FAN SPRY TIP SCT (DISPOSABLE) ×1 IMPLANT
SPONGE GAUZE 4X4 12PLY (GAUZE/BANDAGES/DRESSINGS) ×3 IMPLANT
STAPLER VISISTAT 35W (STAPLE) ×3 IMPLANT
SUCTION FRAZIER TIP 10 FR DISP (SUCTIONS) ×2 IMPLANT
SUT VIC AB 0 CTX 36 (SUTURE) ×2
SUT VIC AB 0 CTX36XBRD ANTBCTR (SUTURE) ×1 IMPLANT
SUT VIC AB 1 CTX 36 (SUTURE) ×2
SUT VIC AB 1 CTX36XBRD ANBCTR (SUTURE) ×1 IMPLANT
SUT VIC AB 2-0 CT1 27 (SUTURE) ×2
SUT VIC AB 2-0 CT1 TAPERPNT 27 (SUTURE) ×1 IMPLANT
SYR 50ML LL SCALE MARK (SYRINGE) ×2 IMPLANT
TOWEL OR 17X24 6PK STRL BLUE (TOWEL DISPOSABLE) ×2 IMPLANT
TOWEL OR 17X26 10 PK STRL BLUE (TOWEL DISPOSABLE) ×2 IMPLANT
TRAY FOLEY CATH 14FR (SET/KITS/TRAYS/PACK) ×1 IMPLANT
WATER STERILE IRR 1000ML POUR (IV SOLUTION) ×2 IMPLANT

## 2013-09-12 NOTE — Interval H&P Note (Signed)
History and Physical Interval Note:  09/12/2013 11:10 AM  [name]  has presented today for surgery, with the diagnosis of LEFT KNEE OSTEOARTHRITIS  The various methods of treatment have been discussed with the patient and family. After consideration of risks, benefits and other options for treatment, the patient has consented to  Procedure(s): TOTAL KNEE ARTHROPLASTY (Left) as a surgical intervention .  The patient's history has been reviewed, patient examined, no change in status, stable for surgery.  I have reviewed the patient's chart and labs.  Questions were answered to the patient's satisfaction.     Nestor Lewandowsky

## 2013-09-12 NOTE — Anesthesia Postprocedure Evaluation (Signed)
  Anesthesia Post-op Note  Patient: [name]  Procedure(s) Performed: Procedure(s): TOTAL KNEE ARTHROPLASTY (Left)  Patient Location: PACU  Anesthesia Type:GA combined with regional for post-op pain  Level of Consciousness: awake and alert   Airway and Oxygen Therapy: Patient Spontanous Breathing  Post-op Pain: mild  Post-op Assessment: Post-op Vital signs reviewed, Patient's Cardiovascular Status Stable, Respiratory Function Stable, Patent Airway, No signs of Nausea or vomiting and Pain level controlled  Post-op Vital Signs: Reviewed and stable  Complications: No apparent anesthesia complications

## 2013-09-12 NOTE — Transfer of Care (Signed)
Immediate Anesthesia Transfer of Care Note  Patient: [name]  Procedure(s) Performed: Procedure(s): TOTAL KNEE ARTHROPLASTY (Left)  Patient Location: PACU  Anesthesia Type:General  Level of Consciousness: awake, alert  and oriented  Airway & Oxygen Therapy: Patient Spontanous Breathing and Patient connected to nasal cannula oxygen  Post-op Assessment: Report given to PACU RN and Post -op Vital signs reviewed and stable  Post vital signs: Reviewed and stable  Complications: No apparent anesthesia complications

## 2013-09-12 NOTE — Preoperative (Signed)
Beta Blockers   Reason not to administer Beta Blockers:Not Applicable 

## 2013-09-12 NOTE — Anesthesia Preprocedure Evaluation (Signed)
Anesthesia Evaluation  Patient identified by MRN, date of birth, ID band Patient awake    Reviewed: Allergy & Precautions, H&P , NPO status , Patient's Chart, lab work & pertinent test results  Airway Mallampati: II TM Distance: >3 FB Neck ROM: Full    Dental   Pulmonary  breath sounds clear to auscultation        Cardiovascular Rhythm:Regular Rate:Normal     Neuro/Psych    GI/Hepatic   Endo/Other    Renal/GU      Musculoskeletal   Abdominal   Peds  Hematology   Anesthesia Other Findings H/o gastric bypass  Reproductive/Obstetrics                           Anesthesia Physical Anesthesia Plan  ASA: II  Anesthesia Plan: General   Post-op Pain Management:    Induction: Intravenous  Airway Management Planned: LMA  Additional Equipment:   Intra-op Plan:   Post-operative Plan: Extubation in OR  Informed Consent: I have reviewed the patients History and Physical, chart, labs and discussed the procedure including the risks, benefits and alternatives for the proposed anesthesia with the patient or authorized representative who has indicated his/her understanding and acceptance.     Plan Discussed with: CRNA and Surgeon  Anesthesia Plan Comments:         Anesthesia Quick Evaluation

## 2013-09-12 NOTE — Op Note (Signed)
PATIENT ID:      [name]  MRN:     [id number] DOB/AGE:    [birth year] / 47 y.o.       OPERATIVE REPORT    DATE OF PROCEDURE:  09/12/2013       PREOPERATIVE DIAGNOSIS:   LEFT KNEE OSTEOARTHRITIS      Estimated body mass index is 28.77 kg/(m^2) as calculated from the following:   Height as of 09/05/13: 5' 8.5" (1.74 m).   Weight as of 08/18/13: 87.091 kg (192 lb).                                                        POSTOPERATIVE DIAGNOSIS:   LEFT KNEE OSTEOARTHRITIS                                                                      PROCEDURE:  Procedure(s): TOTAL KNEE ARTHROPLASTY Using Depuy Sigma RP implants #5L Femur, #6Tibia, 10mm sigma RP bearing, 41 Patella     SURGEON: Sally-Anne Wamble J    ASSISTANT:   Eric K. [employer]   (Present and scrubbed throughout the case, critical for assistance with exposure, retraction, instrumentation, and closure.)         ANESTHESIA: GET Exparel, adductor canal block  DRAINS: foley, 2 medium hemovac in knee   TOURNIQUET TIME:   COMPLICATIONS:  None     SPECIMENS: None   INDICATIONS FOR PROCEDURE: The patient has  LEFT KNEE OSTEOARTHRITIS, far valgus deformities, 15 flexion contracture ,XR shows bone on bone arthritis. Patient has failed all conservative measures including anti-inflammatory medicines, narcotics, attempts at  exercise and weight loss, cortisone injections.  Risks and benefits of surgery have been discussed, questions answered. We are planning on a valgus approach to the knee because of the far valgus deformity of 20 with a flexion contracture patient is aware that the peroneal nerve has an increased risk because of the deformity.  DESCRIPTION OF PROCEDURE: The patient identified by armband, received  IV antibiotics, in the holding area at [facility]. Patient taken to the operating room, appropriate anesthetic  monitors were attached, and general endotracheal anesthesia induced with  the patient in supine  position, Foley catheter was inserted. Tourniquet  applied high to the operative thigh. Lateral post and foot positioner  applied to the table, the lower extremity was then prepped and draped  in usual sterile fashion from the ankle to the tourniquet. Time-out procedure was performed. The limb was wrapped with an Esmarch bandage and the tourniquet inflated to 350 mmHg. We began the operation by making the anterior midline incision starting at handbreadth above the patella going over the patella 1 cm medial to and  4 cm distal to the tibial tubercle. Small bleeders in the skin and the  subcutaneous tissue identified and cauterized. Transverse retinaculum was incised and reflected laterally and a lateral parapatellar arthrotomy was accomplished. the patella was everted and theprepatellar fat pad resected. The iliotibial band was elevated off and courteous tubercle and released going down the anterolateral flare of the tibia. The  knee was hyperflexed exposing bone on bone arthritis. Large peripheral osteophytes laterally anteriorly and medially removed with Aundria Rud and a small oscillating saw. Peripheral and notch osteophytes as well as the cruciate ligaments were then resected. We continued to  work our way around posteriorly along the proximal tibia, and  internally rotated the tibia subluxing it out from underneath the femur. A McHale  retractor was placed through the notch and a medial Hohmann retractor  placed, and we then drilled through the proximal tibia in line with the  axis of the tibia followed by an intramedullary guide rod and 2-degree  posterior slope cutting guide. The tibial cutting guide was pinned into place  allowing resection of 10 mm of bone medially and about 0 mm of bone  laterally because of her valgus deformity. Satisfied with the tibial resection, we then  entered the distal femur 2 mm anterior to the PCL origin with the  intramedullary guide rod and applied the distal femoral  cutting guide  set at 11mm, with 6 degrees of valgus. This was pinned along the  epicondylar axis. At this point, the distal femoral cut 11mm was accomplished without difficulty. We then sized for a #5L femoral component and pinned the guide in 0 degrees of external rotation.The chamfer cutting guide was pinned into place. The anterior, posterior, and chamfer cuts were accomplished without difficulty followed by  the Sigma RP box cutting guide and the box cut. We also removed posterior large osteophytes from the posterior femoral condyles. At this  time, the knee was brought into full extension. We checked our  extension and flexion gaps, and because it was tight in extension removed 4 more millimeters from the proximal tibia. This gave [place] symmetric gaps at 10mm.  The patella thickness measured at 27 mm. We set the cutting guide at 17 and removed the posterior 10 mm  of the patella, sized for a 41 button and drilled the lollipop. The knee  was then once again hyperflexed exposing the proximal tibia. We sized for a #6 tibial base plate, applied the smokestack and the conical reamer followed by the the Delta fin keel punch. We then hammered into place the Sigma RP trial femoral component, inserted a 10-mm trial bearing, trial patellar button, and took the knee through range of motion from 0-130 degrees. No thumb pressure was required for patellar  tracking. At this point, all trial components were removed, a double batch of DePuy HV cement with 1500 mg of Zinacef was mixed and applied to all bony metallic mating surfaces except for the posterior condyles of the femur itself. In order, we  hammered into place the tibial tray and removed excess cement, the femoral component and removed excess cement, a 10-mm Sigma RP bearing  was inserted, and the knee brought to full extension with compression.  The patellar button was clamped into place, and excess cement  removed. While the cement cured the wound was  irrigated out with normal saline solution pulse lavage, and medium Hemovac drains were placed from an anterolateral  approach. Ligament stability and patellar tracking were checked and found to be excellent. The parapatellar arthrotomy was closed with  running #1 Vicryl suture. The subcutaneous tissue with 0 and 2-0 undyed  Vicryl suture, and the skin with skin staples. A dressing of Xeroform,  4 x 4, dressing sponges, Webril, and Ace wrap applied. The patient  awakened, extubated, and taken to recovery room without difficulty.   Gean Birchwood J 09/12/2013, 1:26 PM

## 2013-09-12 NOTE — Anesthesia Procedure Notes (Addendum)
Anesthesia Regional Block:  Adductor canal block  Pre-Anesthetic Checklist: ,, timeout performed, Correct Patient, Correct Site, Correct Laterality, Correct Procedure, Correct Position, site marked, Risks and benefits discussed,  Surgical consent,  Pre-op evaluation,  At surgeon's request and post-op pain management  Laterality: Left  Prep: chloraprep       Needles:   Needle Type: Echogenic Stimulator Needle      Needle Gauge: 22 and 22 G    Additional Needles:  Procedures: ultrasound guided (picture in chart) Adductor canal block Narrative:  Start time: 09/12/2013 10:48 AM End time: 09/12/2013 11:04 AM Injection made incrementally with aspirations every 5 mL. Anesthesiologist: Dr Gypsy Balsam  Additional Notes: 9147-8295 L Adductor Canal Block POP CHG prep, sterile tech #22 echo needle with good [place] visualization- PIX in chart One hand breath above patella Vernia Buff .5% w/epi 1:200000 total 25cc+decadron 4mg  infil Multiple neg asp No compl Dr Gypsy Balsam   Procedure Name: LMA Insertion Date/Time: 09/12/2013 11:22 AM Performed by: Gayla Medicus Pre-anesthesia Checklist: Patient identified, Timeout performed, Emergency Drugs available, Suction available and Patient being monitored Patient Re-evaluated:Patient Re-evaluated prior to inductionOxygen Delivery Method: Circle system utilized Preoxygenation: Pre-oxygenation with 100% oxygen Intubation Type: IV induction LMA: LMA inserted LMA Size: 5.0 Number of attempts: 1 Placement Confirmation: positive ETCO2 and breath sounds checked- equal and bilateral Tube secured with: Tape Dental Injury: Teeth and Oropharynx as per pre-operative assessment

## 2013-09-12 NOTE — Progress Notes (Signed)
Orthopedic Tech Progress Note Patient Details:  [name] 10/10/1966 [id number]  CPM Left Knee CPM Left Knee: On Left Knee Flexion (Degrees): 60 Left Knee Extension (Degrees): 0 Additional Comments: Trapeze bar   Shawnie Pons 09/12/2013, 2:48 PM

## 2013-09-13 LAB — CBC
Hemoglobin: 9.8 g/dL — ABNORMAL LOW (ref 13.0–17.0)
MCH: 28.9 pg (ref 26.0–34.0)
Platelets: 152 10*3/uL (ref 150–400)
RBC: 3.39 MIL/uL — ABNORMAL LOW (ref 4.22–5.81)
WBC: 9.7 10*3/uL (ref 4.0–10.5)

## 2013-09-13 NOTE — Progress Notes (Signed)
OT Cancellation Note  Patient Details Name: [name] MRN: [id number] DOB: Apr 13, 1966   Cancelled Treatment:    Reason Eval/Treat Not Completed: Other (comment). PT in with pt for second session and had just gotten pt into CPM.  Evette Georges [phone] 09/13/2013, 3:42 PM

## 2013-09-13 NOTE — Progress Notes (Signed)
Physical Therapy Treatment Patient Details Name: [name] MRN: [id number] DOB: 1966-03-07 Today's Date: 09/13/2013 Time: 4098-1191 PT Time Calculation (min): 29 min  PT Assessment / Plan / Recommendation  History of Present Illness admitted for elective TKR left; patient with history of passing out easily (per patient)   PT Comments   Patient did much better this afternoon.  Required less assistance, able to ambulate further and no signs of passing out.  Patient with extremely weak quad on left.  Will continue PT for progressive ambulation and  strengthening and ROM exercises  Follow Up Recommendations  Home health PT;Supervision - Intermittent     Does the patient have the potential to tolerate intense rehabilitation     Barriers to Discharge        Equipment Recommendations  Rolling walker with 5" wheels;3in1 (PT)    Recommendations for Other Services    Frequency 7X/week   Progress towards PT Goals Progress towards PT goals: Progressing toward goals  Plan Current plan remains appropriate    Precautions / Restrictions     Pertinent Vitals/Pain Less pain this session; 5/10 during mobility    Mobility  Bed Mobility Bed Mobility: Sit to Supine Sit to Supine: 4: Min assist Details for Bed Mobility Assistance: for LLE assistance Transfers Transfers: Sit to Stand;Stand to Sit Sit to Stand: 5: Supervision;With upper extremity assist;From chair/3-in-1 Stand to Sit: 5: Supervision;With upper extremity assist;To bed Ambulation/Gait Ambulation/Gait Assistance: 4: Min guard Ambulation Distance (Feet): 30 Feet Assistive device: Rolling walker Ambulation/Gait Assistance Details: ambulated with left foot externally rotated; able to correct slightly with cueing Gait Pattern: Step-to pattern    Exercises Total Joint Exercises Ankle Circles/Pumps: AROM;Both;10 reps;Supine Quad Sets: AROM;Strengthening;Both;10 reps;Supine Gluteal Sets: AROM;Strengthening;Both;10  reps;Supine Towel Squeeze: AROM;Strengthening;Both;10 reps;Supine Short Arc Quad: AAROM;Left;10 reps;Supine Heel Slides: AAROM;Left;10 reps;Supine Hip ABduction/ADduction: AAROM;Left;10 reps;Supine Straight Leg Raises: AAROM;Left;10 reps;Supine   PT Diagnosis:    PT Problem List:   PT Treatment Interventions:     PT Goals (current goals can now be found in the care plan section)    Visit Information  Last PT Received On: 09/13/13 Assistance Needed: +2 (for safety) History of Present Illness: admitted for elective TKR left; patient with history of passing out easily (per patient)    Subjective Data      Cognition  Cognition Arousal/Alertness: Awake/alert Behavior During Therapy: WFL for tasks assessed/performed Overall Cognitive Status: Within Functional Limits for tasks assessed    Balance  Balance Balance Assessed:  (no apparent balance deficits noted)  End of Session PT - End of Session Equipment Utilized During Treatment: Gait belt Activity Tolerance: Patient tolerated treatment well Patient left: in bed;with family/visitor present;with call bell/phone within reach CPM Left Knee CPM Left Knee: On Left Knee Flexion (Degrees): 60 Left Knee Extension (Degrees): 0   GP     Olivia Canter, [place] [phone] 09/13/2013, 4:04 PM

## 2013-09-13 NOTE — Evaluation (Signed)
Physical Therapy Evaluation Patient Details Name: [name] MRN: [id number] DOB: 06/15/1966 Today's Date: 09/13/2013 Time: 4098-1191 PT Time Calculation (min): 40 min  PT Assessment / Plan / Recommendation History of Present Illness  admitted for elective TKR left; patient with history of passing out easily (per patient)  Clinical Impression  Patient s/p TKR.  Patient did well with mobility, however, limited by passing out during gait.  Patient reports history of passing when he gets overheated.  Patient also with several valgus prior to surgery (per chart).  Anticipate patient will need extensive HH/OP PT to address gait pattern for return to normal gait pattern.  Patient with limited support at home as wife just had baby.    PT Assessment  Patient needs continued PT services    Follow Up Recommendations  Home health PT;Supervision - Intermittent    Does the patient have the potential to tolerate intense rehabilitation      Barriers to Discharge Decreased caregiver support      Equipment Recommendations  Rolling walker with 5" wheels;3in1 (PT)    Recommendations for Other Services     Frequency 7X/week    Precautions / Restrictions Precautions Precautions: Knee Required Braces or Orthoses: Knee Immobilizer - Left Knee Immobilizer - Left: On except when in CPM   Pertinent Vitals/Pain Pain 7/10 in left shin; patient pre-medicated prior to PT      Mobility  Bed Mobility Bed Mobility: Supine to Sit Supine to Sit: 4: Min assist Details for Bed Mobility Assistance: for LLE assistance Transfers Transfers: Sit to Stand;Stand to Sit Sit to Stand: 4: Min assist;From elevated surface;From bed;With upper extremity assist Stand to Sit: 1: +2 Total assist;To chair/3-in-1 Details for Transfer Assistance: patient passed out during gait, required +2 total assist to get to chair. Ambulation/Gait Ambulation/Gait Assistance: 4: Min assist Ambulation Distance (Feet): 10  Feet Assistive device: Rolling walker Ambulation/Gait Assistance Details: ambulates with left foot externally rotated Gait Pattern: Step-to pattern    Exercises Total Joint Exercises Ankle Circles/Pumps: AROM;Both;10 reps;Seated   PT Diagnosis: Difficulty walking;Acute pain  PT Problem List: Decreased range of motion;Decreased activity tolerance;Decreased balance;Decreased mobility;Decreased knowledge of use of DME;Cardiopulmonary status limiting activity;Pain PT Treatment Interventions: DME instruction;Gait training;Stair training;Functional mobility training;Therapeutic activities;Therapeutic exercise     PT Goals(Current goals can be found in the care plan section) Acute Rehab PT Goals Patient Stated Goal: make sure I can get around normally PT Goal Formulation: With patient Time For Goal Achievement: 09/20/13 Potential to Achieve Goals: Good  Visit Information  Last PT Received On: 09/13/13 Assistance Needed: +2 History of Present Illness: admitted for elective TKR left; patient with history of passing out easily (per patient)       Prior Functioning  Home Living Family/patient expects to be discharged to:: Private residence Living Arrangements: Alone Available Help at Discharge: Family Type of Home: House Home Access: Level entry Home Layout: One level Home Equipment: None Additional Comments: wife just had baby (1 month ago) Prior Function Level of Independence: Independent Communication Communication: No difficulties    Cognition  Cognition Arousal/Alertness: Awake/alert Behavior During Therapy: WFL for tasks assessed/performed Overall Cognitive Status: Within Functional Limits for tasks assessed    Extremity/Trunk Assessment Upper Extremity Assessment Upper Extremity Assessment: Defer to OT evaluation Lower Extremity Assessment Lower Extremity Assessment: Overall WFL for tasks assessed;LLE deficits/detail LLE: Unable to fully assess due to pain   Balance  Balance Balance Assessed: No  End of Session PT - End of Session Equipment Utilized During Treatment:  Gait belt Activity Tolerance: Treatment limited secondary to medical complications (Comment) Patient left: in chair;with call bell/phone within reach Nurse Communication: Mobility status CPM Left Knee CPM Left Knee: Off  GP     Olivia Canter, [place] [phone] 09/13/2013, 11:27 AM

## 2013-09-13 NOTE — Progress Notes (Signed)
PATIENT ID: Laurena Spies   1 Day Post-Op Procedure(s) (LRB): TOTAL KNEE ARTHROPLASTY (Left)  Subjective: Reports feeling well 1 day after surgery. Knee in CPM this am and tolerating it well. Pain controlled.   Objective:  Filed Vitals:   09/13/13 0533  BP: 131/73  Pulse: 93  Temp: 98.2 F (36.8 C)  Resp: 18     Awake, alert, orientated L LE dressing c/d/i Wiggles toes, distally NVI Calf/compartments soft/nontender Drain removed  Labs:   Recent Labs  09/13/13 0451  HGB 9.8*   Recent Labs  09/13/13 0451  WBC 9.7  RBC 3.39*  HCT 29.0*  PLT 152  No results found for this basename: NA, K, CL, CO2, BUN, CREATININE, GLUCOSE, CALCIUM,  in the last 72 hours  Assessment and Plan: Doing well 1 day s/p left TKA Continue knee immobilizer except with PT prn and CPM Drain removed today without problems Will work with PT today and plan for d/c home tomorrow afternoon Scripts in chart for d/c  VTE proph: ASA daily, SCDs

## 2013-09-14 LAB — CBC
HCT: 26.7 % — ABNORMAL LOW (ref 39.0–52.0)
Hemoglobin: 9.1 g/dL — ABNORMAL LOW (ref 13.0–17.0)
MCH: 29.3 pg (ref 26.0–34.0)
MCHC: 34.1 g/dL (ref 30.0–36.0)
RDW: 13.1 % (ref 11.5–15.5)

## 2013-09-14 NOTE — Progress Notes (Signed)
Physical Therapy Treatment Patient Details Name: [name] MRN: [id number] DOB: 1966-04-22 Today's Date: 09/14/2013 Time: 1010-1038 PT Time Calculation (min): 28 min  PT Assessment / Plan / Recommendation  History of Present Illness admitted for elective TKR left; patient with history of passing out easily (per patient)   PT Comments   Pt making great progress with mobility today with increased activity and less assistance needed. May benefit from 24 hour supervision initially progressing to intermittent supervision due to his history of passing out yesterday (reports his wife is home at this time on maternity leave).   Follow Up Recommendations  Home health PT;Supervision - Intermittent     Equipment Recommendations  Rolling walker with 5" wheels;3in1 (PT)    Frequency 7X/week   Progress towards PT Goals Progress towards PT goals: Progressing toward goals  Plan Current plan remains appropriate    Precautions / Restrictions Precautions Precautions: Knee Required Braces or Orthoses: Knee Immobilizer - Left Knee Immobilizer - Left: On except when in CPM Restrictions LLE Weight Bearing: Weight bearing as tolerated    Mobility  Bed Mobility Supine to Sit: 6: Modified independent (Device/Increase time);HOB flat Sit to Supine: 5: Supervision Details for Bed Mobility Assistance: cues to use hands to assist with moving left LE to edge of bed and off edge of bed due to LE weakness. otherwise no cues or physical assistance needed. Transfers Sit to Stand: 5: Supervision;From bed;With upper extremity assist Stand to Sit: 5: Supervision;To chair/3-in-1;With upper extremity assist;With armrests Details for Transfer Assistance: sit<>stand X2 for repetition, strengthening and RW adjustment. no reports of dizziness or presycopal feelings. supervision for safety only. Ambulation/Gait Ambulation/Gait Assistance: 4: Min guard;5: Supervision Ambulation Distance (Feet): 200 Feet Assistive  device: Rolling walker Gait Pattern: Step-through pattern;Antalgic    Exercises Total Joint Exercises Ankle Circles/Pumps: AROM;Both;10 reps;Supine Quad Sets: AROM;Strengthening;Left;10 reps;Supine Short Arc Quad: AAROM;Strengthening;Left;10 reps;Supine Heel Slides: AAROM;Strengthening;Left;10 reps;Supine Hip ABduction/ADduction: AAROM;Strengthening;Left;10 reps;Supine Straight Leg Raises: AAROM;Strengthening;Left;10 reps;Supine    PT Goals (current goals can now be found in the care plan section) Acute Rehab PT Goals Patient Stated Goal: make sure I can get around normally PT Goal Formulation: With patient Time For Goal Achievement: 09/20/13 Potential to Achieve Goals: Good  Visit Information  Last PT Received On: 09/14/13 Assistance Needed: +2 (for safety due to Passes out easily) History of Present Illness: admitted for elective TKR left; patient with history of passing out easily (per patient)    Subjective Data  Patient Stated Goal: make sure I can get around normally   Cognition  Cognition Arousal/Alertness: Awake/alert Behavior During Therapy: WFL for tasks assessed/performed Overall Cognitive Status: Within Functional Limits for tasks assessed    End of Session PT - End of Session Equipment Utilized During Treatment: Gait belt Activity Tolerance: Patient tolerated treatment well Patient left: in chair;with call bell/phone within reach Nurse Communication: Mobility status   GP     Sallyanne Kuster 09/14/2013, 2:13 PM  Sallyanne Kuster, PTA Office- [phone]

## 2013-09-14 NOTE — Progress Notes (Signed)
PATIENT ID: [name]   2 Days Post-Op Procedure(s) (LRB): TOTAL KNEE ARTHROPLASTY (Left)  Subjective: Reports feeling well this am, pain is well controlled and slept well overnight. Had a syncopal episode yesterday morning with PT but did better in the afternoon. Reports history of syncope with pain/exertion. Feels ready to work with PT today. Ready to go home ifcleared by PT.  Objective:  Filed Vitals:   09/14/13 0552  BP: 121/70  Pulse: 92  Temp: 99 F (37.2 C)  Resp: 18     Awake, alert, orientated L LE dressing c/d/i Wiggles toes, distally nvi Compartments soft/nontender  Labs:   Recent Labs  09/13/13 0451 09/14/13 0454  HGB 9.8* 9.1*   Recent Labs  09/13/13 0451 09/14/13 0454  WBC 9.7 7.7  RBC 3.39* 3.11*  HCT 29.0* 26.7*  PLT 152 124*  No results found for this basename: NA, K, CL, CO2, BUN, CREATININE, GLUCOSE, CALCIUM,  in the last 72 hours  Assessment and Plan: Syncopal episode with PT yesterday but did better in afternoon, will work with PT today again If cleared by PT, can d/c home today, order for d/c in epic and scripts on chart If not, keep here to work on more PT and will d/c home tomorrow  VTE proph: ASA 325, SCDs

## 2013-09-14 NOTE — Progress Notes (Signed)
Patient discharged home accompanied wife. Discharge instructions reviewed with patient and understanding instruction.

## 2013-09-14 NOTE — Progress Notes (Signed)
   CARE MANAGEMENT NOTE 09/14/2013  Patient:  [name], [name]   Account Number:  [id number]  Date Initiated:  09/14/2013  Documentation initiated by:  [facility]  Subjective/Objective Assessment:   adm: TOTAL KNEE ARTHROPLASTY (Left)     Action/Plan:   discharge planning   Anticipated DC Date:  09/14/2013   Anticipated DC Plan:  HOME W HOME HEALTH SERVICES      DC Planning Services  CM consult      [facility] Choice  HOME HEALTH   Choice offered to / List presented to:  C-3 Spouse   DME arranged  3-N-1  WALKER - ROLLING  CPM      DME agency  [employer]     HH arranged  HH-2 PT  HH-1 RN  HH-3 OT      HH agency  [facility]   Status of service:  Completed, signed off Medicare Important Message given?   (If response is "NO", the following Medicare IM given date fields will be blank) Date Medicare IM given:   Date Additional Medicare IM given:    Discharge Disposition:  HOME W HOME HEALTH SERVICES  Per UR Regulation:    If discussed at Long Length of Stay Meetings, dates discussed:    Comments:  09/14/13 16:55 CM spoke with spouse of pt to see if [facility] had been set pre-operatively.  She chooses [facility] for HHPT/OT/RN. TnT providing 3n1, rolling walker, CPM.  Address and contact numbers were verified.  No other CM needs were communicated.  Referral faxed to [facility].  Freddy Jaksch, BSN, CM [phone].

## 2013-09-14 NOTE — Evaluation (Signed)
Occupational Therapy Evaluation Patient Details Name: [name] MRN: [id number] DOB: 05-05-1966 Today's Date: 09/14/2013 Time: 4098-1191 OT Time Calculation (min): 23 min  OT Assessment / Plan / Recommendation History of present illness admitted for elective TKR left; patient with history of passing out easily (per patient)   Clinical Impression   Pt moving well during evaluation. Provided education to pt and spouse. Pt planning to d/c today.     OT Assessment  All further OT needs can be met in the next venue of care    Follow Up Recommendations  Home health OT;Supervision/Assistance - 24 hour (pt already set up with HHOT)    Barriers to Discharge      Equipment Recommendations  3 in 1 bedside comode    Recommendations for Other Services    Frequency       Precautions / Restrictions Precautions Precautions: Knee Required Braces or Orthoses: Knee Immobilizer - Left Knee Immobilizer - Left: On except when in CPM Restrictions Weight Bearing Restrictions: Yes LLE Weight Bearing: Weight bearing as tolerated   Pertinent Vitals/Pain Pain 5/10. Increased activity during session.    ADL  Upper Body Dressing: Set up Where Assessed - Upper Body Dressing: Supported sitting Lower Body Dressing: Min guard Where Assessed - Lower Body Dressing: Supported sit to Pharmacist, hospital: Supervision/safety Statistician Method: Sit to Barista: Raised toilet seat with arms (or 3-in-1 over toilet) Tub/Shower Transfer Method: Not assessed Equipment Used: Gait belt;Rolling walker Transfers/Ambulation Related to ADLs: Min guard for ambulation. Supervision for transfers. ADL Comments: Educated that it is beneficial to reach down to don/doff left sock and clothing as it increases ROM in knee. Educated to stand in front of bed/chair with walker in front when pulling up LB clothing. Educated on use of bag on walker to carry items and to have wife pick up rugs in  house (non skid in bathroom).  Pt states his wife is going to help him sponge bathe so he did not feel need to practice tub transfer.    OT Diagnosis: Acute pain  OT Problem List: Decreased activity tolerance;Decreased knowledge of use of DME or AE;Decreased knowledge of precautions;Pain OT Treatment Interventions:     OT Goals(Current goals can be found in the care plan section)    Visit Information  Last OT Received On: 09/14/13 Assistance Needed: +2 (for safety due to passing out easily) History of Present Illness: admitted for elective TKR left; patient with history of passing out easily (per patient)       Prior Functioning     Home Living Family/patient expects to be discharged to:: Private residence Living Arrangements: Spouse/significant other Available Help at Discharge: Family Type of Home: House Home Access: Level entry Home Layout: One level Home Equipment: None Additional Comments: wife just had baby (1 month ago) Prior Function Level of Independence: Independent Communication Communication: No difficulties         Vision/Perception     Copywriter, advertising Arousal/Alertness: Awake/alert Behavior During Therapy: WFL for tasks assessed/performed Overall Cognitive Status: Within Functional Limits for tasks assessed    Extremity/Trunk Assessment Upper Extremity Assessment Upper Extremity Assessment: Overall WFL for tasks assessed Lower Extremity Assessment Lower Extremity Assessment: Defer to PT evaluation     Mobility Bed Mobility Bed Mobility: Not assessed Transfers Transfers: Sit to Stand;Stand to Sit Sit to Stand: 5: Supervision;With upper extremity assist;From chair/3-in-1 Stand to Sit: With upper extremity assist;5: Supervision;To chair/3-in-1 Details for Transfer Assistance: Cues for technique.  Exercise     Balance     End of Session OT - End of Session Equipment Utilized During Treatment: Gait belt;Rolling walker;Left knee  immobilizer Activity Tolerance: Patient tolerated treatment well Patient left: in chair;with call bell/phone within reach;with family/visitor present Nurse Communication: Other (comment) (pt wanted to see her) CPM Left Knee CPM Left Knee: Off  GO     Earlie Raveling OTR/L [phone] 09/14/2013, 6:04 PM

## 2013-09-14 NOTE — Progress Notes (Signed)
Utilization Review Completed.[name] T11/23/2014  

## 2013-09-14 NOTE — Progress Notes (Signed)
Clinical Child psychotherapist (CSW) received consult for SNF placement. Per chart PT is recommending home health. Please resonsult if further social work needs arise. CSW signing off.   Jetta Lout, LCSWA Weekend CSW [phone]

## 2013-09-15 ENCOUNTER — Encounter (HOSPITAL_COMMUNITY): Payer: Self-pay | Admitting: Emergency Medicine

## 2013-09-15 ENCOUNTER — Emergency Department (HOSPITAL_COMMUNITY): Payer: BC Managed Care – PPO

## 2013-09-15 ENCOUNTER — Emergency Department (HOSPITAL_COMMUNITY)
Admission: EM | Admit: 2013-09-15 | Discharge: 2013-09-15 | Disposition: A | Discharge status: Home / Self Care | Payer: BC Managed Care – PPO | Attending: Emergency Medicine | Admitting: Emergency Medicine

## 2013-09-15 DIAGNOSIS — R42 Dizziness and giddiness: Secondary | ICD-10-CM | POA: Insufficient documentation

## 2013-09-15 DIAGNOSIS — D649 Anemia, unspecified: Secondary | ICD-10-CM | POA: Insufficient documentation

## 2013-09-15 DIAGNOSIS — R55 Syncope and collapse: Secondary | ICD-10-CM

## 2013-09-15 DIAGNOSIS — Z860100 Personal history of colon polyps, unspecified: Secondary | ICD-10-CM | POA: Insufficient documentation

## 2013-09-15 DIAGNOSIS — R791 Abnormal coagulation profile: Secondary | ICD-10-CM | POA: Insufficient documentation

## 2013-09-15 DIAGNOSIS — Z9889 Other specified postprocedural states: Secondary | ICD-10-CM | POA: Insufficient documentation

## 2013-09-15 DIAGNOSIS — Z7982 Long term (current) use of aspirin: Secondary | ICD-10-CM | POA: Insufficient documentation

## 2013-09-15 DIAGNOSIS — Z8619 Personal history of other infectious and parasitic diseases: Secondary | ICD-10-CM | POA: Insufficient documentation

## 2013-09-15 DIAGNOSIS — R61 Generalized hyperhidrosis: Secondary | ICD-10-CM | POA: Insufficient documentation

## 2013-09-15 DIAGNOSIS — M1990 Unspecified osteoarthritis, unspecified site: Secondary | ICD-10-CM | POA: Insufficient documentation

## 2013-09-15 DIAGNOSIS — Z79899 Other long term (current) drug therapy: Secondary | ICD-10-CM | POA: Insufficient documentation

## 2013-09-15 LAB — CBC WITH DIFFERENTIAL/PLATELET
Basophils Absolute: 0 10*3/uL (ref 0.0–0.1)
Basophils Relative: 0 % (ref 0–1)
Eosinophils Absolute: 0.2 10*3/uL (ref 0.0–0.7)
HCT: 26.6 % — ABNORMAL LOW (ref 39.0–52.0)
Lymphocytes Relative: 15 % (ref 12–46)
Lymphs Abs: 1.1 10*3/uL (ref 0.7–4.0)
MCH: 29.6 pg (ref 26.0–34.0)
MCV: 86.6 fL (ref 78.0–100.0)
Monocytes Absolute: 0.7 10*3/uL (ref 0.1–1.0)
Neutrophils Relative %: 73 % (ref 43–77)
Platelets: 140 10*3/uL — ABNORMAL LOW (ref 150–400)
RBC: 3.07 MIL/uL — ABNORMAL LOW (ref 4.22–5.81)
WBC: 7.1 10*3/uL (ref 4.0–10.5)

## 2013-09-15 LAB — BASIC METABOLIC PANEL
Creatinine, Ser: 0.77 mg/dL (ref 0.50–1.35)
GFR calc Af Amer: >90 mL/min (ref >90)
GFR calc non Af Amer: >90 mL/min (ref >90)
Glucose, Bld: 94 mg/dL (ref 70–99)
Potassium: 4.1 mEq/L (ref 3.5–5.1)
Sodium: 139 mEq/L (ref 135–145)

## 2013-09-15 LAB — PROTIME-INR
INR: 1.12 (ref 0.00–1.49)
Prothrombin Time: 14.2 seconds (ref 11.6–15.2)

## 2013-09-15 LAB — D-DIMER, QUANTITATIVE: D-Dimer, Quant: 3.03 ug/mL-FEU — ABNORMAL HIGH (ref 0.00–0.48)

## 2013-09-15 MED ORDER — SODIUM CHLORIDE 0.9 % IV BOLUS (SEPSIS)
1000.0000 mL | Freq: Once | INTRAVENOUS | Status: AC
  Administered 2013-09-15: 1000 mL via INTRAVENOUS

## 2013-09-15 MED ORDER — IOHEXOL 350 MG/ML SOLN
80.0000 mL | Freq: Once | INTRAVENOUS | Status: AC | PRN
  Administered 2013-09-15: 64 mL via INTRAVENOUS

## 2013-09-15 NOTE — ED Notes (Signed)
Patient transported to CT via stretcher.

## 2013-09-15 NOTE — ED Notes (Signed)
Pt to ED via [facility] for evaluation of 2 syncopal episodes at home in the past 2 hours.  Pt reports right knee replacement on Friday.  Denies hitting head- wife reports pt became diaphoretic during both episodes.  BP- 130/76.  Pt alert and oriented X 4 at present.

## 2013-09-15 NOTE — ED Notes (Signed)
Pt ambulated in [place] with assistance of walker.  Pt tolerated well.  Much teaching regarding sitting at side of bed, stabilizing prior to standing, etc given to patient and wife.  Both verbalized understanding.  Physician informed of results.

## 2013-09-15 NOTE — ED Notes (Signed)
Obtaining walker from 5th floor to ambulate pt to see if he tolerates well.

## 2013-09-15 NOTE — ED Notes (Signed)
Introduced self to patient.  Reports he had syncopal episode while in the hospital two days ago, then another today while at the sink.  His wife was there to catch him when his eyes rolled back into his head.  States TKA is doing well, just concerned about passing out.

## 2013-09-15 NOTE — Discharge Summary (Signed)
Patient ID: [name] MRN: [id number] DOB/AGE: 1965-11-19 47 y.o.  Admit date: 09/12/2013 Discharge date: 09/15/2013  Admission Diagnoses:  Principal Problem:   Arthritis of knee, left, far valgus   Discharge Diagnoses:  Same  Past Medical History  Diagnosis Date  . 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 Osteoarthritis     L knee  . History of chicken pox   . History of colonic polyps     Surgeries: Procedure(s): TOTAL KNEE ARTHROPLASTY on 09/12/2013   Consultants:    Discharged Condition: Improved  Hospital Course: BANNON GIAMMARCO is an 47 y.o. male who was admitted 09/12/2013 for operative treatment ofArthritis of knee, left. Patient has severe unremitting pain that affects sleep, daily activities, and work/hobbies. After pre-op clearance the patient was taken to the operating room on 09/12/2013 and underwent  Procedure(s): TOTAL KNEE ARTHROPLASTY.    Patient was given perioperative antibiotics: Anti-infectives   Start     Dose/Rate Route Frequency Ordered Stop   09/12/13 1144  cefUROXime (ZINACEF) injection  Status:  Discontinued       As needed 09/12/13 1203 09/12/13 1401   09/12/13 1110  ceFAZolin (ANCEF) 2-3 GM-% IVPB SOLR    Comments:  Hypes, Karen   : cabinet override      09/12/13 1110 09/12/13 2314   09/12/13 0600  ceFAZolin (ANCEF) IVPB 2 g/50 mL premix     2 g 100 mL/hr over 30 Minutes Intravenous On call to O.R. 09/11/13 1548 09/12/13 1130       Patient was given sequential compression devices, early ambulation, and aspirin to prevent DVT.  Patient benefited maximally from hospital stay and there were no complications.    Recent vital signs: Patient Vitals for the past 24 hrs:  BP Temp Pulse Resp SpO2  09/14/13 1344 131/63 mmHg 98.2 F (36.8 C) 91 18 100 %     Recent laboratory studies:  Recent Labs  09/13/13 0451 09/14/13 0454  WBC 9.7 7.7  HGB 9.8* 9.1*  HCT 29.0* 26.7*  PLT 152 124*     Discharge  Medications:     Medication List         aspirin EC 325 MG tablet  Take 1 tablet (325 mg total) by mouth 2 (two) times daily.     methocarbamol 500 MG tablet  Commonly known as:  ROBAXIN  Take 1 tablet (500 mg total) by mouth 2 (two) times daily with a meal.     multivitamin with minerals Tabs tablet  Take 1 tablet by mouth daily.     oxyCODONE-acetaminophen 5-325 MG per tablet  Commonly known as:  ROXICET  Take 1 tablet by mouth every 4 (four) hours as needed.        Diagnostic Studies: Dg Chest 2 View  09/05/2013   CLINICAL DATA:  Preoperative respiratory films. Patient for knee replacement.  EXAM: CHEST  2 VIEW  COMPARISON:  None.  FINDINGS: Lungs are clear. Heart size is normal. No pneumothorax or pleural effusion. Convex left thoracic curvature noted.  IMPRESSION: No acute disease.   Electronically Signed   By: Drusilla Kanner M.D.   On: 09/05/2013 13:49    Disposition: 01-Home or Self Care      Discharge Orders   Future Appointments Provider Department Dept Phone   09/17/2013 1:15 PM Newt Lukes, MD [facility] [place] [phone]   02/16/2014 10:00 AM Newt Lukes, MD [facility] [place] [phone]  Future Orders Complete By Expires   Call MD / Call 911  As directed    Comments:     If you experience chest pain or shortness of breath, CALL 911 and be transported to the hospital emergency room.  If you develope a fever above 101 F, pus (white drainage) or increased drainage or redness at the wound, or calf pain, call your surgeon's office.   Constipation Prevention  As directed    Comments:     Drink plenty of fluids.  Prune juice may be helpful.  You may use a stool softener, such as Colace (over the counter) 100 mg twice a day.  Use MiraLax (over the counter) for constipation as needed.   Diet - low sodium heart healthy  As directed    Driving restrictions  As directed    Comments:     No driving until cleared  by physician.   Increase activity slowly as tolerated  As directed       Follow-up Information   Follow up with Nestor Lewandowsky, MD In 2 weeks.   Specialty:  Orthopedic Surgery   Contact information:   [address] [place] [address] [phone]       Follow up with [facility]. (home health nurse, physical and occupational therapy)    Contact information:   [address] [place] [address] [phone]        Signed: Jiles Harold 09/15/2013, 1:06 PM

## 2013-09-15 NOTE — ED Notes (Signed)
Returned from CT, alert, no change in status.  Asking to have IV removed.  Explained need for it to remain in place until all tests resulted.

## 2013-09-15 NOTE — ED Provider Notes (Addendum)
CSN: [id number]     Arrival date & time 09/15/13  1707 History   First MD Initiated Contact with Patient 09/15/13 1715     Chief Complaint  Patient presents with  . Near Syncope   (Consider location/radiation/quality/duration/timing/severity/associated sxs/prior Treatment) HPI  This is a 47 year old male with a history of obesity status post Roux-en-Y and knee replacement surgery on Friday who presents with syncope. Patient reports that he was in the hospital on Saturday and had an episode of syncope while participating in physical therapy. He states he got very hot and dizzy.  Patient was discharged home yesterday. Today he states that he got up from his bed and walk to the bathroom and had acute onset of dizziness, sweating followed by passing out. He states his wife caught him and he did not hit his head.  Patient denies any shortness of breath or chest pain during this time period. He is on aspirin but no other anticoagulation following his knee surgery. He has no history of blood clots. Patient is currently asymptomatic.  Past Medical History  Diagnosis Date  . 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 Osteoarthritis     L knee  . History of chicken pox   . History of colonic polyps    Past Surgical History  Procedure Laterality Date  . Breath tek h pylori  02/14/2012    Procedure: BREATH TEK H PYLORI;  Surgeon: Kandis Cocking, MD;  Location: [facility];  Service: General;  Laterality: N/A;  . Knee surgery  87    Left  . Wrist surgery      LEFT child  . Tonsillectomy    . 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 Diabetes Mother 26  . Lung cancer Father 60  . Hypertension Father   . Alcohol abuse Father   . Stomach cancer Mother 35  . Hypertension Mother    History  Substance Use  Topics  . Smoking status: Never Smoker   . Smokeless tobacco: Never Used     Comment: married, lives with spouse. works with [employer], Clinical biochemist  . Alcohol Use: No    Review of Systems  Constitutional: Negative.  Negative for fever.  Respiratory: Negative.  Negative for chest tightness and shortness of breath.   Cardiovascular: Negative.  Negative for chest pain.  Gastrointestinal: Negative.  Negative for abdominal pain.  Genitourinary: Negative.  Negative for dysuria.  Musculoskeletal: Negative for back pain.  Skin: Negative for rash.  Neurological: Negative for headaches.  All other systems reviewed and are negative.    Allergies  Review of patient's allergies indicates no known allergies.  Home Medications   Current Outpatient Rx  Name  Route  Sig  Dispense  Refill  . aspirin EC 325 MG tablet   Oral   Take 1 tablet (325 mg total) by mouth 2 (two) times daily.   30 tablet   0   . methocarbamol (ROBAXIN) 500 MG tablet   Oral   Take 1 tablet (500 mg total) by mouth 2 (two) times daily with a meal.   60 tablet   0   . Multiple Vitamin (MULTIVITAMIN WITH MINERALS) TABS tablet   Oral   Take 1 tablet by mouth daily.         [name]  oxyCODONE-acetaminophen (ROXICET) 5-325 MG per tablet   Oral   Take 1 tablet by mouth every 4 (four) hours as needed.   60 tablet   0    BP 123/65  Pulse 92  Temp(Src) 97.3 F (36.3 C) (Oral)  Resp 11  Ht 5\' 8"  (1.727 m)  Wt 189 lb (85.73 kg)  BMI 28.74 kg/m2  SpO2 100% Physical Exam  ED Course  Procedures (including critical care time) Labs Review Labs Reviewed  CBC WITH DIFFERENTIAL - Abnormal; Notable for the following:    RBC 3.07 (*)    Hemoglobin 9.1 (*)    HCT 26.6 (*)    Platelets 140 (*)    All other components within normal limits  D-DIMER, QUANTITATIVE - Abnormal; Notable for the following:    D-Dimer, Quant 3.03 (*)    All other components within normal limits  BASIC METABOLIC PANEL  PROTIME-INR   POCT I-STAT TROPONIN I   Imaging Review Ct Angio Chest W/cm &/or Wo Cm  09/15/2013   CLINICAL DATA:  Syncope and diaphoresis. History of recent right knee replacement surgery.  EXAM: CT ANGIOGRAPHY CHEST WITH CONTRAST  TECHNIQUE: Multidetector CT imaging of the chest was performed using the standard protocol during bolus administration of intravenous contrast. Multiplanar CT image reconstructions including MIPs were obtained to evaluate the vascular anatomy.  CONTRAST:  64mL OMNIPAQUE IOHEXOL 350 MG/ML SOLN  COMPARISON:  None.  FINDINGS: The pulmonary arteries are adequately opacified. There is no evidence of pulmonary embolism. Lungs show no evidence of infiltrate, edema or nodule. No pleural or pericardial fluid is seen. The heart size is normal. No lymphadenopathy is identified. The thoracic aorta is of normal caliber.  Bony structures are unremarkable. The visualized upper abdomen shows changes related to gastric bypass surgery. No abnormal fluid collections are identified.  Review of the MIP images confirms the above findings.  IMPRESSION: Normal CTA of the chest demonstrating no evidence of pulmonary embolism or other acute findings.   Electronically Signed   By: Irish Lack M.D.   On: 09/15/2013 19:44    EKG Interpretation    Date/Time:  Monday September 15 2013 17:14:49 EST Ventricular Rate:  78 PR Interval:  224 QRS Duration: 86 QT Interval:  382 QTC Calculation: 435 R Axis:   83 Text Interpretation:  Sinus rhythm Prolonged PR interval No significant change since last tracing Confirmed by HORTON  MD, COURTNEY ([id number]) on 09/15/2013 10:32:30 PM            MDM   1. Syncope   2. Anemia    This is a 47 year old male who presents with syncope. He is nontoxic-appearing on exam and his vital signs within normal limits. He does not appear to be orthostatic. Patient just had a knee replacement and would be at risk for PE. He is currently on aspirin. We'll screen with a d-dimer.  Other potential causes of syncope could include pain medications, dehydration, postanesthesia, and/or anemia following surgery. Lab work was obtained and is notable for hematocrit of 26 which is stable for discharge. D-dimer is positive. CTA was obtained and shows no evidence of acute clot. Patient was ambulated with a walker without difficulty. He was also given fluids for hydration. At this time I feel he is safe for discharge home. I feel his syncope is likely multifactorial and a combination of pain medications, dehydration, and anemia following surgery. He will followup with his primary Dr. he was given strict return precautions.  After history, exam, and medical  workup I feel the patient has been appropriately medically screened and is safe for discharge home. Pertinent diagnoses were discussed with the patient. Patient was given return precautions.    Shon Baton, MD 09/15/13 2214  Shon Baton, MD 09/15/13 2232

## 2013-09-15 NOTE — ED Notes (Signed)
Pt states he stood up, five minutes later, pt LOC. Denies CP, SOB, Dizziness, numbness, tingling, pain. Pt in NAD

## 2013-09-16 ENCOUNTER — Encounter (HOSPITAL_COMMUNITY): Payer: Self-pay | Admitting: Orthopedic Surgery

## 2013-09-16 NOTE — Addendum Note (Signed)
Addendum created 09/16/13 1526 by Covey Baller, MD   Modules edited: Anesthesia Attestations    

## 2013-09-17 ENCOUNTER — Ambulatory Visit (INDEPENDENT_AMBULATORY_CARE_PROVIDER_SITE_OTHER): Payer: BC Managed Care – PPO | Admitting: Internal Medicine

## 2013-09-17 ENCOUNTER — Encounter: Payer: Self-pay | Admitting: Internal Medicine

## 2013-09-17 VITALS — BP 138/90 | HR 83 | Temp 97.6°F | Ht 68.0 in | Wt 192.5 lb

## 2013-09-17 DIAGNOSIS — R55 Syncope and collapse: Secondary | ICD-10-CM

## 2013-09-17 DIAGNOSIS — D62 Acute posthemorrhagic anemia: Secondary | ICD-10-CM

## 2013-09-17 DIAGNOSIS — M25562 Pain in left knee: Secondary | ICD-10-CM

## 2013-09-17 DIAGNOSIS — M25569 Pain in unspecified knee: Secondary | ICD-10-CM

## 2013-09-17 MED ORDER — FERROUS SULFATE 325 (65 FE) MG PO TABS: 325.0000 mg | ORAL_TABLET | Freq: Every day | ORAL | Status: DC

## 2013-09-17 MED ORDER — POLYETHYLENE GLYCOL 3350 17 GM/SCOOP PO POWD: 17.0000 g | Freq: Every day | ORAL | Status: DC

## 2013-09-17 NOTE — Progress Notes (Signed)
Pre visit review using our clinic review tool, if applicable. No additional management support is needed unless otherwise documented below in the visit note. 

## 2013-09-17 NOTE — Patient Instructions (Addendum)
It was good to see you today.  We have reviewed your ER records including labs and tests today  Continue to minimize Percocet dosing as planned until change to Norco as planned  Keep Hydrated!!  Take over the counter iron pills 2x/day for anemia  Use Miralax powder 1 scoop (17g) 1-2/day for constipation prevention/treatment  Syncope Syncope is a fainting spell. This means the person loses consciousness and drops to the ground. The person is generally unconscious for less than 5 minutes. The person may have some muscle twitches for up to 15 seconds before waking up and returning to normal. Syncope occurs more often in elderly people, but it can happen to anyone. While most causes of syncope are not dangerous, syncope can be a sign of a serious medical problem. It is important to seek medical care.  CAUSES  Syncope is caused by a sudden decrease in blood flow to the brain. The specific cause is often not determined. Factors that can trigger syncope include:  Taking medicines that lower blood pressure.  Sudden changes in posture, such as standing up suddenly.  Taking more medicine than prescribed.  Standing in one place for too long.  Seizure disorders.  Dehydration and excessive exposure to heat.  Low blood sugar (hypoglycemia).  Straining to have a bowel movement.  Heart disease, irregular heartbeat, or other circulatory problems.  Fear, emotional distress, seeing blood, or severe pain. SYMPTOMS  Right before fainting, you may:  Feel dizzy or lightheaded.  Feel nauseous.  See all white or all black in your field of vision.  Have cold, clammy skin. DIAGNOSIS  Your caregiver will ask about your symptoms, perform a physical exam, and perform electrocardiography (ECG) to record the electrical activity of your heart. Your caregiver may also perform other heart or blood tests to determine the cause of your syncope. TREATMENT  In most cases, no treatment is needed. Depending  on the cause of your syncope, your caregiver may recommend changing or stopping some of your medicines. HOME CARE INSTRUCTIONS  Have someone stay with you until you feel stable.  Do not drive, operate machinery, or play sports until your caregiver says it is okay.  Keep all follow-up appointments as directed by your caregiver.  Lie down right away if you start feeling like you might faint. Breathe deeply and steadily. Wait until all the symptoms have passed.  Drink enough fluids to keep your urine clear or pale yellow.  If you are taking blood pressure or heart medicine, get up slowly, taking several minutes to sit and then stand. This can reduce dizziness. SEEK IMMEDIATE MEDICAL CARE IF:   You have a severe headache.  You have unusual pain in the chest, abdomen, or back.  You are bleeding from the mouth or rectum, or you have black or tarry stool.  You have an irregular or very fast heartbeat.  You have pain with breathing.  You have repeated fainting or seizure-like jerking during an episode.  You faint when sitting or lying down.  You have confusion.  You have difficulty walking.  You have severe weakness.  You have vision problems. If you fainted, call your local emergency services (911 in [place] Do not drive yourself to the hospital.  MAKE SURE YOU:  Understand these instructions.  Will watch your condition.  Will get help right away if you are not doing well or get worse. Document Released: 10/09/2005 Document Revised: 04/09/2012 Document Reviewed: 12/08/2011 [facility] Patient Information 2014 [place], [place].

## 2013-09-17 NOTE — Progress Notes (Signed)
Subjective:    Patient ID: [name], male    DOB: [birth year], 47 y.o.   MRN: [id number]  HPI  Patient here today for follow up -syncope event with PT - ER visit for same reviewed (11/24)  complains of continued left knee pain. s/p L TKR 09/12/13 by Turner Daniels due to adv DJD  with locking. Pt had prior traumatic injury to left femur at age 30 with pin placement and orthopedic surgery at that time.   Past Medical History  Diagnosis Date  . 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 Osteoarthritis     L knee  . History of chicken pox   . History of colonic polyps     Review of Systems  Constitutional: Positive for fatigue. Negative for fever, chills and unexpected weight change.  Respiratory: Negative for cough, chest tightness and shortness of breath.   Cardiovascular: Negative for chest pain and palpitations.  Musculoskeletal: Positive for joint swelling. Negative for arthralgias, back pain and neck stiffness.       Left knee pain and joint swelling.   Neurological: Negative for dizziness (none in past few days), syncope (in past few days since ER eval 11/24) and light-headedness.       Objective:   Physical Exam  Constitutional: He is oriented to person, place, and time. He appears well-developed and well-nourished. No distress.  Cardiovascular: Normal rate, regular rhythm and normal heart sounds.   Pulmonary/Chest: Effort normal and breath sounds normal. No respiratory distress.  Musculoskeletal:  L knee dressing C/D/I - neuro vascularly intact distally  Neurological: He is alert and oriented to person, place, and time.  Skin: Skin is warm and dry. He is not diaphoretic.  Psychiatric: He has a normal mood and affect. His behavior is normal. Judgment and thought content normal.    Wt Readings from Last 3 Encounters:  09/17/13 192 lb 8 oz (87.317 kg)  09/15/13 189 lb (85.73 kg)  09/05/13 189 lb 11.2 oz (86.047 kg)   BP Readings from Last 3 Encounters:   09/17/13 138/90  09/15/13 123/65  09/14/13 131/63   Lab Results  Component Value Date   WBC 7.1 09/15/2013   HGB 9.1* 09/15/2013   HCT 26.6* 09/15/2013   PLT 140* 09/15/2013   GLUCOSE 94 09/15/2013   CHOL 109 04/22/2013   TRIG 36.0 04/22/2013   HDL 45.70 04/22/2013   LDLCALC 56 04/22/2013   ALT 34 04/22/2013   AST 26 04/22/2013   NA 139 09/15/2013   K 4.1 09/15/2013   CL 102 09/15/2013   CREATININE 0.77 09/15/2013   BUN 13 09/15/2013   CO2 30 09/15/2013   TSH 1.47 04/22/2013   PSA 1.60 04/22/2013   INR 1.12 09/15/2013       Assessment & Plan:   Syncope. Exacerbated by left knee pain, pain medications, postoperative anemia and potential component of anxiety. ER evaluation 48 hours ago for same unremarkable for cardiac problem, change in anemia. CT negative for PE and hemodynamically stable, no orthostatic tilt but symptoms improved with IV fluids.  As symptoms/events of near syncope (IP prior to DC and day of DC) have been precipitated by physical therapy, advised gentle engagement with PT over the next week until able to use fewer pain medications.   Also encouraged to take iron supplement daily for ABL postop anemia  The patient is reassured that these symptoms do not appear to represent a serious or threatening condition.  Time spent with  pt/family today 25 minutes, greater than 50% time spent counseling patient on syncope events, recent total knee replacement 09/12/13 and medication review. Also review of hospital records

## 2014-02-16 ENCOUNTER — Ambulatory Visit: Payer: BC Managed Care – PPO | Admitting: Internal Medicine

## 2014-02-16 DIAGNOSIS — Z0289 Encounter for other administrative examinations: Secondary | ICD-10-CM

## 2014-07-04 IMAGING — CR DG CHEST 2V
2 series · 2 of 2 positions shown · non-contrast
Comparison: None.

CLINICAL DATA: Preoperative respiratory films. Patient for knee
replacement.

EXAM:
CHEST  2 VIEW

[w chest pa]
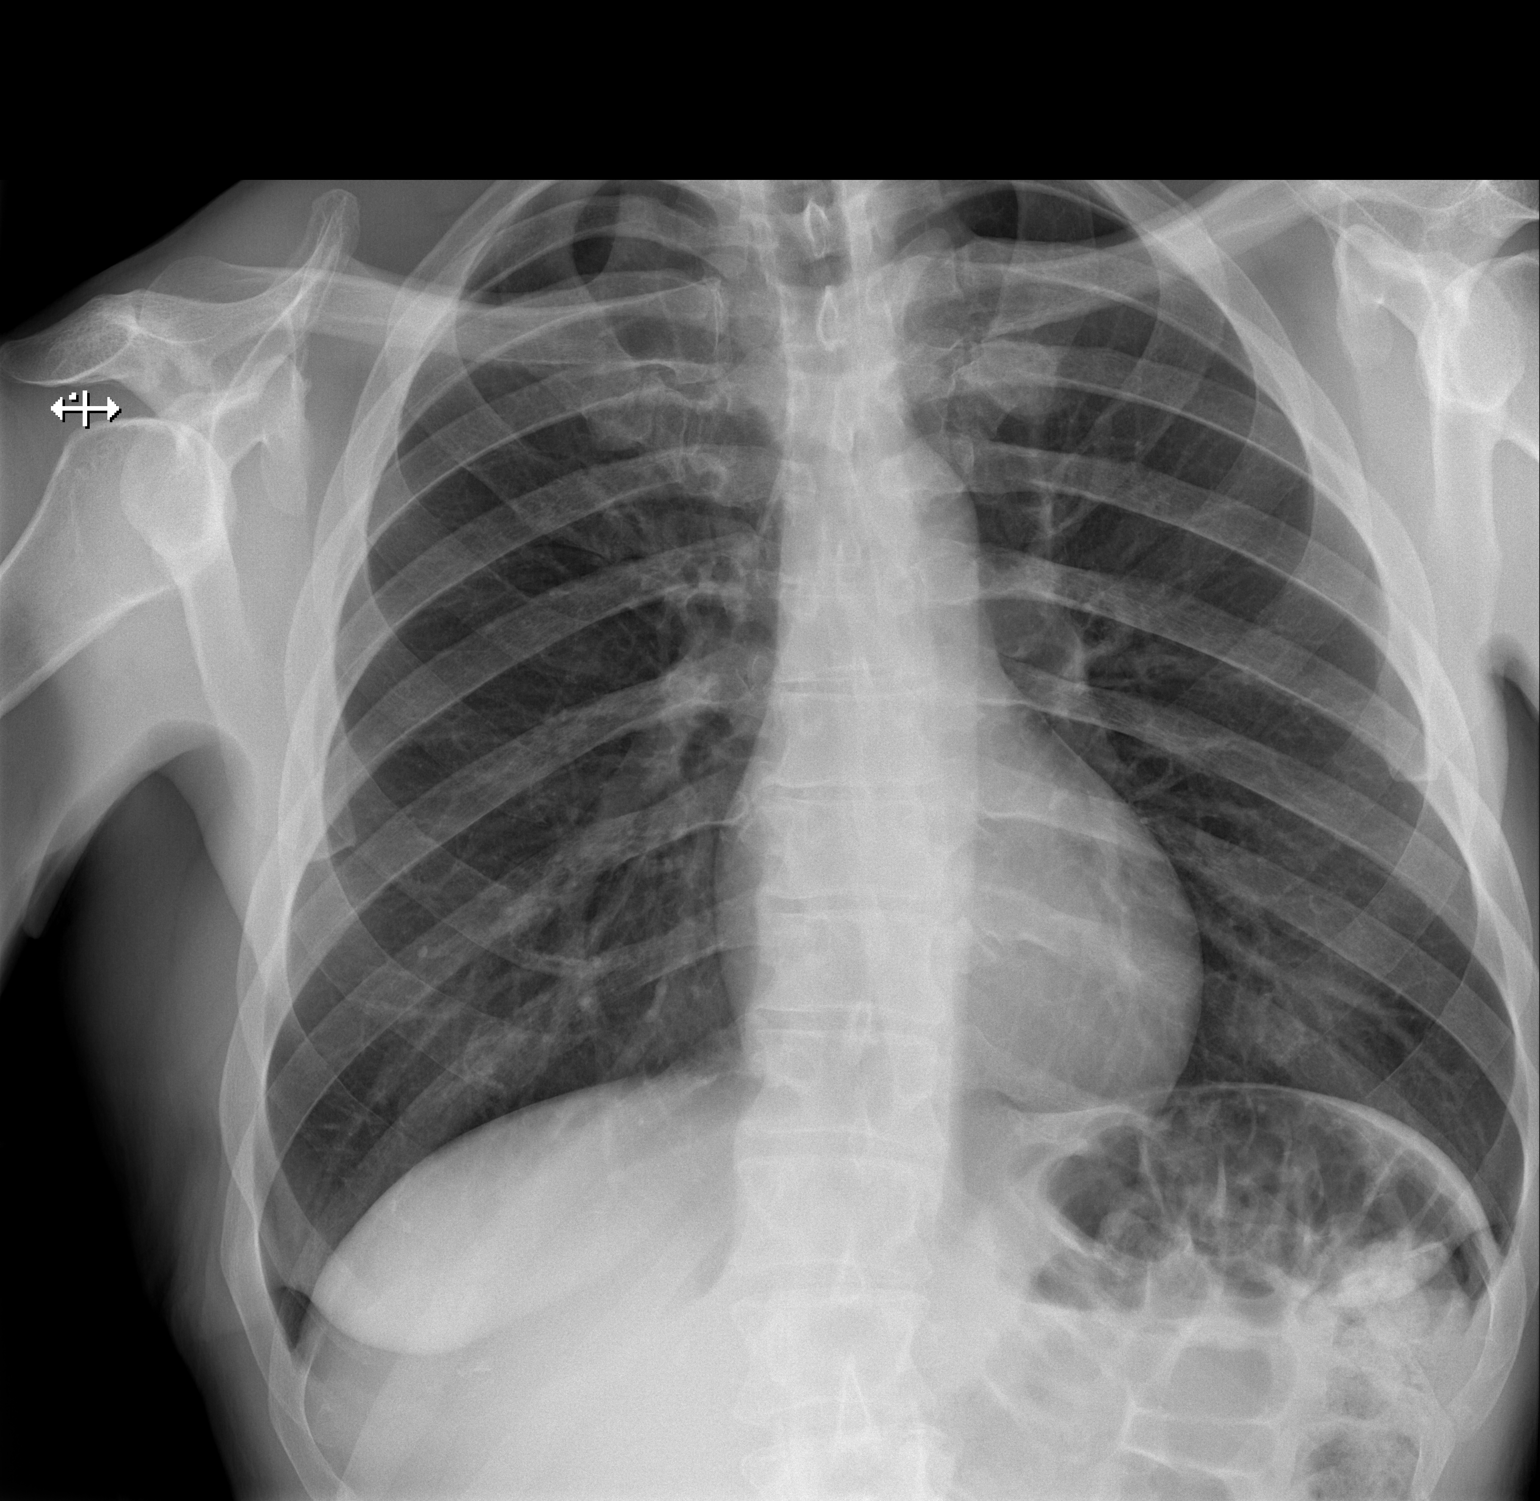

[w chest lat]
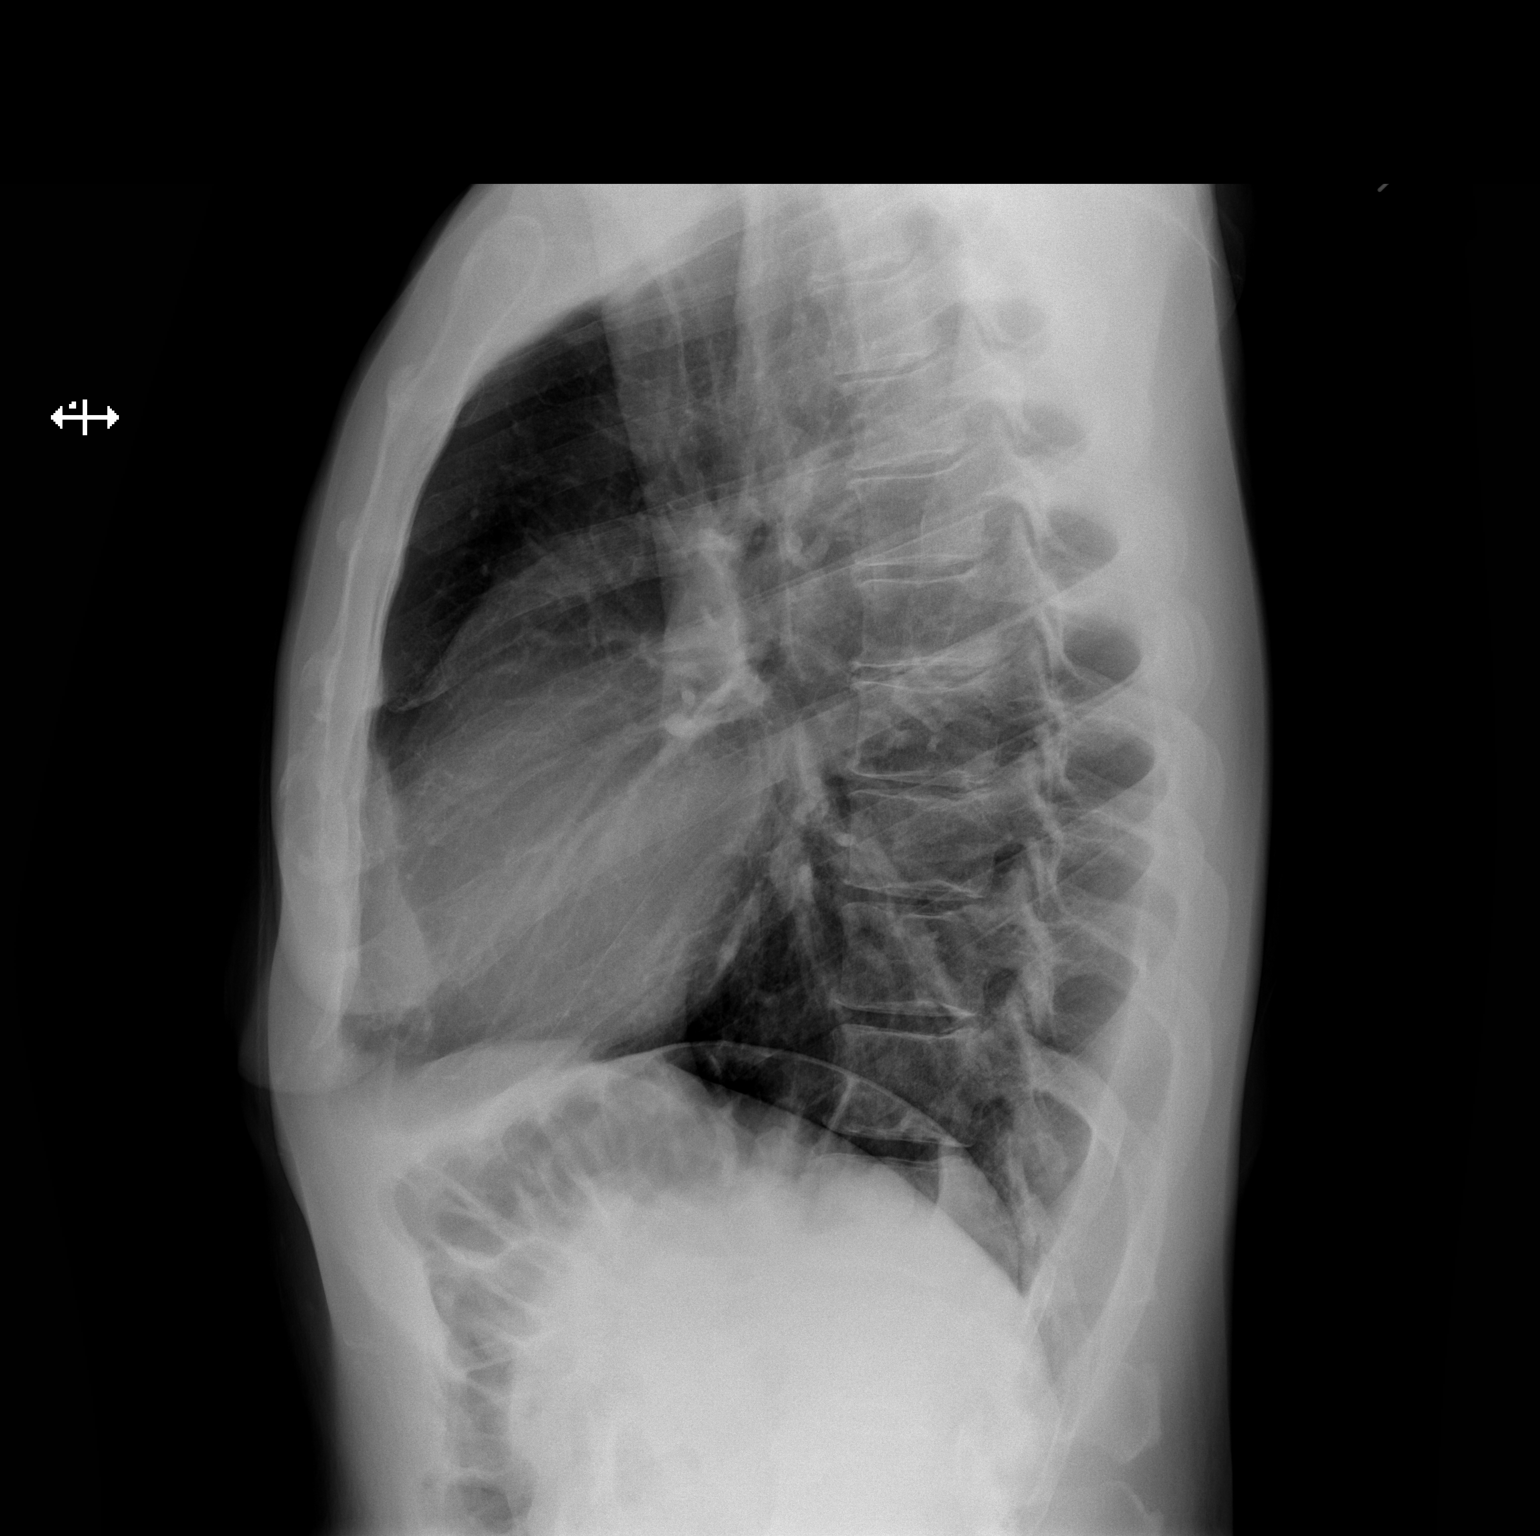

[2 of 2 positions shown; findings below may reference images not displayed]

FINDINGS: Lungs are clear. Heart size is normal. No pneumothorax or pleural
effusion. Convex left thoracic curvature noted.
IMPRESSION: No acute disease.

## 2014-09-16 ENCOUNTER — Other Ambulatory Visit: Payer: Self-pay | Admitting: Orthopedic Surgery

## 2014-10-09 ENCOUNTER — Encounter (HOSPITAL_COMMUNITY): Payer: Self-pay | Admitting: Pharmacy Technician

## 2014-10-12 ENCOUNTER — Inpatient Hospital Stay (HOSPITAL_COMMUNITY)
Admission: RE | Admit: 2014-10-12 | Discharge: 2014-10-12 | Disposition: A | Discharge status: Home / Self Care | Payer: BC Managed Care – PPO | Source: Ambulatory Visit

## 2014-10-12 NOTE — Progress Notes (Signed)
Nurse called patient and left a voicemail inquiring about scheduled 1200 PAT appointment. Direct call back number left on voicemail.

## 2014-10-15 ENCOUNTER — Encounter (HOSPITAL_COMMUNITY)
Admission: RE | Admit: 2014-10-15 | Discharge: 2014-10-15 | Disposition: A | Discharge status: Home / Self Care | Payer: BC Managed Care – PPO | Source: Ambulatory Visit | Attending: Orthopedic Surgery | Admitting: Orthopedic Surgery

## 2014-10-15 ENCOUNTER — Encounter (HOSPITAL_COMMUNITY): Payer: Self-pay

## 2014-10-15 DIAGNOSIS — Z01812 Encounter for preprocedural laboratory examination: Secondary | ICD-10-CM | POA: Diagnosis present

## 2014-10-15 HISTORY — DX: Pain in unspecified joint: M25.50

## 2014-10-15 HISTORY — DX: Dorsalgia, unspecified: M54.9

## 2014-10-15 HISTORY — DX: Personal history of colon polyps, unspecified: Z86.0100

## 2014-10-15 HISTORY — DX: Personal history of colonic polyps: Z86.010

## 2014-10-15 LAB — CBC WITH DIFFERENTIAL/PLATELET
BASOS ABS: 0 10*3/uL (ref 0.0–0.1)
BASOS PCT: 0 % (ref 0–1)
EOS PCT: 1 % (ref 0–5)
Eosinophils Absolute: 0 10*3/uL (ref 0.0–0.7)
HEMATOCRIT: 38.6 % — AB (ref 39.0–52.0)
Hemoglobin: 12.3 g/dL — ABNORMAL LOW (ref 13.0–17.0)
Lymphocytes Relative: 29 % (ref 12–46)
Lymphs Abs: 1.4 10*3/uL (ref 0.7–4.0)
MCH: 27.5 pg (ref 26.0–34.0)
MCHC: 31.9 g/dL (ref 30.0–36.0)
MCV: 86.2 fL (ref 78.0–100.0)
MONO ABS: 0.3 10*3/uL (ref 0.1–1.0)
Monocytes Relative: 5 % (ref 3–12)
Neutro Abs: 3.2 10*3/uL (ref 1.7–7.7)
Neutrophils Relative %: 65 % (ref 43–77)
Platelets: 195 10*3/uL (ref 150–400)
RBC: 4.48 MIL/uL (ref 4.22–5.81)
RDW: 12.9 % (ref 11.5–15.5)
WBC: 5 10*3/uL (ref 4.0–10.5)

## 2014-10-15 LAB — BASIC METABOLIC PANEL
Anion gap: 7 (ref 5–15)
BUN: 18 mg/dL (ref 6–23)
CALCIUM: 9 mg/dL (ref 8.4–10.5)
CO2: 26 mmol/L (ref 19–32)
CREATININE: 1 mg/dL (ref 0.50–1.35)
Chloride: 104 mEq/L (ref 96–112)
GFR calc Af Amer: >90 mL/min (ref >90)
GFR, EST NON AFRICAN AMERICAN: 89 mL/min — AB (ref >90)
GLUCOSE: 102 mg/dL — AB (ref 70–99)
Potassium: 3.7 mmol/L (ref 3.5–5.1)
Sodium: 137 mmol/L (ref 135–145)

## 2014-10-15 LAB — TYPE AND SCREEN
ABO/RH(D): A POS
Antibody Screen: NEGATIVE

## 2014-10-15 LAB — SURGICAL PCR SCREEN
MRSA, PCR: NEGATIVE
Staphylococcus aureus: NEGATIVE

## 2014-10-15 LAB — URINALYSIS, ROUTINE W REFLEX MICROSCOPIC
Bilirubin Urine: NEGATIVE
GLUCOSE, UA: NEGATIVE mg/dL
HGB URINE DIPSTICK: NEGATIVE
Ketones, ur: NEGATIVE mg/dL
Leukocytes, UA: NEGATIVE
Nitrite: NEGATIVE
Protein, ur: NEGATIVE mg/dL
SPECIFIC GRAVITY, URINE: 1.029 (ref 1.005–1.030)
UROBILINOGEN UA: 0.2 mg/dL (ref 0.0–1.0)
pH: 5 (ref 5.0–8.0)

## 2014-10-15 LAB — APTT: aPTT: 34 seconds (ref 24–37)

## 2014-10-15 LAB — PROTIME-INR
INR: 1.16 (ref 0.00–1.49)
Prothrombin Time: 14.9 seconds (ref 11.6–15.2)

## 2014-10-15 MED ORDER — CHLORHEXIDINE GLUCONATE 4 % EX LIQD: 60.0000 mL | Freq: Once | CUTANEOUS | Status: DC

## 2014-10-15 NOTE — Progress Notes (Addendum)
Medical MD is Dr.Valerie Felicity CoyerLeschber  Denies ever having an echo/stress test/heart cath  Denies EKG or CXR in past yr

## 2014-10-20 MED ORDER — DEXTROSE-NACL 5-0.45 % IV SOLN: INTRAVENOUS | Status: DC

## 2014-10-20 MED ORDER — DEXTROSE-NACL 5-0.9 % IV SOLN: INTRAVENOUS | Status: DC

## 2014-10-20 MED ORDER — CEFAZOLIN SODIUM-DEXTROSE 2-3 GM-% IV SOLR
2.0000 g | INTRAVENOUS | Status: AC
  Administered 2014-10-21: 2 g via INTRAVENOUS
  Filled 2014-10-20: qty 50

## 2014-10-20 NOTE — H&P (Signed)
TOTAL HIP ADMISSION H&P  Patient is admitted for right total hip arthroplasty.  Subjective:  Chief Complaint: right hip pain  HPI: [name], 48 y.o. male, has a history of pain and functional disability in the right hip(s) due to arthritis and patient has failed non-surgical conservative treatments for greater than 12 weeks to include NSAID's and/or analgesics, corticosteriod injections, flexibility and strengthening excercises, use of assistive devices, weight reduction as appropriate and activity modification.  Onset of symptoms was gradual starting several years ago with gradually worsening course since that time.The patient noted no past surgery on the right hip(s).  Patient currently rates pain in the right hip at 10 out of 10 with activity. Patient has night pain, worsening of pain with activity and weight bearing, pain that interfers with activities of daily living, pain with passive range of motion and crepitus. Patient has evidence of subchondral sclerosis, periarticular osteophytes and joint space narrowing by imaging studies. This condition presents safety issues increasing the risk of falls.  There is no current active infection.  Patient Active Problem List   Diagnosis Date Noted  . Arthritis of knee, left, far valgus 09/12/2013  . 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 07/03/2012  . DJD (degenerative joint disease) of knee 03/22/2012  . Morbid obesity, Weight - 349, BMI - 52.9 01/31/2012   Past Medical History  Diagnosis Date  . Osteoarthritis     L knee  . Joint pain   . Back pain     compensating from hip pain  . History of colon polyps     Past Surgical History  Procedure Laterality Date  . Breath tek h pylori  02/14/2012    Procedure: BREATH TEK H PYLORI;  Surgeon: Kandis Cockingavid H Newman, MD;  [facility];  Service: General;  Laterality: N/A;  . Knee surgery Left age at [age]    pin placed  . Wrist surgery Left     as a child  . Gastric roux-en-y   06/17/2012    Procedure: LAPAROSCOPIC ROUX-EN-Y GASTRIC;  Surgeon: Kandis Cockingavid H Newman, MD;  Location: [facility];  Service: General;  Laterality: N/A;  . Total knee arthroplasty Left 09/12/2013    Procedure: TOTAL KNEE ARTHROPLASTY;  Surgeon: Nestor LewandowskyFrank J Rowan, MD;  Location: [facility];  Service: Orthopedics;  Laterality: Left;  . Tonsillectomy      and adenoids as a child  . Knee surgery to remove pin      at age 48  . Colonoscopy    . Esophagogastroduodenoscopy      No prescriptions prior to admission   No Known Allergies  History  Substance Use Topics  . Smoking status: Never Smoker   . Smokeless tobacco: Never Used  . Alcohol Use: No    Family History  Problem Relation Age of Onset  . Diabetes Mother 1169  . Lung cancer Father 3449  . Hypertension Father   . Alcohol abuse Father   . Stomach cancer Mother [id number]  . Hypertension Mother      Review of Systems  Constitutional: Negative.   HENT: Negative.   Eyes: Positive for blurred vision.       Pt wears glasses  Respiratory: Negative.   Cardiovascular: Negative.   Gastrointestinal: Negative.   Genitourinary: Negative.   Musculoskeletal: Positive for joint pain.  Skin: Negative.   Neurological: Negative.   Endo/Heme/Allergies: Negative.   Psychiatric/Behavioral: Negative.     Objective:  Physical Exam  Constitutional: He is oriented to person, place, and time.  He appears well-developed and well-nourished.  HENT:  Head: Normocephalic and atraumatic.  Eyes: Pupils are equal, round, and reactive to light.  Neck: Normal range of motion. Neck supple.  Cardiovascular: Intact distal pulses.   Respiratory: Effort normal.  Musculoskeletal: He exhibits tenderness.  Today, the patient has good strength in his left hip.  Patient's right hip has mild weakness with hip flexion.  He does have severe pain with internal and external rotation.  He only internally rotates to approximately 5.  Externally rotates to approximately 15.  Logroll does  cause obvious pain.  He has brisk capillary refill and is neurovascularly intact distally.  Patient's left knee has good strength but does have continued reduced range of motion.  Neurological: He is alert and oriented to person, place, and time.  Skin: Skin is warm and dry.  Psychiatric: He has a normal mood and affect. His behavior is normal. Judgment and thought content normal.    Vital signs in last 24 hours:    Labs:   Estimated body mass index is 29.28 kg/(m^2) as calculated from the following:   Height as of 09/17/13: 5\' 8"  (1.727 m).   Weight as of 09/17/13: 87.317 kg (192 lb 8 oz).   Imaging Review Plain radiographs demonstrate Ap of the pelvis and one view of the right hip are taken and reviewed in office today.  Patient's right hip has end-stage arthritis with bone-on-bone over the superior lateral portion of the acetabulum and numerous osteophytes.  Patient also has lateral subluxation and collapse of the humeral head.  Assessment/Plan:  End stage arthritis, right hip(s)  The patient history, physical examination, clinical judgement of the provider and imaging studies are consistent with end stage degenerative joint disease of the right hip(s) and total hip arthroplasty is deemed medically necessary. The treatment options including medical management, injection therapy, arthroscopy and arthroplasty were discussed at length. The risks and benefits of total hip arthroplasty were presented and reviewed. The risks due to aseptic loosening, infection, stiffness, dislocation/subluxation,  thromboembolic complications and other imponderables were discussed.  The patient acknowledged the explanation, agreed to proceed with the plan and consent was signed. Patient is being admitted for inpatient treatment for surgery, pain control, PT, OT, prophylactic antibiotics, VTE prophylaxis, progressive ambulation and ADL's and discharge planning.The patient is planning to be discharged home with  home health services

## 2014-10-21 ENCOUNTER — Encounter (HOSPITAL_COMMUNITY)
Admission: RE | Disposition: A | Discharge status: Home Health | Payer: Self-pay | Source: Ambulatory Visit | Attending: Orthopedic Surgery

## 2014-10-21 ENCOUNTER — Inpatient Hospital Stay (HOSPITAL_COMMUNITY)
Admission: RE | Admit: 2014-10-21 | Discharge: 2014-10-25 | DRG: 470 | Disposition: A | Discharge status: Home Health | Payer: BC Managed Care – PPO | Source: Ambulatory Visit | Attending: Orthopedic Surgery | Admitting: Orthopedic Surgery

## 2014-10-21 ENCOUNTER — Inpatient Hospital Stay (HOSPITAL_COMMUNITY): Payer: BC Managed Care – PPO | Admitting: Certified Registered Nurse Anesthetist

## 2014-10-21 ENCOUNTER — Inpatient Hospital Stay (HOSPITAL_COMMUNITY): Payer: BC Managed Care – PPO

## 2014-10-21 ENCOUNTER — Encounter (HOSPITAL_COMMUNITY): Payer: Self-pay | Admitting: *Deleted

## 2014-10-21 DIAGNOSIS — M25551 Pain in right hip: Secondary | ICD-10-CM | POA: Diagnosis present

## 2014-10-21 DIAGNOSIS — M1611 Unilateral primary osteoarthritis, right hip: Principal | ICD-10-CM | POA: Diagnosis present

## 2014-10-21 DIAGNOSIS — I959 Hypotension, unspecified: Secondary | ICD-10-CM | POA: Diagnosis not present

## 2014-10-21 DIAGNOSIS — Z9884 Bariatric surgery status: Secondary | ICD-10-CM

## 2014-10-21 DIAGNOSIS — Z96641 Presence of right artificial hip joint: Secondary | ICD-10-CM

## 2014-10-21 DIAGNOSIS — Z96652 Presence of left artificial knee joint: Secondary | ICD-10-CM | POA: Diagnosis present

## 2014-10-21 HISTORY — PX: TOTAL HIP ARTHROPLASTY: SHX124

## 2014-10-21 SURGERY — ARTHROPLASTY, HIP, TOTAL,POSTERIOR APPROACH
Anesthesia: Monitor Anesthesia Care | Laterality: Right

## 2014-10-21 MED ORDER — BUPIVACAINE HCL (PF) 0.5 % IJ SOLN
INTRAMUSCULAR | Status: DC | PRN
  Administered 2014-10-21: 3 mL

## 2014-10-21 MED ORDER — BISACODYL 5 MG PO TBEC
5.0000 mg | DELAYED_RELEASE_TABLET | Freq: Every day | ORAL | Status: DC | PRN
  Administered 2014-10-25: 5 mg via ORAL
  Filled 2014-10-21: qty 1

## 2014-10-21 MED ORDER — KCL IN DEXTROSE-NACL 20-5-0.45 MEQ/L-%-% IV SOLN
INTRAVENOUS | Status: DC
  Administered 2014-10-22: 01:00:00 via INTRAVENOUS
  Filled 2014-10-21 (×15): qty 1000

## 2014-10-21 MED ORDER — ARTIFICIAL TEARS OP OINT
TOPICAL_OINTMENT | OPHTHALMIC | Status: AC
  Filled 2014-10-21: qty 3.5

## 2014-10-21 MED ORDER — HYDROMORPHONE HCL 1 MG/ML IJ SOLN
1.0000 mg | INTRAMUSCULAR | Status: DC | PRN
  Administered 2014-10-21 (×2): 1 mg via INTRAVENOUS
  Filled 2014-10-21 (×2): qty 1

## 2014-10-21 MED ORDER — METHOCARBAMOL 500 MG PO TABS
500.0000 mg | ORAL_TABLET | Freq: Four times a day (QID) | ORAL | Status: DC | PRN
  Administered 2014-10-24 – 2014-10-25 (×5): 500 mg via ORAL
  Filled 2014-10-21 (×5): qty 1

## 2014-10-21 MED ORDER — ACETAMINOPHEN 650 MG RE SUPP: 650.0000 mg | Freq: Four times a day (QID) | RECTAL | Status: DC | PRN

## 2014-10-21 MED ORDER — EPHEDRINE SULFATE 50 MG/ML IJ SOLN
INTRAMUSCULAR | Status: DC | PRN
  Administered 2014-10-21: 10 mg via INTRAVENOUS
  Administered 2014-10-21: 5 mg via INTRAVENOUS
  Administered 2014-10-21: 10 mg via INTRAVENOUS

## 2014-10-21 MED ORDER — ROCURONIUM BROMIDE 50 MG/5ML IV SOLN
INTRAVENOUS | Status: AC
  Filled 2014-10-21: qty 1

## 2014-10-21 MED ORDER — NAPHAZOLINE HCL 0.1 % OP SOLN
2.0000 [drp] | Freq: Four times a day (QID) | OPHTHALMIC | Status: DC | PRN
  Filled 2014-10-21: qty 15

## 2014-10-21 MED ORDER — PHENYLEPHRINE 40 MCG/ML (10ML) SYRINGE FOR IV PUSH (FOR BLOOD PRESSURE SUPPORT)
PREFILLED_SYRINGE | INTRAVENOUS | Status: AC
  Filled 2014-10-21: qty 10

## 2014-10-21 MED ORDER — STERILE WATER FOR INJECTION IJ SOLN
INTRAMUSCULAR | Status: AC
  Filled 2014-10-21: qty 10

## 2014-10-21 MED ORDER — DOCUSATE SODIUM 100 MG PO CAPS
100.0000 mg | ORAL_CAPSULE | Freq: Two times a day (BID) | ORAL | Status: DC
  Administered 2014-10-21 – 2014-10-25 (×8): 100 mg via ORAL
  Filled 2014-10-21 (×9): qty 1

## 2014-10-21 MED ORDER — ONDANSETRON HCL 4 MG/2ML IJ SOLN: 4.0000 mg | Freq: Four times a day (QID) | INTRAMUSCULAR | Status: DC | PRN

## 2014-10-21 MED ORDER — ACETAMINOPHEN 325 MG PO TABS: 650.0000 mg | ORAL_TABLET | Freq: Four times a day (QID) | ORAL | Status: DC | PRN

## 2014-10-21 MED ORDER — ASPIRIN EC 325 MG PO TBEC: 325.0000 mg | DELAYED_RELEASE_TABLET | Freq: Two times a day (BID) | ORAL | Status: DC

## 2014-10-21 MED ORDER — LIDOCAINE HCL (CARDIAC) 20 MG/ML IV SOLN
INTRAVENOUS | Status: DC | PRN
  Administered 2014-10-21: 40 mg via INTRAVENOUS

## 2014-10-21 MED ORDER — MAGNESIUM CITRATE PO SOLN: 1.0000 | Freq: Once | ORAL | Status: AC | PRN

## 2014-10-21 MED ORDER — TRANEXAMIC ACID 100 MG/ML IV SOLN
1000.0000 mg | INTRAVENOUS | Status: AC
  Administered 2014-10-21: 1000 mg via INTRAVENOUS
  Filled 2014-10-21: qty 10

## 2014-10-21 MED ORDER — FENTANYL CITRATE 0.05 MG/ML IJ SOLN
INTRAMUSCULAR | Status: AC
  Filled 2014-10-21: qty 5

## 2014-10-21 MED ORDER — OXYCODONE HCL 5 MG/5ML PO SOLN: 5.0000 mg | Freq: Once | ORAL | Status: DC | PRN

## 2014-10-21 MED ORDER — OXYCODONE HCL 5 MG PO TABS
5.0000 mg | ORAL_TABLET | ORAL | Status: DC | PRN
  Administered 2014-10-21 – 2014-10-25 (×14): 10 mg via ORAL
  Filled 2014-10-21 (×14): qty 2

## 2014-10-21 MED ORDER — METHOCARBAMOL 500 MG PO TABS: 500.0000 mg | ORAL_TABLET | Freq: Two times a day (BID) | ORAL | Status: DC

## 2014-10-21 MED ORDER — PHENOL 1.4 % MT LIQD: 1.0000 | OROMUCOSAL | Status: DC | PRN

## 2014-10-21 MED ORDER — PHENYLEPHRINE HCL 10 MG/ML IJ SOLN
INTRAMUSCULAR | Status: DC | PRN
  Administered 2014-10-21: 40 ug via INTRAVENOUS

## 2014-10-21 MED ORDER — BUPIVACAINE-EPINEPHRINE 0.5% -1:200000 IJ SOLN
INTRAMUSCULAR | Status: DC | PRN
  Administered 2014-10-21: 10 mL

## 2014-10-21 MED ORDER — 0.9 % SODIUM CHLORIDE (POUR BTL) OPTIME
TOPICAL | Status: DC | PRN
  Administered 2014-10-21: 1000 mL

## 2014-10-21 MED ORDER — PROPOFOL 10 MG/ML IV BOLUS
INTRAVENOUS | Status: AC
  Filled 2014-10-21: qty 20

## 2014-10-21 MED ORDER — MENTHOL 3 MG MT LOZG: 1.0000 | LOZENGE | OROMUCOSAL | Status: DC | PRN

## 2014-10-21 MED ORDER — HYDROMORPHONE HCL 1 MG/ML IJ SOLN: 0.2500 mg | INTRAMUSCULAR | Status: DC | PRN

## 2014-10-21 MED ORDER — MIDAZOLAM HCL 2 MG/2ML IJ SOLN
INTRAMUSCULAR | Status: AC
  Filled 2014-10-21: qty 2

## 2014-10-21 MED ORDER — DIPHENHYDRAMINE HCL 12.5 MG/5ML PO ELIX: 12.5000 mg | ORAL_SOLUTION | ORAL | Status: DC | PRN

## 2014-10-21 MED ORDER — SENNOSIDES-DOCUSATE SODIUM 8.6-50 MG PO TABS: 1.0000 | ORAL_TABLET | Freq: Every evening | ORAL | Status: DC | PRN

## 2014-10-21 MED ORDER — EPHEDRINE SULFATE 50 MG/ML IJ SOLN
INTRAMUSCULAR | Status: AC
  Filled 2014-10-21: qty 1

## 2014-10-21 MED ORDER — LIDOCAINE HCL (CARDIAC) 20 MG/ML IV SOLN
INTRAVENOUS | Status: AC
  Filled 2014-10-21: qty 20

## 2014-10-21 MED ORDER — MIDAZOLAM HCL 5 MG/5ML IJ SOLN
INTRAMUSCULAR | Status: DC | PRN
  Administered 2014-10-21: 2 mg via INTRAVENOUS

## 2014-10-21 MED ORDER — OXYCODONE HCL 5 MG PO TABS: 5.0000 mg | ORAL_TABLET | Freq: Once | ORAL | Status: DC | PRN

## 2014-10-21 MED ORDER — PROPOFOL INFUSION 10 MG/ML OPTIME
INTRAVENOUS | Status: DC | PRN
  Administered 2014-10-21: 50 ug/kg/min via INTRAVENOUS

## 2014-10-21 MED ORDER — LACTATED RINGERS IV SOLN
INTRAVENOUS | Status: DC
  Administered 2014-10-21 (×2): via INTRAVENOUS

## 2014-10-21 MED ORDER — FENTANYL CITRATE 0.05 MG/ML IJ SOLN
INTRAMUSCULAR | Status: DC | PRN
  Administered 2014-10-21 (×2): 50 ug via INTRAVENOUS

## 2014-10-21 MED ORDER — BUPIVACAINE-EPINEPHRINE (PF) 0.5% -1:200000 IJ SOLN
INTRAMUSCULAR | Status: AC
  Filled 2014-10-21: qty 30

## 2014-10-21 MED ORDER — ONDANSETRON HCL 4 MG PO TABS: 4.0000 mg | ORAL_TABLET | Freq: Four times a day (QID) | ORAL | Status: DC | PRN

## 2014-10-21 MED ORDER — METHOCARBAMOL 1000 MG/10ML IJ SOLN
500.0000 mg | Freq: Four times a day (QID) | INTRAMUSCULAR | Status: DC | PRN
  Filled 2014-10-21: qty 5

## 2014-10-21 MED ORDER — OXYCODONE-ACETAMINOPHEN 5-325 MG PO TABS: 1.0000 | ORAL_TABLET | ORAL | Status: DC | PRN

## 2014-10-21 MED ORDER — METOCLOPRAMIDE HCL 10 MG PO TABS: 5.0000 mg | ORAL_TABLET | Freq: Three times a day (TID) | ORAL | Status: DC | PRN

## 2014-10-21 MED ORDER — ONDANSETRON HCL 4 MG/2ML IJ SOLN
INTRAMUSCULAR | Status: AC
  Filled 2014-10-21: qty 2

## 2014-10-21 MED ORDER — ASPIRIN EC 325 MG PO TBEC
325.0000 mg | DELAYED_RELEASE_TABLET | Freq: Every day | ORAL | Status: DC
  Administered 2014-10-22 – 2014-10-25 (×4): 325 mg via ORAL
  Filled 2014-10-21 (×5): qty 1

## 2014-10-21 MED ORDER — METOCLOPRAMIDE HCL 5 MG/ML IJ SOLN: 5.0000 mg | Freq: Three times a day (TID) | INTRAMUSCULAR | Status: DC | PRN

## 2014-10-21 SURGICAL SUPPLY — 56 items
BLADE SAW SGTL 18X1.27X75 (BLADE) ×2 IMPLANT
BLADE SAW SGTL 18X1.27X75MM (BLADE) ×1
BRUSH FEMORAL CANAL (MISCELLANEOUS) IMPLANT
CAPT HIP TOTAL 2 ×2 IMPLANT
COVER BACK TABLE 24X17X13 BIG (DRAPES) IMPLANT
COVER SURGICAL LIGHT HANDLE (MISCELLANEOUS) ×6 IMPLANT
DRAPE IMP U-DRAPE 54X76 (DRAPES) ×3 IMPLANT
DRAPE ORTHO SPLIT 77X108 STRL (DRAPES) ×3
DRAPE PROXIMA HALF (DRAPES) ×3 IMPLANT
DRAPE SURG ORHT 6 SPLT 77X108 (DRAPES) ×1 IMPLANT
DRAPE U-SHAPE 47X51 STRL (DRAPES) ×3 IMPLANT
DRILL BIT 7/64X5 (BIT) ×3 IMPLANT
DRSG AQUACEL AG ADV 3.5X10 (GAUZE/BANDAGES/DRESSINGS) ×3 IMPLANT
DURAPREP 26ML APPLICATOR (WOUND CARE) ×3 IMPLANT
ELECT BLADE 4.0 EZ CLEAN MEGAD (MISCELLANEOUS)
ELECT REM PT RETURN 9FT ADLT (ELECTROSURGICAL) ×3
ELECTRODE BLDE 4.0 EZ CLN MEGD (MISCELLANEOUS) IMPLANT
ELECTRODE REM PT RTRN 9FT ADLT (ELECTROSURGICAL) ×1 IMPLANT
GAUZE XEROFORM 1X8 LF (GAUZE/BANDAGES/DRESSINGS) ×1 IMPLANT
GLOVE BIO SURGEON STRL SZ7.5 (GLOVE) ×3 IMPLANT
GLOVE BIO SURGEON STRL SZ8.5 (GLOVE) ×6 IMPLANT
GLOVE BIOGEL PI IND STRL 8 (GLOVE) ×2 IMPLANT
GLOVE BIOGEL PI IND STRL 9 (GLOVE) ×1 IMPLANT
GLOVE BIOGEL PI INDICATOR 8 (GLOVE) ×4
GLOVE BIOGEL PI INDICATOR 9 (GLOVE) ×2
GOWN STRL REUS W/ TWL LRG LVL3 (GOWN DISPOSABLE) ×2 IMPLANT
GOWN STRL REUS W/ TWL XL LVL3 (GOWN DISPOSABLE) ×3 IMPLANT
GOWN STRL REUS W/TWL LRG LVL3 (GOWN DISPOSABLE) ×6
GOWN STRL REUS W/TWL XL LVL3 (GOWN DISPOSABLE) ×3
HANDPIECE INTERPULSE COAX TIP (DISPOSABLE)
HOOD PEEL AWAY FACE SHEILD DIS (HOOD) ×6 IMPLANT
KIT BASIN OR (CUSTOM PROCEDURE TRAY) ×3 IMPLANT
KIT ROOM TURNOVER OR (KITS) ×3 IMPLANT
MANIFOLD NEPTUNE II (INSTRUMENTS) ×3 IMPLANT
NEEDLE 22X1 1/2 (OR ONLY) (NEEDLE) ×3 IMPLANT
NS IRRIG 1000ML POUR BTL (IV SOLUTION) ×3 IMPLANT
PACK TOTAL JOINT (CUSTOM PROCEDURE TRAY) ×3 IMPLANT
PACK UNIVERSAL I (CUSTOM PROCEDURE TRAY) ×3 IMPLANT
PAD ARMBOARD 7.5X6 YLW CONV (MISCELLANEOUS) ×8 IMPLANT
PASSER SUT SWANSON 36MM LOOP (INSTRUMENTS) ×3 IMPLANT
PRESSURIZER FEMORAL UNIV (MISCELLANEOUS) IMPLANT
SET HNDPC FAN SPRY TIP SCT (DISPOSABLE) IMPLANT
SUT ETHIBOND 2 V 37 (SUTURE) ×3 IMPLANT
SUT VIC AB 0 CTB1 27 (SUTURE) ×3 IMPLANT
SUT VIC AB 1 CTX 36 (SUTURE) ×3
SUT VIC AB 1 CTX36XBRD ANBCTR (SUTURE) ×1 IMPLANT
SUT VIC AB 2-0 CTB1 (SUTURE) ×3 IMPLANT
SUT VIC AB 3-0 SH 27 (SUTURE) ×3
SUT VIC AB 3-0 SH 27X BRD (SUTURE) ×1 IMPLANT
SYR CONTROL 10ML LL (SYRINGE) ×3 IMPLANT
TOWEL OR 17X24 6PK STRL BLUE (TOWEL DISPOSABLE) ×3 IMPLANT
TOWEL OR 17X26 10 PK STRL BLUE (TOWEL DISPOSABLE) ×3 IMPLANT
TOWER CARTRIDGE SMART MIX (DISPOSABLE) IMPLANT
TRAY CATH 16FR W/PLASTIC CATH (SET/KITS/TRAYS/PACK) ×2 IMPLANT
TRAY FOLEY CATH 14FR (SET/KITS/TRAYS/PACK) IMPLANT
WATER STERILE IRR 1000ML POUR (IV SOLUTION) ×4 IMPLANT

## 2014-10-21 NOTE — Op Note (Signed)
OPERATIVE REPORT    DATE OF PROCEDURE:  10/21/2014       PREOPERATIVE DIAGNOSIS:  RIGHT HIP OSTEOARTHRITIS                                                          POSTOPERATIVE DIAGNOSIS:  right hip osteoarthritis                                                           PROCEDURE:  R total hip arthroplasty using a 52 mm DePuy Pinnacle  Cup, [place]ody Energypex Hole Eliminator, 10-degree polyethylene liner index superior  and posterior, a +0 36 mm ceramic head, a 20x15x42x150 SROM stem, 20FL Sleeve   SURGEON: [place] Dalporto J    ASSISTANT:   Eric K. Gaylene BrooksPhillips PA-C  (present throughout entire procedure and necessary for timely completion of the procedure)   ANESTHESIA: General BLOOD LOSS: 500 FLUID REPLACEMENT: 1800 crystalloid   INDICATIONS FOR PROCEDURE: A 48 y.o. year-old With  RIGHT HIP OSTEOARTHRITIS   for 5 years, x-rays show bone-on-bone arthritic changes. Despite conservative measures with observation, anti-inflammatory medicine, narcotics, use of a cane, has severe unremitting pain and can ambulate only a few blocks before resting.  Patient desires elective R total hip arthroplasty to decrease pain and increase function. The risks, benefits, and alternatives were discussed at length including but not limited to the risks of infection, bleeding, nerve injury, stiffness, blood clots, the need for revision surgery, cardiopulmonary complications, among others, and they were willing to proceed. Questions answered     PROCEDURE IN DETAIL: The patient was identified by armband,  received preoperative IV antibiotics in [facility] holding [facility], taken to the operating room , appropriate anesthetic monitors  were attached and general endotracheal anesthesia induced. Foley catheter was inserted. Pt was rolled into the L lateral decubitus position and fixed there with a Stulberg Mark II pelvic clamp.  The R lower extremity was then prepped and draped  in the usual sterile fashion from the  ankle to the hemipelvis. A time-out  procedure was performed. The skin along the lateral hip and thigh  infiltrated with 10 mL of 0.5% Marcaine and epinephrine solution. We  then made a posterolateral approach to the hip. With a #10 blade, a 15 cm  incision was made through the skin and subcutaneous tissue down to the level of the  IT band. Small bleeders were identified and cauterized. The IT band was cut in  line with skin incision exposing the greater trochanter. A Cobra retractor was placed between the gluteus minimus and the superior hip joint capsule, and a spiked Cobra between the quadratus femoris and the inferior hip joint capsule. This isolated the short  external rotators and piriformis tendons. These were tagged with a #2 Ethibond  suture and cut off their insertion on the intertrochanteric crest. The posterior  capsule was then developed into an acetabular-based flap from Posterior Superior off of the acetabulum out over the femoral neck and back posterior inferior to the acetabular rim. This flap was tagged with two #2 Ethibond sutures and retracted protecting the sciatic nerve. This exposed  the arthritic femoral head and osteophytes. The hip was then flexed and internally rotated, dislocating the femoral head and a standard neck cut performed 1 fingerbreadth above the lesser trochanter.  A spiked Cobra was placed in the cotyloid notch and a Hohmann retractor was then used to lever the femur anteriorly off of the anterior pelvic column. A posterior-inferior wing retractor was placed at the junction of the acetabulum and the ischium completing the acetabular exposure.We then removed the peripheral osteophytes and labrum from the acetabulum. We then reamed the acetabulum up to 51 mm with basket reamers obtaining good coverage in all quadrants. We then irrigated with normal  saline solution and hammered into place a 52 mm pinnacle cup in 45  degrees of abduction and about 20 degrees of  anteversion. More  peripheral osteophytes removed and a trial 10-degree liner placed with the  index superior-posterior. The hip was then flexed and internally rotated exposing the  proximal femur, which was entered with the initiating reamer followed by  the axial reamers up to a 15.5 mm full depth and 16mm partial depth. We then conically reamed to 56F to the correct depth for a 42 base neck. The calcar was milled to 56FL. A trial cone and stem was inserted in the 25 degrees anteversion, with a +0 36mm trial head. Trial reduction was then performed and excellent stability was noted with at 90 of flexion with 75 of internal rotation and then full extension with maximal external rotation. The hip could not be dislocated in full extension. The knee could easily flex  to about 130 degrees. We also stretched the abductors at this point,  because of the preexisting adductor contractures. All trial components  were then removed. The acetabulum was irrigated out with normal saline  solution. A titanium [facility] Eliminator was then screwed into place  followed by a 10-degree polyethylene liner index superior-posterior. On  the femoral side a 56FL ZTT1 sleeve was hammered into place, followed by a 20x15x42x150 SROM stem in 25 degrees of anteversion. At this point, a +0 36 mm ceramic head was  hammered on the stem. The hip was reduced. We checked our stability  one more time and found it to be excellent. The wound was once again  thoroughly irrigated out with normal saline solution pulse lavage. The  capsular flap and short external rotators were repaired back to the  intertrochanteric crest through drill holes with a #2 Ethibond suture.  The IT band was closed with running 1 Vicryl suture. The subcutaneous  tissue with 0 and 2-0 undyed Vicryl suture and the skin with running  interlocking 3-0 nylon suture. Dressing of Xeroform and Mepilex was  then applied. The patient was then unclamped, rolled supine,  awaken extubated and taken to recovery room without difficulty in stable condition.   Holly Pring J 10/21/2014, 1:59 PM

## 2014-10-21 NOTE — Anesthesia Preprocedure Evaluation (Addendum)
Anesthesia Evaluation  Patient identified by MRN, date of birth, ID band Patient awake    Reviewed: Allergy & Precautions, H&P , NPO status , Patient's Chart, lab work & pertinent test results  History of Anesthesia Complications Negative for: history of anesthetic complications  Airway Mallampati: II  TM Distance: >3 FB Neck ROM: Full    Dental  (+) Teeth Intact   Pulmonary neg pulmonary ROS,  breath sounds clear to auscultation        Cardiovascular negative cardio ROS  Rhythm:Regular     Neuro/Psych negative neurological ROS  negative psych ROS   GI/Hepatic negative GI ROS, Neg liver ROS,   Endo/Other  negative endocrine ROS  Renal/GU negative Renal ROS     Musculoskeletal  (+) Arthritis -, Osteoarthritis,    Abdominal   Peds  Hematology negative hematology ROS (+)   Anesthesia Other Findings   Reproductive/Obstetrics                             Anesthesia Physical Anesthesia Plan  ASA: I  Anesthesia Plan: MAC and Spinal   Post-op Pain Management:    Induction: Intravenous  Airway Management Planned: Natural Airway and Nasal Cannula  Additional Equipment: None  Intra-op Plan:   Post-operative Plan:   Informed Consent: I have reviewed the patients History and Physical, chart, labs and discussed the procedure including the risks, benefits and alternatives for the proposed anesthesia with the patient or authorized representative who has indicated his/her understanding and acceptance.   Dental advisory given  Plan Discussed with: CRNA and Surgeon  Anesthesia Plan Comments:         Anesthesia Quick Evaluation

## 2014-10-21 NOTE — Progress Notes (Signed)
While NT was getting patient from bed to chair, patient passed out briefly. No fall occurred.l Patient placed back into bed. Slightly diaphoretic, cool washcloth applied. VSS.

## 2014-10-21 NOTE — Interval H&P Note (Signed)
History and Physical Interval Note:  10/21/2014 12:22 PM  Laurena SpiesMaurice C Schepers  has presented today for surgery, with the diagnosis of RIGHT HIP OSTEOARTHRITIS  The various methods of treatment have been discussed with the patient and family. After consideration of risks, benefits and other options for treatment, the patient has consented to  Procedure(s): TOTAL HIP ARTHROPLASTY (Right) as a surgical intervention .  The patient's history has been reviewed, patient examined, no change in status, stable for surgery.  I have reviewed the patient's chart and labs.  Questions were answered to the patient's satisfaction.     Nestor LewandowskyOWAN,Talley Kreiser J

## 2014-10-21 NOTE — Anesthesia Procedure Notes (Signed)
Spinal Patient location during procedure: OR Staffing Anesthesiologist: Shannan Garfinkel, CHRIS Preanesthetic Checklist Completed: patient identified, surgical consent, pre-op evaluation, timeout performed, IV checked, risks and benefits discussed and monitors and equipment checked Spinal Block Patient position: sitting Prep: site prepped and draped and DuraPrep Patient monitoring: heart rate, cardiac monitor, continuous pulse ox and blood pressure Approach: midline Location: L3-4 Injection technique: single-shot Needle Needle type: Pencan  Needle gauge: 24 G Needle length: 10 cm Assessment Sensory level: T8   

## 2014-10-21 NOTE — Transfer of Care (Signed)
Immediate Anesthesia Transfer of Care Note  Patient: Anthony SpiesMaurice C Rhodes  Procedure(s) Performed: Procedure(s): TOTAL HIP ARTHROPLASTY (Right)  Patient Location: PACU  Anesthesia Type:Spinal MAC  Level of Consciousness: awake, alert  and oriented  Airway & Oxygen Therapy: Patient Spontanous Breathing  Post-op Assessment: Report given to PACU RN and Post -op Vital signs reviewed and stable  Post vital signs: Reviewed and stable  Complications: No apparent anesthesia complications

## 2014-10-21 NOTE — Anesthesia Postprocedure Evaluation (Signed)
  Anesthesia Post-op Note  Patient: Anthony SpiesMaurice C Proby  Procedure(s) Performed: Procedure(s): TOTAL HIP ARTHROPLASTY (Right)  Patient Location: PACU  Anesthesia Type: Spinal/MAC  Level of Consciousness: awake and alert   Airway and Oxygen Therapy: Patient Spontanous Breathing  Post-op Pain: mild  Post-op Assessment: Post-op Vital signs reviewed, Patient's Cardiovascular Status Stable and Respiratory Function Stable. No residual motor block.  Post-op Vital Signs: Reviewed  Filed Vitals:   10/21/14 1615  BP:   Pulse: 67  Temp:   Resp: 14    Complications: No apparent anesthesia complications

## 2014-10-21 NOTE — Progress Notes (Signed)
UR Completed  Isaias CowmanJeannette Oliveras-Aizpurua, RN, BSN

## 2014-10-22 ENCOUNTER — Encounter (HOSPITAL_COMMUNITY): Payer: Self-pay | Admitting: Orthopedic Surgery

## 2014-10-22 LAB — BASIC METABOLIC PANEL
Anion gap: 4 — ABNORMAL LOW (ref 5–15)
BUN: 10 mg/dL (ref 6–23)
CHLORIDE: 104 meq/L (ref 96–112)
CO2: 28 mmol/L (ref 19–32)
CREATININE: 0.78 mg/dL (ref 0.50–1.35)
Calcium: 8.6 mg/dL (ref 8.4–10.5)
GFR calc Af Amer: >90 mL/min (ref >90)
Glucose, Bld: 126 mg/dL — ABNORMAL HIGH (ref 70–99)
POTASSIUM: 4.4 mmol/L (ref 3.5–5.1)
SODIUM: 136 mmol/L (ref 135–145)

## 2014-10-22 LAB — CBC
HEMATOCRIT: 26.8 % — AB (ref 39.0–52.0)
HEMOGLOBIN: 8.8 g/dL — AB (ref 13.0–17.0)
MCH: 27.9 pg (ref 26.0–34.0)
MCHC: 32.8 g/dL (ref 30.0–36.0)
MCV: 85.1 fL (ref 78.0–100.0)
PLATELETS: 155 10*3/uL (ref 150–400)
RBC: 3.15 MIL/uL — ABNORMAL LOW (ref 4.22–5.81)
RDW: 12.6 % (ref 11.5–15.5)
WBC: 7 10*3/uL (ref 4.0–10.5)

## 2014-10-22 MED ORDER — INFLUENZA VAC SPLIT QUAD 0.5 ML IM SUSY
0.5000 mL | PREFILLED_SYRINGE | INTRAMUSCULAR | Status: AC
  Administered 2014-10-23: 0.5 mL via INTRAMUSCULAR
  Filled 2014-10-22: qty 0.5

## 2014-10-22 NOTE — Evaluation (Signed)
Occupational Therapy Evaluation Patient Details Name: [name] MRN: [id number] DOB: 24-Sep-1966 Today's Date: 10/22/2014    History of Present Illness Pt is a 48 y.o. male s/p Rt THA (posterior).    Clinical Impression   This 48 year old man was admitted for posterior R THA.  Pt was limited today by diopharesis and has a h/o passing out easily when taking pain medication.  Educated on THPs and ADLs and seen only from chair level.  Pt will benefit from continued OT to increase safety and independence with adls/toilet transfers following precautions.  Goals are for min guard level for selected ADLs. Pt was independent prior to sx.    Follow Up Recommendations  Home health OT    Equipment Recommendations  None recommended by OT (has 3:1 commode)    Recommendations for Other Services       Precautions / Restrictions Precautions Precautions: Fall;Posterior Hip Precaution Booklet Issued: Yes (comment) Precaution Comments: given handout and reviewed precautions with pt; pt with hx of multiple "passing out" episodes  Restrictions RLE Weight Bearing: Weight bearing as tolerated      Mobility Bed Mobility               General bed mobility comments: not tested in OT  Transfers                 General transfer comment: not tested in OT:  Pt needed min A  x 2 for sit to stand and mod A x 2 for SPT with PT earlier for safety    Balance                                            ADL Overall ADL's : Needs assistance/impaired                                       General ADL Comments: Pt was diopharetic with PT earlier--up in chair.  Reviewed hip precautions in relationship to ADLs and demonstrated AE.  Recommended that pt have a reacher for home as he will be alone for 8 hours at times, when his wife is working.  Explained use of reacher for donning pants/underwear, holding washcloth to wash below leg and doffing socks in addition  to retrieving dropped items.  Pt held reacher and practiced opening closing but did not feel like simulate donning pants.  Pt will likely have wife don non-skid socks.  Pt had several questions about posterior precautions and wants to ensure that he doesn't break them.  Assisted him to scoot back into chair more comfortably     Vision                     Perception     Praxis      Pertinent Vitals/Pain Pain Assessment: No/denies pain     Hand Dominance     Extremity/Trunk Assessment Upper Extremity Assessment Upper Extremity Assessment: Overall WFL for tasks assessed           Communication Communication Communication: No difficulties   Cognition Arousal/Alertness: Awake/alert Behavior During Therapy: WFL for tasks assessed/performed Overall Cognitive Status: Within Functional Limits for tasks assessed  General Comments       Exercises       Shoulder Instructions      Home Living Family/patient expects to be discharged to:: Private residence Living Arrangements: Spouse/significant other Available Help at Discharge: Family;Friend(s);Neighbor;Available 24 hours/day               Bathroom Shower/Tub: Tub/shower unit Shower/tub characteristics: Curtain FirefighterBathroom Toilet: Standard     Home Equipment: Environmental consultantWalker - 2 wheels;Bedside commode;Shower seat   Additional Comments: Pt feels he will sponge bathe for quite awhile; aware of tub bench--not interested      Prior Functioning/Environment Level of Independence: Independent             OT Diagnosis: Generalized weakness   OT Problem List: Decreased strength;Decreased activity tolerance;Decreased knowledge of use of DME or AE;Decreased knowledge of precautions   OT Treatment/Interventions: Self-care/ADL training;DME and/or AE instruction;Patient/family education    OT Goals(Current goals can be found in the care plan section) Acute Rehab OT Goals Patient Stated Goal: to  go home by Saturday OT Goal Formulation: With patient Time For Goal Achievement: 10/29/14 Potential to Achieve Goals: Good ADL Goals Pt Will Transfer to Toilet: with min guard assist;ambulating;bedside commode;stand pivot transfer Pt Will Perform Toileting - Clothing Manipulation and hygiene: with min guard assist;sit to/from stand Additional ADL Goal #1: pt will express understanding of and comfort with posterior THPs during ADLs/toilet transfers and demonstrate use of reacher with set up for adls  OT Frequency: Min 2X/week   Barriers to D/C:            Co-evaluation              End of Session    Activity Tolerance: Other (comment) (pt only seen at chair level due to diaphoresis earlier) Patient left: in chair   Time: 1400-1421 OT Time Calculation (min): 21 min Charges:  OT General Charges $OT Visit: 1 Procedure OT Evaluation $Initial OT Evaluation Tier I: 1 Procedure OT Treatments $Therapeutic Activity: 8-22 mins G-Codes:    Kirrah Mustin 10/22/2014, 4:39 PM [name], OTR/L [phone] 10/22/2014

## 2014-10-22 NOTE — Progress Notes (Signed)
Physical Therapy Treatment Patient Details Name: [name] MRN: [id number] DOB: Feb 02, 1966 Today's Date: 10/22/2014    History of Present Illness Pt is a 48 y.o. male s/p Rt THA (posterior).     PT Comments    Pt continues to be limited due to lightheadedness. Pt BP in reclined position 116/64, sitting upright is 112/58, standing 148/100, pt denied any dizziness with standing but upon walking ~12 feet became lightheaded and when returned to seated position BP was 89/57. Will cont to follow per POC.   Follow Up Recommendations  Home health PT;Supervision/Assistance - 24 hour     Equipment Recommendations  None recommended by PT    Recommendations for Other Services       Precautions / Restrictions Precautions Precautions: Fall;Posterior Hip Precaution Booklet Issued: Yes (comment) Precaution Comments: pt able to independently recall 2/3 precautions  Restrictions Weight Bearing Restrictions: Yes RLE Weight Bearing: Weight bearing as tolerated    Mobility  Bed Mobility               General bed mobility comments: up in chair and returned to chair  Transfers Overall transfer level: Needs assistance Equipment used: Rolling walker (2 wheeled) Transfers: Sit to/from Stand Sit to Stand: Min assist         General transfer comment: cues for hand placement and precautions; min (A) to balance  Ambulation/Gait Ambulation/Gait assistance: Min assist;+2 safety/equipment Ambulation Distance (Feet): 12 Feet Assistive device: Rolling walker (2 wheeled) Gait Pattern/deviations: Step-to pattern;Decreased stance time - right;Decreased step length - left;Antalgic Gait velocity: decreased Gait velocity interpretation: Below normal speed for age/gender General Gait Details: cues for upright posture and gt sequencing; 2nd person for safety; pt became lightheaded and required immediate return to chair; see vitals    Stairs            Wheelchair Mobility     Modified Rankin (Stroke Patients Only)       Balance Overall balance assessment: Needs assistance Sitting-balance support: Feet supported;No upper extremity supported Sitting balance-Leahy Scale: Fair Sitting balance - Comments: denied any dizziness sitting upright   Standing balance support: During functional activity;Bilateral upper extremity supported Standing balance-Leahy Scale: Poor Standing balance comment: RW to balance                     Cognition Arousal/Alertness: Awake/alert Behavior During Therapy: WFL for tasks assessed/performed Overall Cognitive Status: Within Functional Limits for tasks assessed                      Exercises Total Joint Exercises Ankle Circles/Pumps: AROM;Both;15 reps;Seated Quad Sets: AROM;Strengthening;Right;10 reps;Seated Gluteal Sets: 10 reps Hip ABduction/ADduction: AROM;Strengthening;Right;10 reps;Seated    General Comments General comments (skin integrity, edema, etc.): see impression for vitals; pt + orthostatic       Pertinent Vitals/Pain Pain Assessment: No/denies pain    Home Living Family/patient expects to be discharged to:: Private residence Living Arrangements: Spouse/significant other Available Help at Discharge: Family;Friend(s);Neighbor;Available 24 hours/day         Home Equipment: Walker - 2 wheels;Bedside commode;Shower seat Additional Comments: Pt feels he will sponge bathe for quite awhile; aware of tub bench--not interested    Prior Function Level of Independence: Independent          PT Goals (current goals can now be found in the care plan section) Acute Rehab PT Goals Patient Stated Goal: to not get dizzy PT Goal Formulation: With patient Time For Goal Achievement: 10/29/14 Potential to  Achieve Goals: Good Progress towards PT goals: Progressing toward goals    Frequency  7X/week    PT Plan Current plan remains appropriate    Co-evaluation             End of Session  Equipment Utilized During Treatment: Gait belt Activity Tolerance: Other (comment) (+ orthostatic with walking) Patient left: in chair;with call bell/phone within reach     Time: 1504-1530 PT Time Calculation (min) (ACUTE ONLY): 26 min  Charges:  $Gait Training: 8-22 mins $Therapeutic Exercise: 8-22 mins                    G CodesDonell Sievert:      Wilson Sample N, [place] CarolinaPT  [phone] 10/22/2014, 4:46 PM

## 2014-10-22 NOTE — Evaluation (Signed)
Physical Therapy Evaluation Patient Details Name: [name] MRN: [id number] DOB: [birth year] Today's Date: 10/22/2014   History of Present Illness  Pt is a 48 y.o. male s/p Rt THA (posterior).   Clinical Impression  Pt is s/p Rt THA POD#1 resulting in the deficits listed below (see PT Problem List). Pt will benefit from skilled PT to increase their independence and safety with mobility to allow discharge to the venue listed below. Pt with significant hx of "passing out" episodes. Pt very diaphoretic and lightheaded with mobility this session. Tolerating sitting upright in chair. RN made aware.      Follow Up Recommendations Home health PT;Supervision/Assistance - 24 hour    Equipment Recommendations  None recommended by PT    Recommendations for Other Services OT consult     Precautions / Restrictions Precautions Precautions: Fall;Posterior Hip Precaution Booklet Issued: Yes (comment) Precaution Comments: given handout and reviewed precautions with pt; pt with hx of multiple "passing out" episodes  Restrictions Weight Bearing Restrictions: Yes RLE Weight Bearing: Weight bearing as tolerated      Mobility  Bed Mobility Overal bed mobility: Needs Assistance Bed Mobility: Supine to Sit     Supine to sit: Min assist;HOB elevated     General bed mobility comments: (A) to advance Rt LE to/off EOB; cues for precautions   Transfers Overall transfer level: Needs assistance Equipment used: Rolling walker (2 wheeled) Transfers: Sit to/from UGI CorporationStand;Stand Pivot Transfers Sit to Stand: From elevated surface;+2 safety/equipment;Min assist Stand pivot transfers: From elevated surface;Mod assist;+2 safety/equipment       General transfer comment: performed sit to stand x 2; pt c/o lightheadedness and dizziness with initial sit to stand and required returning to EOB; mod (A) x 2 to perform SPT safely; cues for precautions and sequencing; pt diaphoretic when reaching chair; LEs  elevated; pt communicating but "faint" feeling in chair initially; instructed to perform AP's   Ambulation/Gait             General Gait Details: unable to assess due to lightheadedness   Stairs            Wheelchair Mobility    Modified Rankin (Stroke Patients Only)       Balance                                             Pertinent Vitals/Pain Pain Assessment: No/denies pain    Home Living Family/patient expects to be discharged to:: Private residence Living Arrangements: Spouse/significant other Available Help at Discharge: Family;Friend(s);Neighbor;Available 24 hours/day Type of Home: House Home Access: Level entry     Home Layout: One level Home Equipment: Walker - 2 wheels;Bedside commode;Shower seat Additional Comments: pt has to step up and over into tub     Prior Function Level of Independence: Independent               Hand Dominance        Extremity/Trunk Assessment   Upper Extremity Assessment: Defer to OT evaluation           Lower Extremity Assessment: RLE deficits/detail RLE Deficits / Details: hip 2/5; quad 2+/5    Cervical / Trunk Assessment: Normal  Communication   Communication: No difficulties  Cognition Arousal/Alertness: Awake/alert Behavior During Therapy: WFL for tasks assessed/performed Overall Cognitive Status: Within Functional Limits for tasks assessed  General Comments      Exercises Total Joint Exercises Ankle Circles/Pumps: AROM;Both;20 reps;Supine;Seated      Assessment/Plan    PT Assessment Patient needs continued PT services  PT Diagnosis Difficulty walking;Generalized weakness;Acute pain   PT Problem List Decreased strength;Decreased range of motion;Decreased activity tolerance;Decreased balance;Decreased knowledge of use of DME;Decreased mobility;Decreased knowledge of precautions;Pain  PT Treatment Interventions DME instruction;Gait  training;Functional mobility training;Therapeutic activities;Therapeutic exercise;Balance training;Neuromuscular re-education;Patient/family education   PT Goals (Current goals can be found in the Care Plan section) Acute Rehab PT Goals Patient Stated Goal: to go home by Saturday PT Goal Formulation: With patient Time For Goal Achievement: 10/29/14 Potential to Achieve Goals: Good    Frequency 7X/week   Barriers to discharge        Co-evaluation               End of Session Equipment Utilized During Treatment: Gait belt Activity Tolerance: Other (comment);Patient limited by fatigue Patient left: in chair;with call bell/phone within reach Nurse Communication: Mobility status;Precautions;Weight bearing status         Time: [id number] PT Time Calculation (min) (ACUTE ONLY): 20 min   Charges:   PT Evaluation $Initial PT Evaluation Tier I: 1 Procedure PT Treatments $Therapeutic Activity: 8-22 mins   PT G CodesDonell Rhodes:        [name], [place]  [phone] 10/22/2014, 10:57 AM

## 2014-10-22 NOTE — Progress Notes (Signed)
Spoke with patient about HH and DME arrangements.  Pt reports having no information about HH services.  Pt reports that he already has a rolling walker and the bedside commode from a previous surgery.  He did not have a shower chair but states willing to work with existing items he has at home until his mobility is restored.  Carlyle LipaMichelle Lacey Wallman, RN BSN MHA CCM  Case Manager, Trauma Service/Unit 22[facility] [phone]

## 2014-10-22 NOTE — Progress Notes (Signed)
Patient ID: Anthony SpiesMaurice C Whitfill, male   DOB: 1966-04-14, 48 y.o.   MRN: [id number] PATIENT ID: Anthony SpiesMaurice C Cashatt  MRN: [id number]  DOB/AGE:  1966-04-14 / 48 y.o.  1 Day Post-Op Procedure(s) (LRB): TOTAL HIP ARTHROPLASTY (Right)    PROGRESS NOTE Subjective: Patient is alert, oriented, no Nausea, no Vomiting, yes passing gas, no Bowel Movement. Taking PO well. Denies SOB, Chest or Calf Pain. Using Incentive Spirometer, PAS in place. Ambulate WBAT, went to stand last night after surgery and became dizzy. Making urine well Patient reports pain as 3 on 0-10 scale  .    Objective: Vital signs in last 24 hours: Filed Vitals:   10/22/14 0000 10/22/14 0114 10/22/14 0400 10/22/14 0450  BP:  111/61  108/58  Pulse:  80  75  Temp:  98.7 F (37.1 C)  98.6 F (37 C)  TempSrc:  Oral  Oral  Resp: 18 17 16 16   Height:      Weight:      SpO2: 100% 100% 100% 100%      Intake/Output from previous day: I/O last 3 completed shifts: In: 1120 [P.O.:120; I.V.:1000] Out: 1900 [Urine:1600; Blood:300]   Intake/Output this shift:     LABORATORY DATA:  Recent Labs  10/22/14 0517  WBC 7.0  HGB 8.8*  HCT 26.8*  PLT 155  NA 136  K 4.4  CL 104  CO2 28  BUN 10  CREATININE 0.78  GLUCOSE 126*  CALCIUM 8.6    Examination: Neurologically intact ABD soft Neurovascular intact Sensation intact distally Intact pulses distally Dorsiflexion/Plantar flexion intact Incision: dressing C/D/I No cellulitis present Compartment soft} XR AP&Lat of hip shows well placed\fixed THA  Assessment:   1 Day Post-Op Procedure(s) (LRB): TOTAL HIP ARTHROPLASTY (Right) ADDITIONAL DIAGNOSIS:  Expected Acute Blood Loss Anemia, history of gastric bypass surgery  Plan: PT/OT WBAT, THA  posterior precautions, monitor hemoglobin  DVT Prophylaxis: SCDx72 hrs, ASA 325 mg BID x 2 weeks  DISCHARGE PLAN: Home  DISCHARGE NEEDS: HHPT, HHRN, CPM, Walker and 3-in-1 comode seat

## 2014-10-22 NOTE — Progress Notes (Signed)
CARE MANAGEMENT NOTE 10/22/2014  Patient:  Laurena SpiesHALL,Ajmal C   Account Number:  [id number]  Date Initiated:  10/22/2014  Documentation initiated by:  [facility],Khris Jansson  Subjective/Objective Assessment:   s/p rt total hip arthroplasty     Action/Plan:   PT eval- recommended HHPT   Anticipated DC Date:  10/23/2014   Anticipated DC Plan:  HOME W HOME HEALTH SERVICES      DC Planning Services  CM consult      [facility]   Choice offered to / List presented to:  C-1 Patient        HH arranged  HH-2 PT      [facility]   Status of service:  Completed, signed off Medicare Important Message given?   (If response is "NO", the following Medicare IM given date fields will be blank) Date Medicare IM given:   Medicare IM given by:   Date Additional Medicare IM given:   Additional Medicare IM given by:    Discharge Disposition:  HOME W HOME HEALTH SERVICES  Per UR Regulation:  Reviewed for med. necessity/level of care/duration of stay  If discussed at Long Length of Stay Meetings, dates discussed:    Comments:  10/22/14 [facility]  [facility]. Patient states that he has a rolling walker and 3N1 at home. No equipment needs identified. Jacquelynn CreeMary Perla Echavarria RN, BSN, CCM

## 2014-10-23 LAB — CBC
HCT: 27.1 % — ABNORMAL LOW (ref 39.0–52.0)
Hemoglobin: 8.8 g/dL — ABNORMAL LOW (ref 13.0–17.0)
MCH: 27.6 pg (ref 26.0–34.0)
MCHC: 32.5 g/dL (ref 30.0–36.0)
MCV: 85 fL (ref 78.0–100.0)
PLATELETS: 153 10*3/uL (ref 150–400)
RBC: 3.19 MIL/uL — AB (ref 4.22–5.81)
RDW: 12.6 % (ref 11.5–15.5)
WBC: 8.3 10*3/uL (ref 4.0–10.5)

## 2014-10-23 NOTE — Progress Notes (Signed)
Physical Therapy Treatment Patient Details Name: [name] MRN: [id number] DOB: 1965/12/17 Today's Date: 10/23/2014    History of Present Illness Pt is a 49 y.o. male s/p Rt THA (posterior).     PT Comments    Pt continues to be limited by orthostatic hypotension. Pt performed upper and lower extremity exercises x 10 minutes at EOB prior to standing/walking and was limited by dizziness/hypotension. Walked x 3 with max distance 15 ft. Pt reports he will be alone at home during days as wife works, and currently making slow progress with therapies. Consider inpatient rehab consult if this continues through the weekend.   Follow Up Recommendations  Home health PT; Supervision/Assistance - 24 hour (pt currently does NOT have)     Equipment Recommendations  None recommended by PT    Recommendations for Other Services       Precautions / Restrictions Precautions Precautions: Fall;Posterior Hip Precaution Booklet Issued: Yes (comment) Precaution Comments: pt able to independently recall 3/3 precautions; vc to maintain during activity Restrictions Weight Bearing Restrictions: Yes RLE Weight Bearing: Weight bearing as tolerated    Mobility  Bed Mobility Overal bed mobility: Needs Assistance Bed Mobility: Supine to Sit     Supine to sit: Min assist     General bed mobility comments: HOB 0; min assist to move RLE off bed  Transfers Overall transfer level: Needs assistance Equipment used: Rolling walker (2 wheeled) Transfers: Sit to/from Stand Sit to Stand: Min guard         General transfer comment: prior to transfer, pt sat EOB x 10 minutes with BP monitoring and seated exercises; cues for hand placement and precautions  Ambulation/Gait Ambulation/Gait assistance: Min assist;+2 safety/equipment Ambulation Distance (Feet): 25 Feet (8, sit, 25, sit, 15 ft) Assistive device: Rolling walker (2 wheeled) Gait Pattern/deviations: Step-to pattern;Decreased stride  length;Antalgic Gait velocity: decreased   General Gait Details: cues for upright posture and gt sequencing; 2nd person for safety; pt became lightheaded each time he walked and required immediate return to chair; see vitals    Stairs            Wheelchair Mobility    Modified Rankin (Stroke Patients Only)       Balance     Sitting balance-Leahy Scale: Fair Sitting balance - Comments: denied any dizziness sitting upright     Standing balance-Leahy Scale: Poor                      Cognition Arousal/Alertness: Awake/alert Behavior During Therapy: WFL for tasks assessed/performed Overall Cognitive Status: Within Functional Limits for tasks assessed                      Exercises Total Joint Exercises Ankle Circles/Pumps: AROM;Both;Seated;20 reps Long Arc Quad: AROM;Right;10 reps;Seated Other Exercises Other Exercises: bil UE hand pumps, elbow flexion, shoulder flexion, shoulder horizontal adduction--all repeated alternately several times; see vitals    General Comments        Pertinent Vitals/Pain Supine BP 111/62 (MAP 73) Sitting BP 118/50 (65) Sit after exercises x 5 minutes 110/65 (73) Sit after exercises x 10 minutes 121/65 (80) Seated after walk 99/57 (68)   Pain Assessment: No/denies pain    Home Living                      Prior Function            PT Goals (current goals can now be found in  the care plan section) Acute Rehab PT Goals Patient Stated Goal: to not get dizzy Progress towards PT goals: Progressing toward goals    Frequency  7X/week    PT Plan Current plan remains appropriate    Co-evaluation             End of Session Equipment Utilized During Treatment: Gait belt Activity Tolerance: Other (comment) (+ orthostatic with walking) Patient left: in chair;with call bell/phone within reach     Time: 1048-1130 PT Time Calculation (min) (ACUTE ONLY): 42 min  Charges:  $Gait Training: 23-37  mins $Therapeutic Exercise: 8-22 mins                    G Codes:      Lamontae Ricardo 11-14-2014, 1:23 PM Pager [phone]

## 2014-10-23 NOTE — Progress Notes (Signed)
PATIENT ID: [name]  MRN: [id number]  DOB/AGE:  47-06-1966 / 49 y.o.  2 Days Post-Op Procedure(s) (LRB): TOTAL HIP ARTHROPLASTY (Right)    PROGRESS NOTE Subjective: Patient is alert, oriented, no Nausea, no Vomiting, yes passing gas, no Bowel Movement. Taking PO well. Denies SOB, Chest or Calf Pain. Using Incentive Spirometer, PAS in place. Ambulate WBAT, though he has had limited PT due to hypotension Patient reports pain as mild and moderate  .    Objective: Vital signs in last 24 hours: Filed Vitals:   10/22/14 0450 10/22/14 1341 10/22/14 2000 10/23/14 0500  BP: 108/58 113/64 116/58 114/64  Pulse: 75 81 82 88  Temp: 98.6 F (37 C) 98.4 F (36.9 C) 98.1 F (36.7 C) 98.9 F (37.2 C)  TempSrc: Oral  Oral Oral  Resp: Height:      Weight:      SpO2: 100% 100% 100% 99%      Intake/Output from previous day: I/O last 3 completed shifts: In: 120 [P.O.:120] Out: 4150 [Urine:4150]   Intake/Output this shift: Total I/O In: -  Out: 425 [Urine:425]   LABORATORY DATA:  Recent Labs  10/22/14 0517 10/23/14 0513  WBC 7.0 8.3  HGB 8.8* 8.8*  HCT 26.8* 27.1*  PLT 155 153  NA 136  --   K 4.4  --   CL 104  --   CO2 28  --   BUN 10  --   CREATININE 0.78  --   GLUCOSE 126*  --   CALCIUM 8.6  --     Examination: Neurologically intact Neurovascular intact Sensation intact distally Intact pulses distally Dorsiflexion/Plantar flexion intact Incision: dressing C/D/I No cellulitis present Compartment soft} XR AP&Lat of hip shows well placed\fixed THA  Assessment:   2 Days Post-Op Procedure(s) (LRB): TOTAL HIP ARTHROPLASTY (Right) ADDITIONAL DIAGNOSIS:  Expected Acute Blood Loss Anemia, hx of gastric bypass  Plan: PT/OT WBAT, THA  posterior precautions  DVT Prophylaxis: SCDx72 hrs, ASA 325 mg BID x 2 weeks  DISCHARGE PLAN: Skilled Nursing Facility/Rehab, when bed available  DISCHARGE NEEDS: HHPT, HHRN, Walker and 3-in-1 comode seat

## 2014-10-23 NOTE — Clinical Social Work Note (Signed)
CSW received consult regarding patient requesting placement.  Patient has [employer].  The surgery was pre-planned.  Typically with [employer] and [employer] and a planned surgery the insurance states a prior plan for supervision should have been in place.  Patient states that wife can provide supervision assistance, but is concerned about his 'spells' when working with PT.  Patient states he had these same 'spells' when he had his knee surgery- of which resolved without complication.  Per PA, patient is not medically stable today.  PA projected dc Saturday/Sunday pending progress with PT.  Patient is requesting a portable toilet riser.  RNCM aware.  Patient is agreeable to home with home health therapy and per patient has already been set up with [place].  Disposition: Home with Home Health Services  CSW signing off.  Vickii Penna, LCSWA [phone]  Psychiatric & Orthopedics (5N 1-16) Clinical Social Worker

## 2014-10-24 LAB — CBC
HCT: 25.5 % — ABNORMAL LOW (ref 39.0–52.0)
Hemoglobin: 8.6 g/dL — ABNORMAL LOW (ref 13.0–17.0)
MCH: 29.3 pg (ref 26.0–34.0)
MCHC: 33.7 g/dL (ref 30.0–36.0)
MCV: 86.7 fL (ref 78.0–100.0)
Platelets: 145 10*3/uL — ABNORMAL LOW (ref 150–400)
RBC: 2.94 MIL/uL — AB (ref 4.22–5.81)
RDW: 12.7 % (ref 11.5–15.5)
WBC: 8.1 10*3/uL (ref 4.0–10.5)

## 2014-10-24 NOTE — Progress Notes (Signed)
Physical Therapy Treatment Patient Details Name: [name] MRN: [id number] DOB: 06-23-1966 Today's Date: 10/24/2014    History of Present Illness Pt is a 49 y.o. male s/p Rt THA (posterior). +orthostatic hypotension    PT Comments    Pt tolerated ambulation this date without dizziness/orthostasis (max distance 62 ft). Anticipate d/c home with HHPT 10/25/14 if remains asymptomatic with mobility.   Follow Up Recommendations  Home health PT;Supervision/Assistance - 24 hour     Equipment Recommendations  None recommended by PT    Recommendations for Other Services       Precautions / Restrictions Precautions Precautions: Fall;Posterior Hip Precaution Booklet Issued: Yes (comment) Precaution Comments: pt able to independently recall 3/3 precautions; vc x 2 to maintain during activity Restrictions Weight Bearing Restrictions: Yes RLE Weight Bearing: Weight bearing as tolerated    Mobility  Bed Mobility Overal bed mobility: Needs Assistance Bed Mobility: Sit to Supine       Sit to supine: Min assist   General bed mobility comments: HOB 0; educated pt on use of sheet to assist RLE  Transfers Overall transfer level: Needs assistance Equipment used: Rolling walker (2 wheeled) Transfers: Sit to/from Stand Sit to Stand: Supervision         General transfer comment: prior to transfer, pt sat EOC x 2 minutes with UE exercisesBP monitoring and seated exercises; cues for hand placement and precautions  Ambulation/Gait Ambulation/Gait assistance: Min guard Ambulation Distance (Feet): 62 Feet (seated rest, 62 ft) Assistive device: Rolling walker (2 wheeled) Gait Pattern/deviations: Step-to pattern;Decreased stride length Gait velocity: decreased   General Gait Details: no cues needed for sequencing; close follow with chair, however pt with no dizziness; required seated rest due to "cramp" in hip   Stairs            Wheelchair Mobility    Modified Rankin  (Stroke Patients Only)       Balance     Sitting balance-Leahy Scale: Fair Sitting balance - Comments: denied any dizziness sitting upright   Standing balance support: Bilateral upper extremity supported;During functional activity Standing balance-Leahy Scale: Poor Standing balance comment: UE support via RW                    Cognition Arousal/Alertness: Awake/alert Behavior During Therapy: WFL for tasks assessed/performed Overall Cognitive Status: Within Functional Limits for tasks assessed                      Exercises Total Joint Exercises Ankle Circles/Pumps: AROM;Both;10 reps Quad Sets: AROM;Right;5 reps Short Arc Quad: AROM;Right;10 reps Heel Slides: AROM;Right;10 reps Hip ABduction/ADduction: AAROM;Right;5 reps Long Arc Quad: AROM;Right;10 reps;Seated Other Exercises Other Exercises: bil UE hand pumps, elbow flexion, shoulder flexion, prior to standing    General Comments        Pertinent Vitals/Pain Pain Assessment: No/denies pain    Home Living                      Prior Function            PT Goals (current goals can now be found in the care plan section) Acute Rehab PT Goals Patient Stated Goal: to not get dizzy Progress towards PT goals: Progressing toward goals    Frequency  7X/week    PT Plan Current plan remains appropriate    Co-evaluation             End of Session Equipment Utilized During Treatment: Gait belt  Activity Tolerance: Patient tolerated treatment well Patient left: with call bell/phone within reach;in bed     Time: 1419-1453 PT Time Calculation (min) (ACUTE ONLY): 34 min  Charges:  $Gait Training: 8-22 mins $Therapeutic Exercise: 8-22 mins                    G Codes:      Weslyn Holsonback 11/03/14, 3:25 PM Pager [phone]

## 2014-10-24 NOTE — Progress Notes (Addendum)
Occupational Therapy Treatment Patient Details Name: QUAVON KEISLING MRN: [id number] DOB: 1966/02/05 Today's Date: 10/24/2014    History of present illness Pt is a 49 y.o. male s/p Rt THA (posterior).    OT comments  Pt. Reports feeling "much better today".  Able to transfer multiple times during skilled OT session and complete ADLS with no reports of dizziness.  Verbalizing and adhering to hip precautions during functional tasks.  Wife is purchasing A/E for LB ADLS.  Pt. States he will sponge bathe initially.  Pt. Eager for d/c home and will need HHOT for continued therapies for increasing independence.    Orthostatic BPs listed Below:  Sitting before activity: 102/53 Standing before activity: 138/97 Standing after 3 min.: 123/60, also seated after activity  Follow Up Recommendations  Home health OT    Equipment Recommendations  None recommended by OT    Recommendations for Other Services      Precautions / Restrictions Precautions Precautions: Fall;Posterior Hip Precaution Comments: pt able to independently recall 3/3 precautions; vc to maintain during activity Restrictions RLE Weight Bearing: Weight bearing as tolerated       Mobility Bed Mobility Overal bed mobility: Needs Assistance Bed Mobility: Supine to Sit     Supine to sit: Supervision     General bed mobility comments: hob flat, no rails increased time but pt. able to sit and guide each LE off of bed.  attempted rolling but pt. unable to maintain hip precautions and did not like how it felt on operated extremity.  preferred supine to sit and guiding each LE off of bed   Transfers Overall transfer level: Needs assistance Equipment used: Rolling walker (2 wheeled) Transfers: Sit to/from [facility] Sit to Stand: Min guard Stand pivot transfers: Min guard       General transfer comment: pt. able to follow instructions for controlled stand/sit and demonstrated walker safety and adhering to hip  precautions during pivots and turns    Balance                                   ADL Overall ADL's : Needs assistance/impaired Eating/Feeding: Set up;Sitting   Grooming: Wash/dry hands;Oral care;Supervision/safety;Standing         Lower Body Bathing Details (indicate cue type and reason): declined review with A/E but states wife is Brewing technologist from gift shop       Lower Body Dressing Details (indicate cue type and reason): declined review with A/E but states wife is planning to purchase from gift shop Toilet Transfer: Min guard;Adhering to hip precautions;Ambulation;BSC;Comfort height toilet;Grab bars;RW Armed forces technical officer Details (indicate cue type and reason): cues for sequencing and hand placement during transfer [place] and Hygiene: Min guard;Sit to/from stand;Cueing for compensatory techniques;Adhering to hip precautions     Tub/Shower Transfer Details (indicate cue type and reason): pt. declined, states he will sponge bathe initially Functional mobility during ADLs: Min guard General ADL Comments: pt. feeling much better per his report.  no c/o or noted dizziness during functional tasks and positional changes.  demonstrates safe tech. while adhering to hip precautions.  eager for d/c home tom.  reviewed techniques for assisting wtih care of 64 month old and also ways to assist with cooking.  discussed energy conservation and sitting when able to cut veggies, and change diapers ect.       Vision  Perception     Praxis      Cognition   Behavior During Therapy: WFL for tasks assessed/performed Overall Cognitive Status: Within Functional Limits for tasks assessed                       Extremity/Trunk Assessment               Exercises     Shoulder Instructions       General Comments  pt. Did all of the cooking prior to admit and is wanting to return to that as able.  Also has 32 month  old that he wants to help take care of and play with at home.    Pertinent Vitals/ Pain       Pain Assessment: No/denies pain  Home Living                                          Prior Functioning/Environment              Frequency Min 2X/week     Progress Toward Goals  OT Goals(current goals can now be found in the care plan section)  Progress towards OT goals: Progressing toward goals     Plan Discharge plan remains appropriate    Co-evaluation                 End of Session Equipment Utilized During Treatment: Gait belt;Rolling walker   Activity Tolerance Patient tolerated treatment well   Patient Left in chair;with call bell/phone within reach   Nurse Communication          Time: 2440-1027 OT Time Calculation (min): 39 min  Charges: OT General Charges $OT Visit: 1 Procedure OT Treatments $Self Care/Home Management : 38-52 mins  Janice Coffin 10/24/2014, 7:55 AM

## 2014-10-24 NOTE — Progress Notes (Signed)
PATIENT ID: [name]  MRN: [id number]  DOB/AGE:  [birth year] / 49 y.o.  3 Days Post-Op Procedure(s) (LRB): TOTAL HIP ARTHROPLASTY (Right)    PROGRESS NOTE Subjective: Patient is alert, oriented, no Nausea, no Vomiting, yes passing gas, no Bowel Movement. Taking PO well. Denies SOB, Chest or Calf Pain. Using Incentive Spirometer, PAS in place. Ambulate WBAT Patient reports pain as mild  .   Pt did have several episodes of lightheadedness which has limited therapy.   Objective: Vital signs in last 24 hours: Filed Vitals:   10/24/14 0000 10/24/14 0400 10/24/14 0604 10/24/14 0747  BP:   110/53 103/53  Pulse:   78   Temp:   98.2 F (36.8 C)   TempSrc:   Oral   Resp: Height:      Weight:      SpO2: 100% 100% 99%       Intake/Output from previous day: I/O last 3 completed shifts: In: 960 [P.O.:960] Out: 1725 [Urine:1725]   Intake/Output this shift:     LABORATORY DATA:  Recent Labs  10/22/14 0517 10/23/14 0513 10/24/14 0311  WBC 7.0 8.3 8.1  HGB 8.8* 8.8* 8.6*  HCT 26.8* 27.1* 25.5*  PLT 155 153 145*  NA 136  --   --   K 4.4  --   --   CL 104  --   --   CO2 28  --   --   BUN 10  --   --   CREATININE 0.78  --   --   GLUCOSE 126*  --   --   CALCIUM 8.6  --   --     Examination: Neurologically intact Neurovascular intact Sensation intact distally Intact pulses distally Dorsiflexion/Plantar flexion intact Incision: dressing C/D/I No cellulitis present Compartment soft} XR AP&Lat of hip shows well placed\fixed THA  Assessment:   3 Days Post-Op Procedure(s) (LRB): TOTAL HIP ARTHROPLASTY (Right) ADDITIONAL DIAGNOSIS:  Expected Acute Blood Loss Anemia,  Hx of Gastric bypass.  Plan: PT/OT WBAT, THA  posterior precautions  DVT Prophylaxis: SCDx72 hrs, ASA 325 mg BID x 2 weeks  DISCHARGE PLAN: Home once pt passes therapy goals.  DISCHARGE NEEDS: HHPT, HHRN, Walker and 3-in-1 comode seat

## 2014-10-25 NOTE — Progress Notes (Signed)
Occupational Therapy Treatment Patient Details Name: [name] MRN: [id number] DOB: 1966-08-03 Today's Date: 10/25/2014    History of present illness Pt is a 49 y.o. male s/p Rt THA (posterior). +orthostatic hypotension   OT comments  Pt moving well. Education provided. Pt planning to d/c home today.  Follow Up Recommendations  Home health OT    Equipment Recommendations  None recommended by OT    Recommendations for Other Services      Precautions / Restrictions Precautions Precautions: Posterior Hip Precaution Booklet Issued: No Precaution Comments: reviewed precautions Restrictions Weight Bearing Restrictions: Yes RLE Weight Bearing: Weight bearing as tolerated       Mobility Bed Mobility    General bed mobility comments: not assessed-verbalized he knew how to perform  Transfers Overall transfer level: Needs assistance Equipment used: Rolling walker (2 wheeled) Transfers: Sit to/from Stand to/from chair Sit to Stand: Supervision         General transfer comment: reinforced position of RLE.        ADL Overall ADL's : Needs assistance/impaired     Grooming: Anthony/dry hands;Oral care;Set up;Supervision/safety;Standing   Upper Body Bathing: Set up;Supervision/ safety;Standing   Lower Body Bathing: Set up;Supervison/ safety (standing)   Upper Body Dressing :  (OT assisted in untying gown for bath-feel he could perform)   Lower Body Dressing: Set up;Supervision/safety;Sitting/lateral leans;With adaptive equipment   Toilet Transfer: Supervision/safety;Ambulation;RW;Comfort height toilet   Toileting- Clothing Manipulation and Hygiene: Supervision/safety (standing)       Functional mobility during ADLs: Supervision/safety;Rolling walker General ADL Comments: Reviewed positioning of RLE for transfers. Educated on safety-safe shoewear, use of bag on walker, sitting for most of LB ADLs. Spoke briefly about alternative tub transfer of backing to chair and  swinging legs in, but pt plans to sponge bathe. Educated on LB dressing technique and educated on AE.      Vision                     Perception     Praxis      Cognition  Awake/Alert Behavior During Therapy: WFL for tasks assessed/performed Overall Cognitive Status: Within Functional Limits for tasks assessed                       Extremity/Trunk Assessment                  Shoulder Instructions       General Comments      Pertinent Vitals/ Pain       Pain Assessment: 0-10 Pain Score: 4  Pain Location: buttocks and right hip Pain Descriptors / Indicators: Aching Pain Intervention(s): Monitored during session;Repositioned  Home Living                                          Prior Functioning/Environment              Frequency Min 2X/week     Progress Toward Goals  OT Goals(current goals can now be found in the care plan section)  Progress towards OT goals: Progressing toward goals  Acute Rehab OT Goals Patient Stated Goal: not stated OT Goal Formulation: With patient Time For Goal Achievement: 10/29/14 Potential to Achieve Goals: Good ADL Goals Pt Will Transfer to Toilet: with min guard assist;ambulating;bedside commode;stand pivot transfer-met goal Pt Will Perform Toileting - Clothing Manipulation and hygiene:  with min guard assist;sit to/from stand Additional ADL Goal #1: pt will express understanding of and comfort with posterior THPs during ADLs/toilet transfers Additional ADL Goal #2: pt will use reacher with set up for LB adls  Plan Discharge plan remains appropriate    Co-evaluation                 End of Session Equipment Utilized During Treatment: Gait belt;Rolling walker   Activity Tolerance Patient tolerated treatment well   Patient Left in chair;with call bell/phone within reach   Nurse Communication          Time: 3833-3832 OT Time Calculation (min): 16 min  Charges: OT General  Charges $OT Visit: 1 Procedure OT Treatments $Self Care/Home Management : 8-22 mins  Benito Mccreedy OTR/L [phone] 10/25/2014, 9:42 AM

## 2014-10-25 NOTE — Progress Notes (Signed)
PATIENT ID: CATALDO COSGRIFF  MRN: [id number]  DOB/AGE:  1965/11/12 / 49 y.o.  4 Days Post-Op Procedure(s) (LRB): TOTAL HIP ARTHROPLASTY (Right)    PROGRESS NOTE Subjective: Patient is alert, oriented, no Nausea, no Vomiting, yes passing gas, no Bowel Movement. Taking PO well. Denies SOB, Chest or Calf Pain. Using Incentive Spirometer, PAS in place. Ambulate WBAT  Patient reports pain as 5 on 0-10 scale  .    Objective: Vital signs in last 24 hours: Filed Vitals:   10/24/14 2001 10/25/14 0000 10/25/14 0400 10/25/14 0553  BP: 102/61   101/55  Pulse: 81   73  Temp: 98.6 F (37 C)   98.3 F (36.8 C)  TempSrc: Oral   Oral  Resp: Height:      Weight:      SpO2: 100% 20% 100% 100%      Intake/Output from previous day: I/O last 3 completed shifts: In: 1320 [P.O.:1320] Out: 1300 [Urine:1300]   Intake/Output this shift:     LABORATORY DATA:  Recent Labs  10/23/14 0513 10/24/14 0311  WBC 8.3 8.1  HGB 8.8* 8.6*  HCT 27.1* 25.5*  PLT 153 145*    Examination: Neurologically intact Neurovascular intact Sensation intact distally Intact pulses distally Dorsiflexion/Plantar flexion intact Incision: dressing C/D/I No cellulitis present Compartment soft} XR AP&Lat of hip shows well placed\fixed THA  Assessment:   4 Days Post-Op Procedure(s) (LRB): TOTAL HIP ARTHROPLASTY (Right) ADDITIONAL DIAGNOSIS:  Expected Acute Blood Loss Anemia,  Hx of Gastric Bypass  Plan: PT/OT WBAT, THA  posterior precautions  DVT Prophylaxis: SCDx72 hrs, ASA 325 mg BID x 2 weeks  DISCHARGE PLAN: Home later today  DISCHARGE NEEDS: HHPT, HHRN, Walker and 3-in-1 comode seat

## 2014-10-25 NOTE — Progress Notes (Signed)
Physical Therapy Treatment Patient Details Name: [name] MRN: [id number] DOB: Aug 23, 1966 Today's Date: 10/25/2014    History of Present Illness Pt is a 49 y.o. male s/p Rt THA (posterior). +orthostatic hypotension    PT Comments    Pt progressing well with no LOB or lightheadedness this session. Reviewed HEP and car transfer technique. Pt safe from mobility standpoint to D/C home today.   Follow Up Recommendations  Home health PT;Supervision/Assistance - 24 hour     Equipment Recommendations  None recommended by PT    Recommendations for Other Services       Precautions / Restrictions Precautions Precautions: Posterior Hip Precaution Comments: reviewed precautions; pt able to independently recall 3/3  Restrictions Weight Bearing Restrictions: Yes RLE Weight Bearing: Weight bearing as tolerated    Mobility  Bed Mobility Overal bed mobility: Needs Assistance Bed Mobility: Sidelying to Sit   Sidelying to sit: Supervision       General bed mobility comments: bed flattened; reviewed use of sheet to (A) Rt LE   Transfers Overall transfer level: Needs assistance Equipment used: Rolling walker (2 wheeled) Transfers: Sit to/from Stand Sit to Stand: Supervision         General transfer comment: cues for hand placement   Ambulation/Gait Ambulation/Gait assistance: Supervision Ambulation Distance (Feet): 180 Feet (90' x 2) Assistive device: Rolling walker (2 wheeled) Gait Pattern/deviations: Step-through pattern;Decreased step length - left;Decreased stance time - right;Antalgic Gait velocity: decreased Gait velocity interpretation: Below normal speed for age/gender General Gait Details: initially ambulating with step to gt; with multimodal cues pt progressed to step through gt; required sitting rest break due to fatigue    Stairs            Wheelchair Mobility    Modified Rankin (Stroke Patients Only)       Balance Overall balance assessment:  Needs assistance Sitting-balance support: Feet supported;No upper extremity supported Sitting balance-Leahy Scale: Good Sitting balance - Comments: denied dizziness   Standing balance support: During functional activity;Bilateral upper extremity supported Standing balance-Leahy Scale: Poor Standing balance comment: RW to balance                     Cognition Arousal/Alertness: Awake/alert Behavior During Therapy: WFL for tasks assessed/performed Overall Cognitive Status: Within Functional Limits for tasks assessed                      Exercises Total Joint Exercises Ankle Circles/Pumps: AROM;Both;10 reps Quad Sets: AROM;Right;10 reps Gluteal Sets: 10 reps    General Comments General comments (skin integrity, edema, etc.): reviewed car transfer technique and HEP      Pertinent Vitals/Pain Pain Assessment: 0-10 Pain Score: 4  Pain Location: "buttocks" Pain Descriptors / Indicators:  (Stiff) Pain Intervention(s): Monitored during session;Repositioned;Premedicated before session;Ice applied    Home Living                      Prior Function            PT Goals (current goals can now be found in the care plan section) Acute Rehab PT Goals Patient Stated Goal: to go home today PT Goal Formulation: With patient Time For Goal Achievement: 10/29/14 Potential to Achieve Goals: Good Progress towards PT goals: Progressing toward goals    Frequency  7X/week    PT Plan Current plan remains appropriate    Co-evaluation             End of  Session Equipment Utilized During Treatment: Gait belt Activity Tolerance: Patient tolerated treatment well Patient left: in chair;with call bell/phone within reach     Time: 0748-0812 PT Time Calculation (min) (ACUTE ONLY): 24 min  Charges:  $Gait Training: 8-22 mins $Therapeutic Exercise: 8-22 mins                    G CodesDonell Sievert, [place]  [phone] 10/25/2014, 8:18 AM

## 2014-10-25 NOTE — Care Management Note (Addendum)
    Page 1 of 1   10/25/2014     11:42:02 AM CARE MANAGEMENT NOTE 10/25/2014  Patient:  CORBAN, KISTLER   Account Number:  [id number]  Date Initiated:  10/25/2014  Documentation initiated by:  [email or web address],YHCW  Subjective/Objective Assessment:   Right Total hip athoplasty     Action/Plan:   CM -Consult   Anticipated DC Date:  10/25/2014   Anticipated DC Plan:  [place]  CM consult      Choice offered to / List presented to:          [facility] arranged  HH-3 OT      Status of service:  Completed, signed off Medicare Important Message given?   (If response is "NO", the following Medicare IM given date fields will be blank) Date Medicare IM given:   Medicare IM given by:   Date Additional Medicare IM given:   Additional Medicare IM given by:    Discharge Disposition:    Per UR Regulation:    If discussed at Long Length of Stay Meetings, dates discussed:    Comments:        Jamse Arn RN BSN. 11.15 am Case Manager     CM met patient at bedside role of CM explained.Patient reports understanding.Patient reports he has [facility] at present and wishes to continue with  them for further home health needs.    CM called [facility] and gave new referal for OT and Home health aid services,Spoke with Ola Spurr Rep.    No further CM needs at this time.

## 2014-10-25 NOTE — Discharge Summary (Signed)
Patient ID: [name] MRN: [id number] DOB/AGE: 47/11/1965 47 y.o.  Admit date: 10/21/2014 Discharge date: 10/25/2014  Admission Diagnoses:  Principal Problem:   Osteoarthritis of right hip Active Problems:   Arthritis of right hip   Discharge Diagnoses:  Same  Past Medical History  Diagnosis Date  . Joint pain   . Back pain     compensating from hip pain  . History of colon polyps   . Osteoarthritis     "knees" 10/22/2014)    Surgeries: Procedure(s): TOTAL HIP ARTHROPLASTY on 10/21/2014   Consultants:    Discharged Condition: Improved  Hospital Course: [name] is an 49 y.o. male who was admitted 10/21/2014 for operative treatment ofOsteoarthritis of right hip. Patient has severe unremitting pain that affects sleep, daily activities, and work/hobbies. After pre-op clearance the patient was taken to the operating room on 10/21/2014 and underwent  Procedure(s): TOTAL HIP ARTHROPLASTY.    Patient was given perioperative antibiotics: Anti-infectives    Start     Dose/Rate Route Frequency Ordered Stop   10/21/14 0600  ceFAZolin (ANCEF) IVPB 2 g/50 mL premix     2 g100 mL/hr over 30 Minutes Intravenous On call to O.R. 10/20/14 1345 10/21/14 1246       Patient was given sequential compression devices, early ambulation, and chemoprophylaxis to prevent DVT.  Patient benefited maximally from hospital stay and there were no complications.    Recent vital signs: Patient Vitals for the past 24 hrs:  BP Temp Temp src Pulse Resp SpO2  10/25/14 0553 (!) 101/55 mmHg 98.3 F (36.8 C) Oral 73 20 100 %  10/25/14 0400 - - - - 16 100 %  10/25/14 0000 - - - - 18 (!) 20 %  10/24/14 2001 102/61 mmHg 98.6 F (37 C) Oral 81 20 100 %  10/24/14 2000 - - - - 18 100 %  10/24/14 1351 115/60 mmHg 98.7 F (37.1 C) Oral 88 20 -     Recent laboratory studies:  Recent Labs  10/23/14 0513 10/24/14 0311  WBC 8.3 8.1  HGB 8.8* 8.6*  HCT 27.1* 25.5*  PLT 153 145*      Discharge Medications:     Medication List    TAKE these medications        aspirin EC 325 MG tablet  Take 1 tablet (325 mg total) by mouth 2 (two) times daily.     ferrous sulfate 325 (65 FE) MG tablet  Take 1 tablet (325 mg total) by mouth daily with breakfast.     ibuprofen 800 MG tablet  Commonly known as:  ADVIL,MOTRIN  Take 800 mg by mouth every 8 (eight) hours as needed for moderate pain.     methocarbamol 500 MG tablet  Commonly known as:  ROBAXIN  Take 1 tablet (500 mg total) by mouth 2 (two) times daily with a meal.     multivitamin with minerals Tabs tablet  Take 1 tablet by mouth daily.     oxyCODONE-acetaminophen 5-325 MG per tablet  Commonly known as:  ROXICET  Take 1 tablet by mouth every 4 (four) hours as needed.     VISINE EXTRA 0.05 % ophthalmic solution  Generic drug:  tetrahydrozoline  Place 2 drops into both eyes daily as needed (for eyes).        Diagnostic Studies: Dg Pelvis Portable  10/21/2014   CLINICAL DATA:  Status post right hip arthroplasty.  EXAM: PORTABLE PELVIS 1-2 VIEWS  COMPARISON:  CT abdomen and pelvis 07/27/2012  FINDINGS: Sequelae of interval right total hip arthroplasty are identified. The prosthetic femoral and acetabular components are normally located on this single AP projection. No periprosthetic fracture is identified. Postoperative soft tissue emphysema is noted about the right hip.  IMPRESSION: Right total hip arthroplasty without radiographic evidence of immediate complication.   Electronically Signed   By: Sebastian Ache   On: 10/21/2014 15:20    Disposition: 01-Home or Self Care      Discharge Instructions    Call MD / Call 911    Complete by:  As directed   If you experience chest pain or shortness of breath, CALL 911 and be transported to the hospital emergency room.  If you develope a fever above 101 F, pus (white drainage) or increased drainage or redness at the wound, or calf pain, call your surgeon's office.      Change dressing    Complete by:  As directed   You may change your dressing on day 5, then change the dressing daily with sterile 4 x 4 inch gauze dressing and paper tape.  You may clean the incision with alcohol prior to redressing     Constipation Prevention    Complete by:  As directed   Drink plenty of fluids.  Prune juice may be helpful.  You may use a stool softener, such as Colace (over the counter) 100 mg twice a day.  Use MiraLax (over the counter) for constipation as needed.     Diet - low sodium heart healthy    Complete by:  As directed      Discharge instructions    Complete by:  As directed   Follow up in office with Dr. Turner Daniels in 2 weeks.     Driving restrictions    Complete by:  As directed   No driving for 2 weeks     Follow the hip precautions as taught in Physical Therapy    Complete by:  As directed      Increase activity slowly as tolerated    Complete by:  As directed      Patient may shower    Complete by:  As directed   You may shower without a dressing once there is no drainage.  Do not wash over the wound.  If drainage remains, cover wound with plastic wrap and then shower.           Follow-up Information    Follow up with Nestor Lewandowsky, MD In 2 weeks.   Specialty:  Orthopedic Surgery   Contact information:   [address] [place] [address] [phone]       Follow up with [facility].   Why:  They will call you to schedule home physical therapy visits.   Contact information:   [address] [place] [address] [phone]        Signed: Vear Clock, Beronica Lansdale R 10/25/2014, 8:22 AM

## 2014-11-05 ENCOUNTER — Ambulatory Visit (HOSPITAL_COMMUNITY)
Admission: RE | Admit: 2014-11-05 | Discharge: 2014-11-05 | Disposition: A | Discharge status: Home / Self Care | Payer: BLUE CROSS/BLUE SHIELD | Source: Ambulatory Visit | Attending: Orthopedic Surgery | Admitting: Orthopedic Surgery

## 2014-11-05 ENCOUNTER — Other Ambulatory Visit (HOSPITAL_COMMUNITY): Payer: Self-pay | Admitting: Orthopedic Surgery

## 2014-11-05 ENCOUNTER — Encounter (HOSPITAL_COMMUNITY): Payer: Self-pay | Admitting: *Deleted

## 2014-11-05 ENCOUNTER — Emergency Department (HOSPITAL_COMMUNITY)
Admission: EM | Admit: 2014-11-05 | Discharge: 2014-11-05 | Disposition: A | Discharge status: Home / Self Care | Payer: BLUE CROSS/BLUE SHIELD | Attending: Emergency Medicine | Admitting: Emergency Medicine

## 2014-11-05 DIAGNOSIS — Z96641 Presence of right artificial hip joint: Secondary | ICD-10-CM | POA: Insufficient documentation

## 2014-11-05 DIAGNOSIS — M25561 Pain in right knee: Secondary | ICD-10-CM

## 2014-11-05 DIAGNOSIS — Z7982 Long term (current) use of aspirin: Secondary | ICD-10-CM | POA: Insufficient documentation

## 2014-11-05 DIAGNOSIS — M7989 Other specified soft tissue disorders: Secondary | ICD-10-CM | POA: Diagnosis present

## 2014-11-05 DIAGNOSIS — I82401 Acute embolism and thrombosis of unspecified deep veins of right lower extremity: Secondary | ICD-10-CM | POA: Insufficient documentation

## 2014-11-05 DIAGNOSIS — M79661 Pain in right lower leg: Secondary | ICD-10-CM | POA: Diagnosis present

## 2014-11-05 DIAGNOSIS — Z86010 Personal history of colonic polyps: Secondary | ICD-10-CM | POA: Diagnosis not present

## 2014-11-05 DIAGNOSIS — M1990 Unspecified osteoarthritis, unspecified site: Secondary | ICD-10-CM | POA: Insufficient documentation

## 2014-11-05 DIAGNOSIS — R609 Edema, unspecified: Secondary | ICD-10-CM

## 2014-11-05 DIAGNOSIS — Z79899 Other long term (current) drug therapy: Secondary | ICD-10-CM | POA: Diagnosis not present

## 2014-11-05 DIAGNOSIS — Z7901 Long term (current) use of anticoagulants: Secondary | ICD-10-CM | POA: Insufficient documentation

## 2014-11-05 DIAGNOSIS — Z8781 Personal history of (healed) traumatic fracture: Secondary | ICD-10-CM | POA: Insufficient documentation

## 2014-11-05 MED ORDER — XARELTO VTE STARTER PACK 15 & 20 MG PO TBPK: 15.0000 mg | ORAL_TABLET | ORAL | Status: DC

## 2014-11-05 NOTE — Discharge Instructions (Signed)

## 2014-11-05 NOTE — Progress Notes (Signed)
*  Preliminary Results* Right lower extremity venous duplex completed. Right lower extremity is positive for deep vein thrombosis involving a single right peroneal vein. There is no evidence of right Baker's cyst.  Preliminary results discussed with Dannielle BurnEric Phillips, PA-C.  11/05/2014 4:54 PM  [name], RVT, RDCS, RDMS

## 2014-11-05 NOTE — ED Notes (Signed)
Pt in stating that he has been experiencing pain to his right calf, seen at PCP and sent for a doppler study which was positive for DVT, sent here for blood thinners

## 2014-11-05 NOTE — ED Provider Notes (Signed)
CSN: [id number]     Arrival date & time 11/05/14  1704 History   First MD Initiated Contact with Patient 11/05/14 1753     Chief Complaint  Patient presents with  . DVT     (Consider location/radiation/quality/duration/timing/severity/associated sxs/prior Treatment) HPI Comments: The patient is a 49 year old male who underwent right total hip replacement approximately 2 weeks ago, he presents with right calf pain, he denies chest pain shortness of breath and states that the Pain has been gradually worsening and became severe last night. He was sent for an ultrasound this morning, the Doppler test read positive for DVT in the peroneal vein of the right leg. He was sent to the emergency department for treatment. The patient has no history of DVT, PE or other thromboembolism, he has no history of risk factors for this except for recent surgery.  The history is provided by the patient.    Past Medical History  Diagnosis Date  . Joint pain   . Back pain     compensating from hip pain  . History of colon polyps   . Osteoarthritis     "knees" 10/22/2014)   Past Surgical History  Procedure Laterality Date  . Breath tek h pylori  02/14/2012    Procedure: BREATH TEK H PYLORI;  Surgeon: Kandis Cocking, MD;  Location: [facility];  Service: General;  Laterality: N/A;  . Patella fracture surgery Left 1985    pin placed  . Wrist fracture surgery Left ~ 1975  . Gastric roux-en-y  06/17/2012    Procedure: LAPAROSCOPIC ROUX-EN-Y GASTRIC;  Surgeon: Kandis Cocking, MD;  Location: [facility];  Service: General;  Laterality: N/A;  . Total knee arthroplasty Left 09/12/2013    Procedure: TOTAL KNEE ARTHROPLASTY;  Surgeon: Nestor Lewandowsky, MD;  Location: [facility];  Service: Orthopedics;  Laterality: Left;  . Tonsillectomy and adenoidectomy  ~ 1973  . Knee hardware removal Left 1986    "took pin out"  . Colonoscopy    . Esophagogastroduodenoscopy    . Total hip arthroplasty Right 10/21/2014    Procedure: TOTAL  HIP ARTHROPLASTY;  Surgeon: Nestor Lewandowsky, MD;  Location: [facility];  Service: Orthopedics;  Laterality: Right;  . Fracture surgery    . Joint replacement     Family History  Problem Relation Age of Onset  . Diabetes Mother 36  . Lung cancer Father 66  . Hypertension Father   . Alcohol abuse Father   . Stomach cancer Mother 110  . Hypertension Mother    History  Substance Use Topics  . Smoking status: Never Smoker   . Smokeless tobacco: Never Used  . Alcohol Use: No    Review of Systems  All other systems reviewed and are negative.     Allergies  Review of patient's allergies indicates no known allergies.  Home Medications   Prior to Admission medications   Medication Sig Start Date End Date Taking? Authorizing Provider  aspirin EC 325 MG tablet Take 1 tablet (325 mg total) by mouth 2 (two) times daily. 10/21/14   Allena Katz, PA-C  ferrous sulfate 325 (65 FE) MG tablet Take 1 tablet (325 mg total) by mouth daily with breakfast. 09/17/13   Newt Lukes, MD  ibuprofen (ADVIL,MOTRIN) 800 MG tablet Take 800 mg by mouth every 8 (eight) hours as needed for moderate pain.  09/16/13   Historical Provider, MD  methocarbamol (ROBAXIN) 500 MG tablet Take 1 tablet (500 mg total) by mouth 2 (two)  times daily with a meal. 10/21/14   Allena KatzEric K Phillips, PA-C  Multiple Vitamin (MULTIVITAMIN WITH MINERALS) TABS tablet Take 1 tablet by mouth daily.    Historical Provider, MD  oxyCODONE-acetaminophen (ROXICET) 5-325 MG per tablet Take 1 tablet by mouth every 4 (four) hours as needed. 10/21/14   Allena KatzEric K Phillips, PA-C  tetrahydrozoline (VISINE EXTRA) 0.05 % ophthalmic solution Place 2 drops into both eyes daily as needed (for eyes).    Historical Provider, MD  XARELTO STARTER PACK 15 & 20 MG TBPK Take 15-20 mg by mouth as directed. Take as directed on package: Start with one 15mg  tablet by mouth twice a day with food. On Day 22, switch to one 20mg  tablet once a day with food. 11/05/14   Vida RollerBrian D  Nefertiti Mohamad, MD   BP 105/62 mmHg  Pulse 66  Temp(Src) 98.3 F (36.8 C)  Resp 18  SpO2 100% Physical Exam  Constitutional: He appears well-developed and well-nourished. No distress.  HENT:  Head: Normocephalic and atraumatic.  Mouth/Throat: Oropharynx is clear and moist. No oropharyngeal exudate.  Eyes: Conjunctivae and EOM are normal. Pupils are equal, round, and reactive to light. Right eye exhibits no discharge. Left eye exhibits no discharge. No scleral icterus.  Neck: Normal range of motion. Neck supple. No JVD present. No thyromegaly present.  Cardiovascular: Normal rate, regular rhythm, normal heart sounds and intact distal pulses.  Exam reveals no gallop and no friction rub.   No murmur heard. Pulmonary/Chest: Effort normal and breath sounds normal. No respiratory distress. He has no wheezes. He has no rales.  Abdominal: Soft. Bowel sounds are normal. He exhibits no distension and no mass. There is no tenderness.  Musculoskeletal: Normal range of motion. He exhibits no edema or tenderness.  Lymphadenopathy:    He has no cervical adenopathy.  Neurological: He is alert. Coordination normal.  Skin: Skin is warm and dry. No rash noted. No erythema.  Psychiatric: He has a normal mood and affect. His behavior is normal.  Nursing note and vitals reviewed.   ED Course  Procedures (including critical care time) Labs Review Labs Reviewed - No data to display  Imaging Review No results found.    MDM   Final diagnoses:  DVT (deep venous thrombosis), right    The patient has no asymmetry of his legs, no edema, no tenderness in the calf. His ultrasound was reviewed showing DVT, due to recent surgery it would be prudent to start him on anticoagulation despite this being inferior to the popliteal fossa. Discussed the risks benefits and alternatives of anticoagulation, the patient has chosen Xarelto as the medication of his choice, I have reviewed in detail the indications for return  including bleeding and that this is a potential and not infrequent complication. Again he agrees to this and wishes to follow-up with his family doctor for a recheck after starting the medications. He will be discharged, he is stable, no signs of pulmonary embolism.  Meds given in ED:  Medications - No data to display  New Prescriptions   XARELTO STARTER PACK 15 & 20 MG TBPK    Take 15-20 mg by mouth as directed. Take as directed on package: Start with one 15mg  tablet by mouth twice a day with food. On Day 22, switch to one 20mg  tablet once a day with food.        Vida RollerBrian D Reilly Molchan, MD 11/05/14 [phone]

## 2014-11-13 ENCOUNTER — Ambulatory Visit: Payer: BLUE CROSS/BLUE SHIELD | Admitting: Internal Medicine

## 2014-11-26 ENCOUNTER — Encounter: Payer: Self-pay | Admitting: Internal Medicine

## 2014-11-26 ENCOUNTER — Ambulatory Visit (INDEPENDENT_AMBULATORY_CARE_PROVIDER_SITE_OTHER): Payer: BLUE CROSS/BLUE SHIELD | Admitting: Internal Medicine

## 2014-11-26 VITALS — BP 112/70 | HR 61 | Temp 98.2°F | Ht 69.0 in | Wt 207.0 lb

## 2014-11-26 DIAGNOSIS — R739 Hyperglycemia, unspecified: Secondary | ICD-10-CM

## 2014-11-26 DIAGNOSIS — I82459 Acute embolism and thrombosis of unspecified peroneal vein: Secondary | ICD-10-CM | POA: Insufficient documentation

## 2014-11-26 DIAGNOSIS — R609 Edema, unspecified: Secondary | ICD-10-CM

## 2014-11-26 DIAGNOSIS — I82451 Acute embolism and thrombosis of right peroneal vein: Secondary | ICD-10-CM

## 2014-11-26 DIAGNOSIS — D62 Acute posthemorrhagic anemia: Secondary | ICD-10-CM

## 2014-11-26 DIAGNOSIS — I82491 Acute embolism and thrombosis of other specified deep vein of right lower extremity: Secondary | ICD-10-CM

## 2014-11-26 MED ORDER — RIVAROXABAN 20 MG PO TABS: 20.0000 mg | ORAL_TABLET | Freq: Every day | ORAL | Status: DC

## 2014-11-26 NOTE — Progress Notes (Signed)
Pre visit review using our clinic review tool, if applicable. No additional management support is needed unless otherwise documented below in the visit note. 

## 2014-11-26 NOTE — Patient Instructions (Signed)
Avoid ingestion of  excess salt/sodium.Cook with pepper & other spices . Use the salt substitute "No Salt" OR the Mrs Sharilyn SitesDash products to season food @ the table. Avoid foods which taste salty or "vinegary" as their sodium content will be high.  Please come in for fasting labs to help assess the cause of the swelling.  Switch to the 20 mg Xarelto when the 15 mg twice a day supply is completed. The tentative plan is to continue the blood thinner by mouth for a total of 3 months and then reassess need for anticoagulation.

## 2014-11-26 NOTE — Progress Notes (Signed)
   Subjective:    Patient ID: Anthony SpiesMaurice C Rhodes, male    DOB: May 18, 1966, 49 y.o.   MRN: [id number]  HPI  He is here to follow-up the right peroneal deep venous thrombosis in January this year following hip replacement 10/21/14.[name]. He was placed on Xarelto oral anticoagulant and now returns for follow-up.  The labs were reviewed. He had postoperative anemia with hemoglobin 8.6 & hematocrit 25.5 on 10/24/14. In late December his kidney function was normal. Glucose was 126. He's had no liver function tests, albumin, or thyroid function measured since 2014 He had a total knee replacement 2014 with no post op complications.  He also had bariatric surgery 2013. He has lost from 380 down to 207, 173 pound loss.  He denies any cardiopulmonary symptoms at this time except chronic edema. He does not restrict sodium..  Review of Systems   Chest pain, palpitations, tachycardia, exertional dyspnea, paroxysmal nocturnal dyspnea, or claudication  are absent.      Objective:   Physical Exam  Positive or pertinent findings include: He has a slow heart rhythm which is regular. Abdominal tissue is redundant; striae are present. He has 2+ edema of the lower extremities. Dorsalis pedis pulses are decreased.  General appearance :adequately nourished; in no distress. Eyes: No conjunctival inflammation or scleral icterus is present. Heart:  S1 and S2 normal without gallop, murmur, click, rub or other extra sounds   Lungs:Chest clear to auscultation; no wheezes, rhonchi,rales ,or rubs present.No increased work of breathing.  Abdomen: bowel sounds normal, soft and non-tender without masses, organomegaly or hernias noted.  No guarding or rebound.  Vascular : all pulses equal ; no bruits present. Skin:Warm & dry.  Intact without suspicious lesions or rashes ; no tenting Lymphatic: No lymphadenopathy is noted about the head, neck, axilla areas.  Neuro: Strength, tone & DTRs normal..          Assessment &  Plan:  #1 deep venous thrombosis; on active oral anticoagulant therapy.Problematic is it's cost of over $600 a month.   #2 edema, probably multifactorial. He does not restrict sodium.  #3 postoperative anemia.  Plan see orders and recommendations

## 2014-11-27 ENCOUNTER — Other Ambulatory Visit (INDEPENDENT_AMBULATORY_CARE_PROVIDER_SITE_OTHER): Payer: BLUE CROSS/BLUE SHIELD

## 2014-11-27 DIAGNOSIS — D62 Acute posthemorrhagic anemia: Secondary | ICD-10-CM

## 2014-11-27 DIAGNOSIS — R609 Edema, unspecified: Secondary | ICD-10-CM

## 2014-11-27 DIAGNOSIS — R739 Hyperglycemia, unspecified: Secondary | ICD-10-CM

## 2014-11-27 LAB — CBC WITH DIFFERENTIAL/PLATELET
BASOS ABS: 0 10*3/uL (ref 0.0–0.1)
BASOS PCT: 0.5 % (ref 0.0–3.0)
EOS PCT: 1.2 % (ref 0.0–5.0)
Eosinophils Absolute: 0.1 10*3/uL (ref 0.0–0.7)
HCT: 33.8 % — ABNORMAL LOW (ref 39.0–52.0)
Hemoglobin: 11.3 g/dL — ABNORMAL LOW (ref 13.0–17.0)
LYMPHS PCT: 32.3 % (ref 12.0–46.0)
Lymphs Abs: 1.4 10*3/uL (ref 0.7–4.0)
MCHC: 33.4 g/dL (ref 30.0–36.0)
MCV: 86.8 fl (ref 78.0–100.0)
Monocytes Absolute: 0.3 10*3/uL (ref 0.1–1.0)
Monocytes Relative: 7.7 % (ref 3.0–12.0)
NEUTROS ABS: 2.5 10*3/uL (ref 1.4–7.7)
Neutrophils Relative %: 58.3 % (ref 43.0–77.0)
Platelets: 195 10*3/uL (ref 150.0–400.0)
RBC: 3.9 Mil/uL — ABNORMAL LOW (ref 4.22–5.81)
RDW: 14.2 % (ref 11.5–15.5)
WBC: 4.3 10*3/uL (ref 4.0–10.5)

## 2014-11-27 LAB — HEPATIC FUNCTION PANEL
ALBUMIN: 3.8 g/dL (ref 3.5–5.2)
ALK PHOS: 112 U/L (ref 39–117)
ALT: 13 U/L (ref 0–53)
AST: 15 U/L (ref 0–37)
BILIRUBIN DIRECT: 0.1 mg/dL (ref 0.0–0.3)
BILIRUBIN TOTAL: 0.6 mg/dL (ref 0.2–1.2)
TOTAL PROTEIN: 6.6 g/dL (ref 6.0–8.3)

## 2014-11-27 LAB — BASIC METABOLIC PANEL
BUN: 16 mg/dL (ref 6–23)
CHLORIDE: 105 meq/L (ref 96–112)
CO2: 30 mEq/L (ref 19–32)
Calcium: 9.3 mg/dL (ref 8.4–10.5)
Creatinine, Ser: 0.94 mg/dL (ref 0.40–1.50)
GFR: 110.77 mL/min (ref >60.00)
Glucose, Bld: 81 mg/dL (ref 70–99)
POTASSIUM: 4.1 meq/L (ref 3.5–5.1)
SODIUM: 139 meq/L (ref 135–145)

## 2014-11-27 LAB — TSH: TSH: 2.9 u[IU]/mL (ref 0.35–4.50)

## 2014-11-27 LAB — IBC PANEL
Iron: 55 ug/dL (ref 42–165)
Saturation Ratios: 19.4 % — ABNORMAL LOW (ref 20.0–50.0)
Transferrin: 203 mg/dL — ABNORMAL LOW (ref 212.0–360.0)

## 2014-11-27 LAB — HEMOGLOBIN A1C: Hgb A1c MFr Bld: 4.5 % — ABNORMAL LOW (ref 4.6–6.5)

## 2015-11-22 ENCOUNTER — Telehealth: Payer: Self-pay | Admitting: Internal Medicine

## 2015-11-22 NOTE — Telephone Encounter (Signed)
Patient has establishment appt with dr burns on 01/12/2016. Needs rivaroxaban (XARELTO) 20 MG TABS tablet [[id number]] sent to [facility] on [address] to last until the appt. Thanks so much

## 2015-11-23 ENCOUNTER — Telehealth: Payer: Self-pay

## 2015-11-23 ENCOUNTER — Other Ambulatory Visit: Payer: Self-pay

## 2015-11-23 DIAGNOSIS — I82451 Acute embolism and thrombosis of right peroneal vein: Secondary | ICD-10-CM

## 2015-11-23 MED ORDER — RIVAROXABAN 20 MG PO TABS: 20.0000 mg | ORAL_TABLET | Freq: Every day | ORAL | Status: DC

## 2015-11-23 NOTE — Telephone Encounter (Signed)
PA initiated via [employer]. PA approved 10/24/2015 - 11/22/2016 ID [id number]

## 2016-01-12 ENCOUNTER — Ambulatory Visit: Payer: BLUE CROSS/BLUE SHIELD | Admitting: Internal Medicine

## 2016-03-07 ENCOUNTER — Ambulatory Visit: Payer: BLUE CROSS/BLUE SHIELD | Admitting: Internal Medicine

## 2016-03-07 DIAGNOSIS — Z0289 Encounter for other administrative examinations: Secondary | ICD-10-CM

## 2016-11-02 ENCOUNTER — Encounter (HOSPITAL_COMMUNITY): Payer: Self-pay

## 2017-10-31 ENCOUNTER — Emergency Department (HOSPITAL_BASED_OUTPATIENT_CLINIC_OR_DEPARTMENT_OTHER)
Admission: EM | Admit: 2017-10-31 | Discharge: 2017-10-31 | Disposition: A | Discharge status: Home / Self Care | Payer: BLUE CROSS/BLUE SHIELD | Attending: Emergency Medicine | Admitting: Emergency Medicine

## 2017-10-31 ENCOUNTER — Encounter (HOSPITAL_COMMUNITY): Payer: Self-pay | Admitting: Emergency Medicine

## 2017-10-31 ENCOUNTER — Encounter (HOSPITAL_BASED_OUTPATIENT_CLINIC_OR_DEPARTMENT_OTHER): Payer: Self-pay

## 2017-10-31 ENCOUNTER — Emergency Department (HOSPITAL_COMMUNITY)
Admission: EM | Admit: 2017-10-31 | Discharge: 2017-10-31 | Discharge status: Against Medical Advice | Payer: BLUE CROSS/BLUE SHIELD | Attending: Emergency Medicine | Admitting: Emergency Medicine

## 2017-10-31 ENCOUNTER — Other Ambulatory Visit: Payer: Self-pay

## 2017-10-31 DIAGNOSIS — S0993XA Unspecified injury of face, initial encounter: Secondary | ICD-10-CM | POA: Diagnosis present

## 2017-10-31 DIAGNOSIS — Z96651 Presence of right artificial knee joint: Secondary | ICD-10-CM | POA: Diagnosis not present

## 2017-10-31 DIAGNOSIS — Y92410 Unspecified street and highway as the place of occurrence of the external cause: Secondary | ICD-10-CM | POA: Diagnosis not present

## 2017-10-31 DIAGNOSIS — Z5321 Procedure and treatment not carried out due to patient leaving prior to being seen by health care provider: Secondary | ICD-10-CM | POA: Diagnosis not present

## 2017-10-31 DIAGNOSIS — S01111A Laceration without foreign body of right eyelid and periocular area, initial encounter: Secondary | ICD-10-CM | POA: Diagnosis not present

## 2017-10-31 DIAGNOSIS — Y939 Activity, unspecified: Secondary | ICD-10-CM | POA: Diagnosis not present

## 2017-10-31 DIAGNOSIS — Y999 Unspecified external cause status: Secondary | ICD-10-CM | POA: Insufficient documentation

## 2017-10-31 DIAGNOSIS — W2210XA Striking against or struck by unspecified automobile airbag, initial encounter: Secondary | ICD-10-CM | POA: Insufficient documentation

## 2017-10-31 DIAGNOSIS — Y9389 Activity, other specified: Secondary | ICD-10-CM | POA: Diagnosis not present

## 2017-10-31 DIAGNOSIS — Y998 Other external cause status: Secondary | ICD-10-CM | POA: Insufficient documentation

## 2017-10-31 DIAGNOSIS — Z96652 Presence of left artificial knee joint: Secondary | ICD-10-CM | POA: Diagnosis not present

## 2017-10-31 DIAGNOSIS — S0181XA Laceration without foreign body of other part of head, initial encounter: Secondary | ICD-10-CM | POA: Diagnosis present

## 2017-10-31 MED ORDER — LIDOCAINE HCL 2 % IJ SOLN
INTRAMUSCULAR | Status: AC
  Filled 2017-10-31: qty 20
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
## 2017-10-31 NOTE — ED Triage Notes (Signed)
Pt family stated that they did not want to wait. Pt desired to leave without being seen.
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
## 2017-10-31 NOTE — Discharge Instructions (Signed)
Local wound care with bacitracin and dressing changes twice daily.  Sutures are to be removed in 5-7 days.  Please follow-up with your primary doctor for this.  Return to the emergency department if you develop severe headache, pus draining from the wound, increased pain, or other new and concerning symptoms.

## 2017-10-31 NOTE — ED Notes (Signed)
ED Provider at bedside. 
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
## 2017-10-31 NOTE — ED Notes (Signed)
-  t understood dc material. NAD noted.

## 2018-11-14 ENCOUNTER — Encounter: Payer: Self-pay | Admitting: Gastroenterology

## 2018-11-18 ENCOUNTER — Ambulatory Visit: Payer: Managed Care, Other (non HMO)

## 2018-11-18 ENCOUNTER — Encounter: Payer: Self-pay | Admitting: Gastroenterology

## 2018-11-18 VITALS — Ht 69.5 in | Wt 257.4 lb

## 2018-11-18 DIAGNOSIS — Z1211 Encounter for screening for malignant neoplasm of colon: Secondary | ICD-10-CM

## 2018-11-18 MED ORDER — NA SULFATE-K SULFATE-MG SULF 17.5-3.13-1.6 GM/177ML PO SOLN: 1.0000 | Freq: Once | ORAL | 0 refills | Status: AC

## 2018-11-18 NOTE — Progress Notes (Signed)
Denies allergies to eggs or soy products. Denies complication of anesthesia or sedation. Denies use of weight loss medication. Denies use of O2.   Emmi instructions declined.    A pay no more than 50.00 coupon was given to the patient.  

## 2018-12-02 ENCOUNTER — Encounter: Payer: BLUE CROSS/BLUE SHIELD | Admitting: Gastroenterology

## 2019-01-07 ENCOUNTER — Encounter: Payer: Self-pay | Admitting: Gastroenterology

## 2021-09-29 ENCOUNTER — Encounter: Payer: Self-pay | Admitting: Cardiology

## 2021-09-29 ENCOUNTER — Ambulatory Visit: Payer: BC Managed Care – PPO | Admitting: Cardiology

## 2021-09-29 ENCOUNTER — Other Ambulatory Visit: Payer: Self-pay

## 2021-09-29 VITALS — BP 119/71 | HR 93 | Temp 98.7°F | Resp 14 | Ht 69.5 in | Wt 238.6 lb

## 2021-09-29 DIAGNOSIS — Z01810 Encounter for preprocedural cardiovascular examination: Secondary | ICD-10-CM

## 2021-09-29 DIAGNOSIS — Z9884 Bariatric surgery status: Secondary | ICD-10-CM

## 2021-09-29 DIAGNOSIS — R011 Cardiac murmur, unspecified: Secondary | ICD-10-CM

## 2021-09-29 NOTE — Progress Notes (Signed)
Primary Physician/Referring:  [facility]  Patient ID: [name], male    DOB: 09/07/1966, 55 y.o.   MRN: [id number]  Chief Complaint  Patient presents with   Systolic murmur   New Patient (Initial Visit)    Referred by Lindaann Pascal, PA   HPI:    [name]  is a 55 y.o. African-American male patient with morbid obesity, history of gastric bypass surgery on 06/19/2012 and has lost significant amount of weight, has had DVT in the past postprocedure, now referred to me for evaluation of heart murmur and preop cardiac restratification.  Since gastric bypass and weight loss, hypertension and diabetes mellitus has resolved.  He is scheduled for right knee arthroplasty and replacement soon by Dr. Gean Birchwood, during summer months and on good weather days, is able to ride his bike at least 3 to 5 miles without any discomfort, he also is able to walk up 3 flights of stairs on a daily basis at work without any dyspnea or shortness of breath.  Past Medical History:  Diagnosis Date   Back pain    compensating from hip pain   Clotting disorder [facility])    History of colon polyps    Joint pain    Osteoarthritis    "knees" 10/22/2014)   Past Surgical History:  Procedure Laterality Date   BREATH TEK H PYLORI  02/14/2012   Procedure: BREATH TEK H PYLORI;  Surgeon: Kandis Cocking, MD;  Location: [facility];  Service: General;  Laterality: N/A;   COLONOSCOPY     ESOPHAGOGASTRODUODENOSCOPY     FRACTURE SURGERY     GASTRIC ROUX-EN-Y  06/17/2012   Procedure: LAPAROSCOPIC ROUX-EN-Y GASTRIC;  Surgeon: Kandis Cocking, MD;  Location: [facility];  Service: General;  Laterality: N/A;   JOINT REPLACEMENT     KNEE HARDWARE REMOVAL Left 1986   "took pin out"   PATELLA FRACTURE SURGERY Left 1985   pin placed   TONSILLECTOMY AND ADENOIDECTOMY  ~ 1973   TOTAL HIP ARTHROPLASTY Right 10/21/2014   Procedure: TOTAL HIP ARTHROPLASTY;  Surgeon: Nestor Lewandowsky, MD;  Location: [facility];  Service:  Orthopedics;  Laterality: Right;   TOTAL KNEE ARTHROPLASTY Left 09/12/2013   Procedure: TOTAL KNEE ARTHROPLASTY;  Surgeon: Nestor Lewandowsky, MD;  Location: [facility];  Service: Orthopedics;  Laterality: Left;   WRIST FRACTURE SURGERY Left ~ 1975   Family History  Problem Relation Age of Onset   Diabetes Mother 16   Stomach cancer Mother 39   Hypertension Mother    Colon cancer Mother    Lung cancer Father 51   Hypertension Father    Alcohol abuse Father    Esophageal cancer Neg Hx    Rectal cancer Neg Hx     Social History   Tobacco Use   Smoking status: Never   Smokeless tobacco: Never  Substance Use Topics   Alcohol use: No   Marital Status: Married  ROS  Review of Systems  Cardiovascular:  Negative for chest pain, dyspnea on exertion and leg swelling.  Musculoskeletal:  Positive for arthritis (left knee).  Gastrointestinal:  Negative for melena.  Objective  Blood pressure 119/71, pulse 93, temperature 98.7 F (37.1 C), temperature source Temporal, resp. rate 14, height 5' 9.5" (1.765 m), weight 238 lb 9.6 oz (108.2 kg), SpO2 98 %. Body mass index is 34.73 kg/m.  Vitals with BMI 09/29/2021 11/18/2018 10/31/2017  Height 5' 9.5" 5' 9.5" -  Weight 238 lbs  10 oz 257 lbs 6 oz -  BMI [id number] 0000000 -  Systolic 123456 - XX123456  Diastolic 71 - 78  Pulse 93 - 64    Physical Exam Neck:     Vascular: No carotid bruit or JVD.  Cardiovascular:     Rate and Rhythm: Normal rate and regular rhythm.     Pulses: Intact distal pulses.     Heart sounds: Normal heart sounds. No murmur heard.   No gallop.  Pulmonary:     Effort: Pulmonary effort is normal.     Breath sounds: Normal breath sounds.  Abdominal:     General: Bowel sounds are normal.     Palpations: Abdomen is soft.  Musculoskeletal:        General: No swelling.     Laboratory examination:   No results for input(s): NA, K, CL, CO2, GLUCOSE, BUN, CREATININE, CALCIUM, GFRNONAA, GFRAA in the last 8760 hours. CrCl cannot be calculated  (Patient's most recent lab result is older than the maximum 21 days allowed.).  CMP Latest Ref Rng & Units 11/27/2014 10/22/2014 10/15/2014  Glucose 70 - 99 mg/dL 81 126(H) 102(H)  BUN 6 - 23 mg/dL 16 10 18   Creatinine 0.40 - 1.50 mg/dL 0.94 0.78 1.00  Sodium 135 - 145 mEq/L 139 136 137  Potassium 3.5 - 5.1 mEq/L 4.1 4.4 3.7  Chloride 96 - 112 mEq/L 105 104 104  CO2 19 - 32 mEq/L 30 28 26   Calcium 8.4 - 10.5 mg/dL 9.3 8.6 9.0  Total Protein 6.0 - 8.3 g/dL 6.6 - -  Total Bilirubin 0.2 - 1.2 mg/dL 0.6 - -  Alkaline Phos 39 - 117 U/L 112 - -  AST 0 - 37 U/L 15 - -  ALT 0 - 53 U/L 13 - -   CBC Latest Ref Rng & Units 11/27/2014 10/24/2014 10/23/2014  WBC 4.0 - 10.5 K/uL 4.3 8.1 8.3  Hemoglobin 13.0 - 17.0 g/dL 11.3(L) 8.6(L) 8.8(L)  Hematocrit 39.0 - 52.0 % 33.8(L) 25.5(L) 27.1(L)  Platelets 150.0 - 400.0 K/uL 195.0 145(L) 153    Lipid Panel No results for input(s): CHOL, TRIG, LDLCALC, VLDL, HDL, CHOLHDL, LDLDIRECT in the last 8760 hours. Lipid Panel     Component Value Date/Time   CHOL 109 04/22/2013 0922   TRIG 36.0 04/22/2013 0922   HDL 45.70 04/22/2013 0922   CHOLHDL 2 04/22/2013 0922   VLDL 7.2 04/22/2013 0922   LDLCALC 56 04/22/2013 0922     HEMOGLOBIN A1C Lab Results  Component Value Date   HGBA1C 4.5 (L) 11/27/2014   TSH No results for input(s): TSH in the last 8760 hours.  External labs:   NA Medications and allergies  No Known Allergies   Medication prior to this encounter:   Outpatient Medications Prior to Visit  Medication Sig Dispense Refill   Multiple Vitamin (MULTIVITAMIN) tablet Take 1 tablet by mouth daily.     ciprofloxacin (CIPRO) 500 MG/5ML (10%) suspension Take 500 mg by mouth 2 (two) times daily.     No facility-administered medications prior to visit.     Medication list after today's encounter   Current Outpatient Medications  Medication Instructions   Multiple Vitamin (MULTIVITAMIN) tablet 1 tablet, Oral, Daily    Radiology:   No  results found.  Cardiac Studies:   NA  EKG:   EKG 09/29/2021: Normal sinus rhythm at rate of 89 bpm, left atrial enlargement, normal axis.  No evidence of ischemia, otherwise normal EKG.    Assessment  ICD-10-CM   1. Preoperative cardiovascular examination  Z01.810     2. Systolic murmur  [id number] EKG 12-Lead    PCV ECHOCARDIOGRAM COMPLETE    3. 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  Z98.84        Medications Discontinued During This Encounter  Medication Reason   ciprofloxacin (CIPRO) 500 MG/5ML (10%) suspension     No orders of the defined types were placed in this encounter.  Orders Placed This Encounter  Procedures   EKG 12-Lead   PCV ECHOCARDIOGRAM COMPLETE    Standing Status:   Future    Standing Expiration Date:   09/29/2022   Recommendations:   [name] is a 55 y.o. African-American male patient with morbid obesity, history of gastric bypass surgery on 06/19/2012 and has lost significant amount of weight, has had DVT in the past postprocedure left TKA in 2014, now referred to me for evaluation of heart murmur and preop cardiac restratification.  Since gastric bypass and weight loss, hypertension and diabetes mellitus has resolved.  He is scheduled for right knee arthroplasty and replacement soon by Dr. Frederik Pear, in view of her, he was referred to me.  His physical examination is unremarkable, has a very soft murmur in the left parasternal region and apex.  In view of prior history of morbid obesity, pulmonary hypertension need to be excluded, there is no clinical evidence of heart failure, I will obtain an echocardiogram.  However his activity level in general is >7-8 METS a day without any discomfort in chest or dyspnea.  Hence he can be taken up for the upcoming surgery with low risk.  I will see him back on a as needed basis.  I have congratulated him on having lost weight, I still would prefer him to have baseline weight of around 180  pounds.   Adrian Prows, MD, [facility] 09/29/2021, 12:07 PM Office: [phone]

## 2021-10-06 ENCOUNTER — Ambulatory Visit: Payer: BC Managed Care – PPO

## 2021-10-06 ENCOUNTER — Other Ambulatory Visit: Payer: Self-pay

## 2021-10-06 DIAGNOSIS — R011 Cardiac murmur, unspecified: Secondary | ICD-10-CM

## 2021-10-11 NOTE — Progress Notes (Signed)
Called patient, NA, LMAM

## 2021-10-11 NOTE — Progress Notes (Signed)
Essentially normal echocardiogram.

## 2021-10-12 NOTE — Progress Notes (Signed)
Called patient, left echocardiogram on VM.

## 2022-12-31 ENCOUNTER — Encounter (HOSPITAL_BASED_OUTPATIENT_CLINIC_OR_DEPARTMENT_OTHER): Payer: Self-pay | Admitting: Emergency Medicine

## 2022-12-31 ENCOUNTER — Other Ambulatory Visit: Payer: Self-pay

## 2022-12-31 ENCOUNTER — Emergency Department (HOSPITAL_BASED_OUTPATIENT_CLINIC_OR_DEPARTMENT_OTHER)
Admission: EM | Admit: 2022-12-31 | Discharge: 2022-12-31 | Disposition: A | Discharge status: Home / Self Care | Payer: No Typology Code available for payment source | Attending: Emergency Medicine | Admitting: Emergency Medicine

## 2022-12-31 ENCOUNTER — Emergency Department (HOSPITAL_BASED_OUTPATIENT_CLINIC_OR_DEPARTMENT_OTHER): Payer: No Typology Code available for payment source | Admitting: Radiology

## 2022-12-31 DIAGNOSIS — R002 Palpitations: Secondary | ICD-10-CM | POA: Insufficient documentation

## 2022-12-31 LAB — BASIC METABOLIC PANEL
Anion gap: 8 (ref 5–15)
BUN: 25 mg/dL — ABNORMAL HIGH (ref 6–20)
CO2: 24 mmol/L (ref 22–32)
Calcium: 9.5 mg/dL (ref 8.9–10.3)
Chloride: 107 mmol/L (ref 98–111)
Creatinine, Ser: 1.03 mg/dL (ref 0.61–1.24)
GFR, Estimated: >60 mL/min (ref >60)
Glucose, Bld: 89 mg/dL (ref 70–99)
Potassium: 3.8 mmol/L (ref 3.5–5.1)
Sodium: 139 mmol/L (ref 135–145)

## 2022-12-31 LAB — CBC
HCT: 34.6 % — ABNORMAL LOW (ref 39.0–52.0)
Hemoglobin: 11.1 g/dL — ABNORMAL LOW (ref 13.0–17.0)
MCH: 26.5 pg (ref 26.0–34.0)
MCHC: 32.1 g/dL (ref 30.0–36.0)
MCV: 82.6 fL (ref 80.0–100.0)
Platelets: 198 10*3/uL (ref 150–400)
RBC: 4.19 MIL/uL — ABNORMAL LOW (ref 4.22–5.81)
RDW: 14.4 % (ref 11.5–15.5)
WBC: 6 10*3/uL (ref 4.0–10.5)
nRBC: 0 % (ref 0.0–0.2)

## 2022-12-31 LAB — TSH: TSH: 3.81 u[IU]/mL (ref 0.350–4.500)

## 2022-12-31 LAB — MAGNESIUM: Magnesium: 2 mg/dL (ref 1.7–2.4)

## 2022-12-31 LAB — TROPONIN I (HIGH SENSITIVITY)
Troponin I (High Sensitivity): 2 ng/L (ref <18)
Troponin I (High Sensitivity): <2 ng/L (ref <18)

## 2022-12-31 LAB — D-DIMER, QUANTITATIVE: D-Dimer, Quant: 0.39 ug/mL-FEU (ref 0.00–0.50)

## 2022-12-31 NOTE — ED Triage Notes (Signed)
Woken up with heart palpitations and discomfort in left arm s

## 2022-12-31 NOTE — Discharge Instructions (Addendum)
Your testing is reassuring.  There is no evidence of heart attack or blood clot in the lung.  Follow-up with your cardiologist Dr. Einar Gip to get a heart monitor to wear at home.  He may also want to schedule you for a stress test.  Return to the ED with exertional chest pain, pain associate with shortness of breath, nausea, vomiting, sweating or any other concerns.

## 2022-12-31 NOTE — ED Provider Notes (Signed)
[facility] Provider Note   CSN: ID:[id number] Arrival date & time: 12/31/22  0118     History  Chief Complaint  Patient presents with   Palpitations    [name] is a 57 y.o. male.  Patient with a history of previous joint replacements as well as gastric bypass and on no chronic medications presenting with sensation of palpitations.  States he went to sleep around midnight feeling well.  He woke up about an hour later with a sensation of "feeling like my heart was stopping".  He states he was startled out of his sleep and felt he had this strange sensation in his chest with fluttering and palpitations.  His still having the fluttering and palpitations with a sensation of heart stopping has resolved.  Denies chest pain, shortness of breath, nausea, vomiting, diaphoresis, chills, cough or fever.  No previous cardiac history.  Echocardiogram in 2022 was normal.  No previous stress test. Was told he had a "abnormal heartbeat" in the past but was told it was nothing to worry about. He denies feeling dizzy or lightheaded.  He denies any sensation of needing to pass out. Eating and drinking well.  No recent bleeding or bloody stools. Did have some fleeting numbness to his left arm but this has resolved. Did have a history of DVT remotely after his knee surgery but is not on anticoagulation any longer.  The history is provided by the patient.  Palpitations Associated symptoms: no cough, no dizziness, no nausea, no shortness of breath, no vomiting and no weakness        Home Medications Prior to Admission medications   Medication Sig Start Date End Date Taking? Authorizing Provider  Multiple Vitamin (MULTIVITAMIN) tablet Take 1 tablet by mouth daily.    [provider]      Allergies    Patient has no known allergies.    Review of Systems   Review of Systems  Constitutional:  Negative for activity change, appetite change and  fever.  HENT:  Negative for congestion and rhinorrhea.   Eyes:  Negative for visual disturbance.  Respiratory:  Negative for cough, chest tightness and shortness of breath.   Cardiovascular:  Positive for palpitations.  Gastrointestinal:  Negative for abdominal pain, nausea and vomiting.  Genitourinary:  Negative for dysuria and hematuria.  Musculoskeletal:  Negative for arthralgias and myalgias.  Skin:  Negative for rash.  Neurological:  Negative for dizziness, weakness and headaches.   all other systems are negative except as noted in the HPI and PMH.    Physical Exam Updated Vital Signs BP (!) 140/83   Pulse 70   Temp 97.8 F (36.6 C) (Oral)   Resp 16   SpO2 100%  Physical Exam Vitals and nursing note reviewed.  Constitutional:      General: He is not in acute distress.    Appearance: He is well-developed.  HENT:     Head: Normocephalic and atraumatic.     Mouth/Throat:     Pharynx: No oropharyngeal exudate.  Eyes:     Conjunctiva/sclera: Conjunctivae normal.     Pupils: Pupils are equal, round, and reactive to light.  Neck:     Comments: No meningismus. Cardiovascular:     Rate and Rhythm: Normal rate and regular rhythm.     Heart sounds: Normal heart sounds. No murmur heard. Pulmonary:     Effort: Pulmonary effort is normal. No respiratory distress.     Breath sounds: Normal  breath sounds.  Abdominal:     Palpations: Abdomen is soft.     Tenderness: There is no abdominal tenderness. There is no guarding or rebound.  Musculoskeletal:        General: No tenderness. Normal range of motion.     Cervical back: Normal range of motion and neck supple.  Skin:    General: Skin is warm.  Neurological:     Mental Status: He is alert and oriented to person, place, and time.     Cranial Nerves: No cranial nerve deficit.     Motor: No abnormal muscle tone.     Coordination: Coordination normal.     Comments:  5/5 strength throughout. CN 2-12 intact.Equal grip strength.    Psychiatric:        Behavior: Behavior normal.     ED Results / Procedures / Treatments   Labs (all labs ordered are listed, but only abnormal results are displayed) Labs Reviewed  BASIC METABOLIC PANEL - Abnormal; Notable for the following components:      Result Value   BUN 25 (*)    All other components within normal limits  CBC - Abnormal; Notable for the following components:   RBC 4.19 (*)    Hemoglobin 11.1 (*)    HCT 34.6 (*)    All other components within normal limits  D-DIMER, QUANTITATIVE  MAGNESIUM  TSH  TROPONIN I (HIGH SENSITIVITY)  TROPONIN I (HIGH SENSITIVITY)    EKG EKG Interpretation  Date/Time:  Sunday December 31 2022 01:25:40 EST Ventricular Rate:  70 PR Interval:  271 QRS Duration: 96 QT Interval:  408 QTC Calculation: 441 R Axis:   86 Text Interpretation: Sinus rhythm Prolonged PR interval No significant change was found Confirmed by Ezequiel Essex [phone]) on 12/31/2022 1:27:21 AM  Radiology DG Chest 2 View  Result Date: 12/31/2022 CLINICAL DATA:  Chest pain, palpitations EXAM: CHEST - 2 VIEW COMPARISON:  09/05/2013 FINDINGS: Cardiac and mediastinal contours are within normal limits. No focal pulmonary opacity. No pleural effusion or pneumothorax. No acute osseous abnormality. IMPRESSION: No acute cardiopulmonary process. Electronically Signed   By: Merilyn Baba M.D.   On: 12/31/2022 03:09    Procedures Procedures    Medications Ordered in ED Medications - No data to display  ED Course/ Medical Decision Making/ A&P                             Medical Decision Making Amount and/or Complexity of Data Reviewed Labs: ordered. Decision-making details documented in ED Course. Radiology: ordered and independent interpretation performed. Decision-making details documented in ED Course. ECG/medicine tests: ordered and independent interpretation performed. Decision-making details documented in ED Course.   Palpitations, fluttering sensation  in chest with "sensation of heart stopping".  No Chest pain.  No shortness of breath.  EKG is sinus rhythm with no acute ST changes.  No Brugada no prolonged QT.  Hemoglobin stable at 11.  D-dimer is negative  Troponin Negative.  Electrolytes reassuring.  Monitored in the ED on telemetry with no evidence of arrhythmia or ectopy.  Patient reports his palpitations are improving.  Still has no chest pain or shortness of breath.  Troponin negative x 2 with low suspicion for ACS or pulmonary embolism.  No hypoxia, tachycardia, tachypnea or increased work of breathing.  Discussed follow-up with his cardiologist for Holter monitor and consideration of stress test.  Return to the ED with exertional chest pain, pain associate with  shortness of breath, nausea, vomiting, diaphoresis or other concerns.       Final Clinical Impression(s) / ED Diagnoses Final diagnoses:  Palpitations    Rx / DC Orders ED Discharge Orders     None         Adolfo Granieri, Annie Main, MD 12/31/22 [phone]

## 2022-12-31 NOTE — ED Notes (Signed)
RN reviewed discharge instructions with pt. Pt verbalized understanding and had no further questions. VSS upon discharge.  

## 2022-12-31 NOTE — ED Notes (Signed)
Patient transported to X-ray 

## 2023-01-03 ENCOUNTER — Ambulatory Visit: Payer: No Typology Code available for payment source | Admitting: Cardiology

## 2023-01-03 ENCOUNTER — Encounter: Payer: Self-pay | Admitting: Cardiology

## 2023-01-03 VITALS — BP 131/75 | HR 68 | Resp 18 | Ht 69.0 in | Wt 237.6 lb

## 2023-01-03 DIAGNOSIS — R002 Palpitations: Secondary | ICD-10-CM

## 2023-01-03 DIAGNOSIS — E6609 Other obesity due to excess calories: Secondary | ICD-10-CM

## 2023-01-03 DIAGNOSIS — I440 Atrioventricular block, first degree: Secondary | ICD-10-CM

## 2023-01-03 DIAGNOSIS — R0683 Snoring: Secondary | ICD-10-CM

## 2023-01-03 NOTE — Progress Notes (Signed)
Primary Physician/Referring:  No primary care provider on file.  Patient ID: [name], male    DOB: 05-21-1966, 57 y.o.   MRN: [id number]  Chief Complaint  Patient presents with   Palpitations   Hospitalization Follow-up   HPI:    [name]  is a 57 y.o. frican-American male patient with morbid obesity, history of gastric bypass surgery on 06/19/2012 and has lost significant amount of weight, has had DVT in the past postprocedure left TKA in 2014, but no recurrence of any DVT, I had last seen him about 2 years ago referred back to me for evaluation of palpitations.  Patient presented on 12/31/2022 with palpitations, symptoms of skipping heartbeats and feeling like the heart was going to stop, got extremely anxious.  Otherwise no other symptoms, no chest pain, no shortness of breath.  He continues to exercise fairly regularly and also rides bikes echo without any chest pain or dyspnea.  He is able to climb 3 flights of stairs without any discomfort.  Most of the episodes are occurring during rest or routine activities but not with exertion activity.  Past Medical History:  Diagnosis Date   Back pain    compensating from hip pain   History of colon polyps    Joint pain    Osteoarthritis    "knees" 10/22/2014)   Past Surgical History:  Procedure Laterality Date   BREATH TEK H PYLORI  02/14/2012   Procedure: BREATH TEK H PYLORI;  Surgeon: Shann Medal, MD;  Location: [facility];  Service: General;  Laterality: N/A;   COLONOSCOPY     ESOPHAGOGASTRODUODENOSCOPY     FRACTURE SURGERY     GASTRIC ROUX-EN-Y  06/17/2012   Procedure: LAPAROSCOPIC ROUX-EN-Y GASTRIC;  Surgeon: Shann Medal, MD;  Location: [facility];  Service: General;  Laterality: N/A;   JOINT REPLACEMENT     KNEE HARDWARE REMOVAL Left 1986   "took pin out"   PATELLA FRACTURE SURGERY Left 1985   pin placed   TONSILLECTOMY AND ADENOIDECTOMY  ~ [place] Right 10/21/2014   Procedure: TOTAL HIP  ARTHROPLASTY;  Surgeon: Kerin Salen, MD;  Location: [facility];  Service: Orthopedics;  Laterality: Right;   TOTAL KNEE ARTHROPLASTY Left 09/12/2013   Procedure: TOTAL KNEE ARTHROPLASTY;  Surgeon: Kerin Salen, MD;  Location: [facility];  Service: Orthopedics;  Laterality: Left;   WRIST FRACTURE SURGERY Left ~ 1975   Family History  Problem Relation Age of Onset   Diabetes Mother 47   Stomach cancer Mother 49   Hypertension Mother    Colon cancer Mother    Lung cancer Father 41   Hypertension Father    Alcohol abuse Father    Esophageal cancer Neg Hx    Rectal cancer Neg Hx     Social History   Tobacco Use   Smoking status: Never   Smokeless tobacco: Never  Substance Use Topics   Alcohol use: No   Marital Status: Married  ROS  Review of Systems  Cardiovascular:  Positive for palpitations. Negative for chest pain, dyspnea on exertion and leg swelling.  Gastrointestinal:  Negative for melena.   Objective  Blood pressure 131/75, pulse 68, resp. rate 18, height '5\' 9"'$  (1.753 m), weight 237 lb 9.6 oz (107.8 kg), SpO2 94 %. Body mass index is 35.09 kg/m.     01/03/2023    2:12 PM 12/31/2022    5:35 AM 12/31/2022    5:30 AM  Vitals  with BMI  Height '5\' 9"'$     Weight 237 lbs 10 oz    BMI 123456    Systolic [id number]  Diastolic 75 82 79  Pulse 68 66 59    Physical Exam Constitutional:      Appearance: He is obese.  Neck:     Vascular: No carotid bruit or JVD.  Cardiovascular:     Rate and Rhythm: Normal rate and regular rhythm.     Pulses: Intact distal pulses.     Heart sounds: Normal heart sounds. No murmur heard.    No gallop.  Pulmonary:     Effort: Pulmonary effort is normal.     Breath sounds: Normal breath sounds.  Abdominal:     General: Bowel sounds are normal.     Palpations: Abdomen is soft.  Musculoskeletal:     Right lower leg: No edema.     Left lower leg: No edema.      Laboratory examination:   Recent Labs    12/31/22 0131  NA 139  K 3.8  CL 107   CO2 24  GLUCOSE 89  BUN 25*  CREATININE 1.03  CALCIUM 9.5  GFRNONAA >60   estimated creatinine clearance is 99.2 mL/min (by C-G formula based on SCr of 1.03 mg/dL).     Latest Ref Rng & Units 12/31/2022    1:31 AM 11/27/2014    7:48 AM 10/22/2014    5:17 AM  CMP  Glucose 70 - 99 mg/dL 89  81  126   BUN 6 - 20 mg/dL '25  16  10   '$ Creatinine 0.61 - 1.24 mg/dL 1.03  0.94  0.78   Sodium 135 - 145 mmol/L 139  139  136   Potassium 3.5 - 5.1 mmol/L 3.8  4.1  4.4   Chloride 98 - 111 mmol/L 107  105  104   CO2 22 - 32 mmol/L '24  30  28   '$ Calcium 8.9 - 10.3 mg/dL 9.5  9.3  8.6   Total Protein 6.0 - 8.3 g/dL  6.6    Total Bilirubin 0.2 - 1.2 mg/dL  0.6    Alkaline Phos 39 - 117 U/L  112    AST 0 - 37 U/L  15    ALT 0 - 53 U/L  13        Latest Ref Rng & Units 12/31/2022    1:31 AM 11/27/2014    7:48 AM 10/24/2014    3:11 AM  CBC  WBC 4.0 - 10.5 K/uL 6.0  4.3  8.1   Hemoglobin 13.0 - 17.0 g/dL 11.1  11.3  8.6   Hematocrit 39.0 - 52.0 % 34.6  33.8  25.5   Platelets 150 - 400 K/uL 198  195.0  145     Lipid Panel No results for input(s): "CHOL", "TRIG", "LDLCALC", "VLDL", "HDL", "CHOLHDL", "LDLDIRECT" in the last 8760 hours. Lipid Panel     Component Value Date/Time   CHOL 109 04/22/2013 0922   TRIG 36.0 04/22/2013 0922   HDL 45.70 04/22/2013 0922   CHOLHDL 2 04/22/2013 0922   VLDL 7.2 04/22/2013 0922   LDLCALC 56 04/22/2013 0922     HEMOGLOBIN A1C Lab Results  Component Value Date   HGBA1C 4.5 (L) 11/27/2014   TSH Recent Labs    12/31/22 0143  TSH 3.810   Medications and allergies  No Known Allergies   Medication    Outpatient Medications Prior to Visit  Medication Sig Dispense Refill  Cholecalciferol (VITAMIN D) 125 MCG (5000 UT) CAPS Take 1 capsule by mouth daily.     Multiple Vitamin (MULTIVITAMIN) tablet Take 1 tablet by mouth daily.     tiZANidine (ZANAFLEX) 2 MG tablet Take 2 mg by mouth every 6 (six) hours as needed for muscle spasms.     ibuprofen (ADVIL)  800 MG tablet Take 800 mg by mouth 3 (three) times daily.     No facility-administered medications prior to visit.    Radiology:   Chest x-ray two-view 12/31/2022: Cardiac and mediastinal contours are within normal limits. No focal pulmonary opacity. No pleural effusion or pneumothorax. No acute osseous abnormality.  Cardiac Studies:   PCV ECHOCARDIOGRAM COMPLETE 10/06/2021  Narrative Echocardiogram 10/06/2021: Normal LV systolic function with visual EF 55-60%. Left ventricle cavity is normal in size. Normal left ventricular wall thickness. Normal global wall motion. Normal diastolic filling pattern, normal LAP. Native mitral valve with billowing of mitral valve leaflets. Trace mitral regurgitation. No evidence of mitral valve stenosis. No prior study for comparison.     EKG:   EKG 01/03/2023: Sinus rhythm with first-degree block at the rate of 62 bpm, otherwise normal EKG.  Compared to 09/29/2021, no significant change.   Assessment     ICD-10-CM   1. Palpitations  R00.2 EKG 12-Lead    Ambulatory referral to [facility]    2. 1st degree AV block  I44.0     3. Class 2 obesity due to excess calories without serious comorbidity with body mass index (BMI) of 35.0 to 35.9 in adult  E66.09 Ambulatory referral to [facility]   Z68.35     4. Loud snoring  R06.83 Ambulatory referral to Sleep Studies       Medications Discontinued During This Encounter  Medication Reason   ibuprofen (ADVIL) 800 MG tablet      No orders of the defined types were placed in this encounter.  Orders Placed This Encounter  Procedures   Ambulatory referral to Family Practice    Referral Priority:   Routine    Referral Type:   Consultation    Referral Reason:   Specialty Services Required    Requested Specialty:   Family Medicine    Number of Visits Requested:   1   Ambulatory referral to Sleep Studies    Referral Priority:   Routine    Referral Type:   Consultation    Referral Reason:    Specialty Services Required    Referred to Provider:   Elmarie Mainland, MD    Number of Visits Requested:   1   EKG 12-Lead   Recommendations:   [name] is a 57 y.o. African-American male patient with morbid obesity, history of gastric bypass surgery on 06/19/2012 and has lost significant amount of weight, has had DVT in the past postprocedure left TKA in 2014, but no recurrence of any DVT, I had last seen him about 2 years ago referred back to me for evaluation of palpitations.  1. Palpitations Patient presented on 12/31/2022 with palpitations, symptoms of skipping heartbeats and feeling like the heart was going to stop, got extremely anxious.  Symptoms are clearly indicated of PVCs.  I have explained to the patient regarding the mechanism of PVCs.  Do not think atrial fibrillation or SVT.  No indication for performing monitoring for now.  Of course if he has frequent episodes of is extremely anxious about this, we could certainly do monitoring.  2. 1st degree AV block Patient has  chronic underlying first-degree AV block, asymptomatic.  3. Class 2 obesity due to excess calories without serious comorbidity with body mass index (BMI) of 35.0 to 35.9 in adult Patient has lost close to 90-110 pounds in weight since gastric bypass surgery and has maintained weight loss extremely careful with his diet and also continues to exercise and walk on a daily basis without any limitations.  No chest pain or shortness of breath.  Otherwise remains asymptomatic.  I have congratulated him.  4. Loud snoring Patient has been told by his wife that he snores very loudly and has apneic episodes.  This may be another etiology for his PVCs.  I will schedule him for a sleep study as well.  I will see him back on a as needed basis.  I have congratulated him on having lost weight, I still would prefer him to have baseline weight of around 200 pounds.   Adrian Prows, MD, [facility] 01/03/2023, 2:55 PM Office:  [phone]

## 2023-01-11 ENCOUNTER — Ambulatory Visit: Payer: No Typology Code available for payment source | Admitting: Cardiology

## 2024-01-21 ENCOUNTER — Other Ambulatory Visit: Payer: Self-pay

## 2024-01-21 ENCOUNTER — Emergency Department (HOSPITAL_COMMUNITY)
Admission: EM | Admit: 2024-01-21 | Discharge: 2024-01-21 | Disposition: A | Discharge status: Home / Self Care | Attending: Emergency Medicine | Admitting: Emergency Medicine

## 2024-01-21 ENCOUNTER — Emergency Department (HOSPITAL_COMMUNITY)

## 2024-01-21 ENCOUNTER — Encounter (HOSPITAL_COMMUNITY): Payer: Self-pay

## 2024-01-21 DIAGNOSIS — M79642 Pain in left hand: Secondary | ICD-10-CM | POA: Insufficient documentation

## 2024-01-21 DIAGNOSIS — M25562 Pain in left knee: Secondary | ICD-10-CM | POA: Diagnosis present

## 2024-01-21 DIAGNOSIS — M79652 Pain in left thigh: Secondary | ICD-10-CM | POA: Insufficient documentation

## 2024-01-21 DIAGNOSIS — Y92410 Unspecified street and highway as the place of occurrence of the external cause: Secondary | ICD-10-CM | POA: Insufficient documentation

## 2024-01-21 NOTE — Discharge Instructions (Addendum)
 It was a pleasure taking care of you here in the ED today.  Your imaging today was reassuring  Return for new or worsening symptoms
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
## 2024-01-29 ENCOUNTER — Ambulatory Visit: Attending: Orthopedic Surgery | Admitting: Physical Therapy

## 2024-01-29 ENCOUNTER — Other Ambulatory Visit: Payer: Self-pay

## 2024-01-29 ENCOUNTER — Encounter: Payer: Self-pay | Admitting: Physical Therapy

## 2024-01-29 DIAGNOSIS — M5442 Lumbago with sciatica, left side: Secondary | ICD-10-CM | POA: Diagnosis present

## 2024-01-29 DIAGNOSIS — R262 Difficulty in walking, not elsewhere classified: Secondary | ICD-10-CM | POA: Insufficient documentation

## 2024-01-29 DIAGNOSIS — M542 Cervicalgia: Secondary | ICD-10-CM | POA: Insufficient documentation

## 2024-01-29 DIAGNOSIS — M6281 Muscle weakness (generalized): Secondary | ICD-10-CM | POA: Insufficient documentation

## 2024-01-29 NOTE — Patient Instructions (Signed)
 Aquatic Therapy at [facility]-  What to Expect!  Where:   [facility] [address] [place], [place] [address] Rehab phone [phone]  NOTE:  You will receive an automated phone message reminding you of your appt and it will say the appointment is at the [facility] clinic.          How to Prepare: Please make sure you drink 8 ounces of water about one hour prior to your pool session A caregiver may attend if needed with the patient to help assist as needed. A caregiver can sit in the pool room on chair. Please arrive IN YOUR SUIT and 15 minutes prior to your appointment - this helps to avoid delays in starting your session. Please make sure to attend to any toileting needs prior to entering the pool Pajonal rooms for changing are provided.   There is direct access to the pool deck form the locker room.  You can lock your belongings in a locker with lock provided. Once on the pool deck your therapist will ask if you have signed the Patient  Consent and Assignment of Benefits form before beginning treatment Your therapist may take your blood pressure prior to, during and after your session if indicated We usually try and create a home exercise program based on activities we do in the pool.  Please be thinking about who might be able to assist you in the pool should you need to participate in an aquatic home exercise program at the time of discharge if you need assistance.  Some patients do not want to or do not have the ability to participate in an aquatic home program - this is not a barrier in any way to you participating in aquatic therapy as part of your current therapy plan! After Discharge from PT, you can continue using home program at  the [facility], there is a drop-in fee for $5 ($45 a month)or for 60 years  or older $4.00 ($40 a month for seniors ) or any local [employer] pool.  Memberships for purchase are  available for gym/pool at [facility]  IT IS VERY IMPORTANT THAT YOUR LAST VISIT BE IN THE CLINIC AT [facility] [address] AFTER YOUR LAST AQUATIC VISIT.  PLEASE MAKE SURE THAT YOU HAVE A [address]  APPOINTMENT SCHEDULED.   About the pool: Pool is located approximately 500 FT from the entrance of the building.  Please bring a support person if you need assistance traveling this      distance.   Your therapist will assist you in entering the water; there are two ways to           enter: stairs with railings, and a mechanical lift. Your therapist will determine the most appropriate way for you.  Water temperature is usually between 88-90 degrees  There may be up to 2 other swimmers in the pool at the same time  The pool deck is tile, please wear shoes with good traction if you prefer not to be barefoot.    Contact Info:  For appointment scheduling and cancellations:         Please call the [facility]  PH:[phone]              Aquatic Therapy  Outpatient Rehabilitation @ [facility]       All sessions are 45 minutes  Garen Lah, PT, ATRIC Certified Exercise Expert for the Aging Adult  01/29/24 8:48 AM Phone: [phone] Fax: [phone]
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
## 2024-01-30 ENCOUNTER — Ambulatory Visit: Admitting: Physical Therapy

## 2024-01-30 ENCOUNTER — Encounter: Payer: Self-pay | Admitting: Physical Therapy

## 2024-01-30 DIAGNOSIS — M6281 Muscle weakness (generalized): Secondary | ICD-10-CM

## 2024-01-30 DIAGNOSIS — M5442 Lumbago with sciatica, left side: Secondary | ICD-10-CM

## 2024-01-30 DIAGNOSIS — R262 Difficulty in walking, not elsewhere classified: Secondary | ICD-10-CM

## 2024-01-30 DIAGNOSIS — M542 Cervicalgia: Secondary | ICD-10-CM
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
## 2024-02-07 ENCOUNTER — Encounter: Payer: Self-pay | Admitting: Physical Therapy

## 2024-02-07 ENCOUNTER — Ambulatory Visit: Admitting: Physical Therapy

## 2024-02-07 DIAGNOSIS — R262 Difficulty in walking, not elsewhere classified: Secondary | ICD-10-CM

## 2024-02-07 DIAGNOSIS — M6281 Muscle weakness (generalized): Secondary | ICD-10-CM

## 2024-02-07 DIAGNOSIS — M5442 Lumbago with sciatica, left side: Secondary | ICD-10-CM | POA: Diagnosis not present

## 2024-02-07 DIAGNOSIS — M542 Cervicalgia: Secondary | ICD-10-CM
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
## 2024-02-12 ENCOUNTER — Encounter: Admitting: Physical Therapy
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
## 2024-02-13 ENCOUNTER — Encounter: Payer: Self-pay | Admitting: Physical Therapy

## 2024-02-13 ENCOUNTER — Ambulatory Visit: Admitting: Physical Therapy

## 2024-02-13 DIAGNOSIS — M542 Cervicalgia: Secondary | ICD-10-CM

## 2024-02-13 DIAGNOSIS — M5442 Lumbago with sciatica, left side: Secondary | ICD-10-CM | POA: Diagnosis not present

## 2024-02-13 DIAGNOSIS — M6281 Muscle weakness (generalized): Secondary | ICD-10-CM

## 2024-02-13 DIAGNOSIS — R262 Difficulty in walking, not elsewhere classified: Secondary | ICD-10-CM
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
## 2024-02-14 ENCOUNTER — Ambulatory Visit: Admitting: Physical Therapy

## 2024-02-19 ENCOUNTER — Ambulatory Visit: Admitting: Physical Therapy

## 2024-02-19 ENCOUNTER — Encounter: Payer: Self-pay | Admitting: Physical Therapy

## 2024-02-19 DIAGNOSIS — M5442 Lumbago with sciatica, left side: Secondary | ICD-10-CM | POA: Diagnosis not present

## 2024-02-19 DIAGNOSIS — M6281 Muscle weakness (generalized): Secondary | ICD-10-CM
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
## 2024-02-21 ENCOUNTER — Encounter: Payer: Self-pay | Admitting: Physical Therapy

## 2024-02-21 ENCOUNTER — Ambulatory Visit: Attending: Orthopedic Surgery | Admitting: Physical Therapy

## 2024-02-21 ENCOUNTER — Encounter: Admitting: Physical Therapy

## 2024-02-21 DIAGNOSIS — M542 Cervicalgia: Secondary | ICD-10-CM | POA: Diagnosis present

## 2024-02-21 DIAGNOSIS — R262 Difficulty in walking, not elsewhere classified: Secondary | ICD-10-CM | POA: Diagnosis present

## 2024-02-21 DIAGNOSIS — M5442 Lumbago with sciatica, left side: Secondary | ICD-10-CM | POA: Insufficient documentation

## 2024-02-21 DIAGNOSIS — M6281 Muscle weakness (generalized): Secondary | ICD-10-CM | POA: Diagnosis present
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
## 2024-02-22 ENCOUNTER — Encounter: Payer: Self-pay | Admitting: Physical Therapy

## 2024-02-26 ENCOUNTER — Ambulatory Visit: Admitting: Physical Therapy

## 2024-02-26 ENCOUNTER — Encounter: Payer: Self-pay | Admitting: Physical Therapy

## 2024-02-26 DIAGNOSIS — M5442 Lumbago with sciatica, left side: Secondary | ICD-10-CM

## 2024-02-26 DIAGNOSIS — M542 Cervicalgia: Secondary | ICD-10-CM

## 2024-02-26 DIAGNOSIS — M6281 Muscle weakness (generalized): Secondary | ICD-10-CM

## 2024-02-26 DIAGNOSIS — R262 Difficulty in walking, not elsewhere classified: Secondary | ICD-10-CM
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
## 2024-02-27 ENCOUNTER — Ambulatory Visit: Admitting: Physical Therapy

## 2024-02-28 ENCOUNTER — Encounter: Admitting: Physical Therapy

## 2024-05-23 ENCOUNTER — Ambulatory Visit
Admission: EM | Admit: 2024-05-23 | Discharge: 2024-05-23 | Disposition: A | Discharge status: Home / Self Care | Attending: Family Medicine | Admitting: Family Medicine

## 2024-05-23 DIAGNOSIS — T22112A Burn of first degree of left forearm, initial encounter: Secondary | ICD-10-CM

## 2024-05-23 DIAGNOSIS — T23162A Burn of first degree of back of left hand, initial encounter: Secondary | ICD-10-CM | POA: Diagnosis not present

## 2024-05-23 MED ORDER — SILVER SULFADIAZINE 1 % EX CREA: 1.0000 | TOPICAL_CREAM | Freq: Two times a day (BID) | CUTANEOUS | 0 refills | Status: DC
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
## 2024-05-23 NOTE — Discharge Instructions (Signed)
Please change your dressing 2-3 times daily. Apply silvadene cream generously and secure with a non-stick dressing. Each time you change your dressing, make sure you clean gently around the perimeter of the wound with gentle soap and warm water. Do not peel off any dead skin. If it comes off in the process of washing your wound or removing the dressing, that's okay. Pat your wound dry and let it air out if possible for an hour before reapplying another dressing.

## 2024-06-06 ENCOUNTER — Emergency Department (HOSPITAL_BASED_OUTPATIENT_CLINIC_OR_DEPARTMENT_OTHER)

## 2024-06-06 ENCOUNTER — Other Ambulatory Visit: Payer: Self-pay

## 2024-06-06 ENCOUNTER — Observation Stay (HOSPITAL_BASED_OUTPATIENT_CLINIC_OR_DEPARTMENT_OTHER)
Admission: EM | Admit: 2024-06-06 | Discharge: 2024-06-08 | Disposition: A | Discharge status: Home / Self Care | Attending: Family Medicine | Admitting: Family Medicine

## 2024-06-06 ENCOUNTER — Encounter (HOSPITAL_BASED_OUTPATIENT_CLINIC_OR_DEPARTMENT_OTHER): Payer: Self-pay | Admitting: Family Medicine

## 2024-06-06 DIAGNOSIS — Z7982 Long term (current) use of aspirin: Secondary | ICD-10-CM | POA: Insufficient documentation

## 2024-06-06 DIAGNOSIS — Z9884 Bariatric surgery status: Secondary | ICD-10-CM | POA: Insufficient documentation

## 2024-06-06 DIAGNOSIS — G459 Transient cerebral ischemic attack, unspecified: Secondary | ICD-10-CM

## 2024-06-06 DIAGNOSIS — R202 Paresthesia of skin: Secondary | ICD-10-CM | POA: Diagnosis present

## 2024-06-06 DIAGNOSIS — R200 Anesthesia of skin: Secondary | ICD-10-CM | POA: Diagnosis present

## 2024-06-06 DIAGNOSIS — I82459 Acute embolism and thrombosis of unspecified peroneal vein: Secondary | ICD-10-CM | POA: Diagnosis present

## 2024-06-06 DIAGNOSIS — Z743 Need for continuous supervision: Secondary | ICD-10-CM | POA: Diagnosis not present

## 2024-06-06 DIAGNOSIS — R531 Weakness: Secondary | ICD-10-CM | POA: Diagnosis not present

## 2024-06-06 DIAGNOSIS — I959 Hypotension, unspecified: Secondary | ICD-10-CM | POA: Diagnosis not present

## 2024-06-06 LAB — CBC
HCT: 35.9 % — ABNORMAL LOW (ref 39.0–52.0)
Hemoglobin: 11.4 g/dL — ABNORMAL LOW (ref 13.0–17.0)
MCH: 26.5 pg (ref 26.0–34.0)
MCHC: 31.8 g/dL (ref 30.0–36.0)
MCV: 83.3 fL (ref 80.0–100.0)
Platelets: 216 K/uL (ref 150–400)
RBC: 4.31 MIL/uL (ref 4.22–5.81)
RDW: 13.9 % (ref 11.5–15.5)
WBC: 5.7 K/uL (ref 4.0–10.5)
nRBC: 0 % (ref 0.0–0.2)

## 2024-06-06 LAB — DIFFERENTIAL
Abs Immature Granulocytes: 0.01 K/uL (ref 0.00–0.07)
Basophils Absolute: 0 K/uL (ref 0.0–0.1)
Basophils Relative: 1 %
Eosinophils Absolute: 0.1 K/uL (ref 0.0–0.5)
Eosinophils Relative: 1 %
Immature Granulocytes: 0 %
Lymphocytes Relative: 38 %
Lymphs Abs: 2.1 K/uL (ref 0.7–4.0)
Monocytes Absolute: 0.5 K/uL (ref 0.1–1.0)
Monocytes Relative: 9 %
Neutro Abs: 2.9 K/uL (ref 1.7–7.7)
Neutrophils Relative %: 51 %

## 2024-06-06 LAB — COMPREHENSIVE METABOLIC PANEL WITH GFR
ALT: 19 U/L (ref 0–44)
AST: 23 U/L (ref 15–41)
Albumin: 4.3 g/dL (ref 3.5–5.0)
Alkaline Phosphatase: 94 U/L (ref 38–126)
Anion gap: 11 (ref 5–15)
BUN: 19 mg/dL (ref 6–20)
CO2: 23 mmol/L (ref 22–32)
Calcium: 9.4 mg/dL (ref 8.9–10.3)
Chloride: 104 mmol/L (ref 98–111)
Creatinine, Ser: 1.12 mg/dL (ref 0.61–1.24)
GFR, Estimated: >60 mL/min (ref >60)
Glucose, Bld: 92 mg/dL (ref 70–99)
Potassium: 4 mmol/L (ref 3.5–5.1)
Sodium: 139 mmol/L (ref 135–145)
Total Bilirubin: 0.4 mg/dL (ref 0.0–1.2)
Total Protein: 6.9 g/dL (ref 6.5–8.1)

## 2024-06-06 LAB — HIV ANTIBODY (ROUTINE TESTING W REFLEX): HIV Screen 4th Generation wRfx: NONREACTIVE

## 2024-06-06 LAB — APTT: aPTT: 31 s (ref 24–36)

## 2024-06-06 LAB — URINE DRUG SCREEN
Amphetamines: NOT DETECTED
Barbiturates: NOT DETECTED
Benzodiazepines: NOT DETECTED
Cocaine: NOT DETECTED
Fentanyl: NOT DETECTED
Methadone Scn, Ur: NOT DETECTED
Opiates: NOT DETECTED
Tetrahydrocannabinol: NOT DETECTED

## 2024-06-06 LAB — PROTIME-INR
INR: 1 (ref 0.8–1.2)
Prothrombin Time: 14 s (ref 11.4–15.2)

## 2024-06-06 LAB — ETHANOL: Alcohol, Ethyl (B): <15 mg/dL (ref <15)

## 2024-06-06 LAB — CBG MONITORING, ED: Glucose-Capillary: 81 mg/dL (ref 70–99)

## 2024-06-06 MED ORDER — SENNOSIDES-DOCUSATE SODIUM 8.6-50 MG PO TABS: 1.0000 | ORAL_TABLET | Freq: Every evening | ORAL | Status: DC | PRN

## 2024-06-06 MED ORDER — IOHEXOL 350 MG/ML SOLN
75.0000 mL | Freq: Once | INTRAVENOUS | Status: AC | PRN
  Administered 2024-06-06: 75 mL via INTRAVENOUS

## 2024-06-06 MED ORDER — ACETAMINOPHEN 160 MG/5ML PO SOLN: 650.0000 mg | ORAL | Status: DC | PRN

## 2024-06-06 MED ORDER — ASPIRIN 81 MG PO CHEW
324.0000 mg | CHEWABLE_TABLET | Freq: Once | ORAL | Status: AC
  Administered 2024-06-06: 324 mg via ORAL
  Filled 2024-06-06: qty 4

## 2024-06-06 MED ORDER — STROKE: EARLY STAGES OF RECOVERY BOOK
Freq: Once | Status: AC
  Filled 2024-06-06: qty 1

## 2024-06-06 MED ORDER — ACETAMINOPHEN 650 MG RE SUPP: 650.0000 mg | RECTAL | Status: DC | PRN

## 2024-06-06 MED ORDER — SODIUM CHLORIDE 0.9 % IV SOLN: INTRAVENOUS | Status: AC

## 2024-06-06 MED ORDER — ASPIRIN 81 MG PO TBEC
81.0000 mg | DELAYED_RELEASE_TABLET | Freq: Every day | ORAL | Status: DC
  Administered 2024-06-07 – 2024-06-08 (×2): 81 mg via ORAL
  Filled 2024-06-06 (×2): qty 1

## 2024-06-06 MED ORDER — CLOPIDOGREL BISULFATE 300 MG PO TABS
300.0000 mg | ORAL_TABLET | Freq: Once | ORAL | Status: AC
  Administered 2024-06-06: 300 mg via ORAL
  Filled 2024-06-06: qty 1

## 2024-06-06 MED ORDER — ENOXAPARIN SODIUM 40 MG/0.4ML IJ SOSY
40.0000 mg | PREFILLED_SYRINGE | INTRAMUSCULAR | Status: DC
  Administered 2024-06-06 – 2024-06-07 (×2): 40 mg via SUBCUTANEOUS
  Filled 2024-06-06 (×2): qty 0.4

## 2024-06-06 MED ORDER — CLOPIDOGREL BISULFATE 75 MG PO TABS
75.0000 mg | ORAL_TABLET | Freq: Every day | ORAL | Status: DC
  Administered 2024-06-07 – 2024-06-08 (×2): 75 mg via ORAL
  Filled 2024-06-06 (×2): qty 1

## 2024-06-06 MED ORDER — ACETAMINOPHEN 325 MG PO TABS: 650.0000 mg | ORAL_TABLET | ORAL | Status: DC | PRN

## 2024-06-06 NOTE — Plan of Care (Signed)
   Problem: Education: Goal: Knowledge of General Education information will improve Description Including pain rating scale, medication(s)/side effects and non-pharmacologic comfort measures Outcome: Progressing

## 2024-06-06 NOTE — ED Provider Notes (Signed)
 [place] EMERGENCY DEPARTMENT AT [facility] Provider Note   CSN: [id number] Arrival date & time: 06/06/24  0101     Patient presents with: Numbness   [name] is a 58 y.o. male.   Patient with history of orthopedic issues here with numbness to his left face and arm.  Symptoms started about 40 minutes ago while he was trying to go to sleep (approximately 1245 am)  Does not have any weakness to his arms or legs.  No trouble speaking.  Possible slight facial droop.  Denies headache or vision changes.  Denies chest pain.  No shortness of breath.  Has had intermittent palpitations at nighttime only for the past several weeks that last for several minutes at a time but is fine during the day.  Denies any cardiac history.  No palpitations, chest pain or shortness of breath now.  Was having palpitations when the paresthesias started to the left side of his face.  He denies any blurred vision, slurred speech, weakness, headache.  No chest pain or shortness of breath currently.  Denies any cardiac history.  The history is provided by the patient.       Prior to Admission medications   Medication Sig Start Date End Date Taking? Authorizing Provider  Cholecalciferol (VITAMIN D) 125 MCG (5000 UT) CAPS Take 1 capsule by mouth daily.    [provider]  Multiple Vitamin (MULTIVITAMIN) tablet Take 1 tablet by mouth daily.    [provider]  silver  sulfADIAZINE  (SILVADENE ) 1 % cream Apply 1 Application topically 2 (two) times daily. 05/23/24   Christopher Savannah, PA-C  tadalafil (CIALIS) 5 MG tablet Take 5 mg by mouth daily. 02/05/24   [provider]  tiZANidine (ZANAFLEX) 2 MG tablet Take 2 mg by mouth every 6 (six) hours as needed for muscle spasms. 09/07/22   [provider]    Allergies: Patient has no known allergies.    Review of Systems  Constitutional:  Negative for activity change, appetite change and fever.  HENT:  Negative for congestion.    Respiratory:  Negative for chest tightness and shortness of breath.   Cardiovascular:  Positive for palpitations.  Gastrointestinal:  Negative for abdominal pain, nausea and vomiting.  Genitourinary:  Negative for dysuria and hematuria.  Musculoskeletal:  Negative for arthralgias and myalgias.  Skin:  Negative for rash.  Neurological:  Positive for facial asymmetry and numbness. Negative for speech difficulty, weakness and light-headedness.   all other systems are negative except as noted in the HPI and PMH.    Updated Vital Signs BP 122/80   Pulse (!) 57   Temp 98 F (36.7 C) (Temporal)   Resp 16   SpO2 99%   Physical Exam Vitals and nursing note reviewed.  Constitutional:      General: He is not in acute distress.    Appearance: He is well-developed.  HENT:     Head: Normocephalic and atraumatic.     Mouth/Throat:     Pharynx: No oropharyngeal exudate.  Eyes:     Conjunctiva/sclera: Conjunctivae normal.     Pupils: Pupils are equal, round, and reactive to light.  Neck:     Comments: No meningismus. Cardiovascular:     Rate and Rhythm: Normal rate and regular rhythm.     Heart sounds: Normal heart sounds. No murmur heard. Pulmonary:     Effort: Pulmonary effort is normal. No respiratory distress.     Breath sounds: Normal breath sounds.  Abdominal:  Palpations: Abdomen is soft.     Tenderness: There is no abdominal tenderness. There is no guarding or rebound.  Musculoskeletal:        General: No tenderness. Normal range of motion.     Cervical back: Normal range of motion and neck supple.  Skin:    General: Skin is warm.  Neurological:     Mental Status: He is alert and oriented to person, place, and time.     Cranial Nerves: No cranial nerve deficit.     Motor: No abnormal muscle tone.     Coordination: Coordination normal.     Comments: No obvious facial droop.  Paresthesias to left face and left arm.  No pronator drift.  No ataxia finger-to-nose.  CN 2-12  intact, no ataxia on finger to nose, no nystagmus, 5/5 strength throughout, no pronator drift, Romberg negative, normal gait.   Psychiatric:        Behavior: Behavior normal.     (all labs ordered are listed, but only abnormal results are displayed) Labs Reviewed  CBC - Abnormal; Notable for the following components:      Result Value   Hemoglobin 11.4 (*)    HCT 35.9 (*)    All other components within normal limits  ETHANOL  PROTIME-INR  APTT  DIFFERENTIAL  COMPREHENSIVE METABOLIC PANEL WITH GFR  URINE DRUG SCREEN  CBG MONITORING, ED    EKG: EKG Interpretation Date/Time:  Friday June 06 2024 01:29:42 EDT Ventricular Rate:  65 PR Interval:  282 QRS Duration:  98 QT Interval:  403 QTC Calculation: 419 R Axis:   82  Text Interpretation: Sinus rhythm Prolonged PR interval No significant change was found Confirmed by Carita Senior [phone]) on 06/06/2024 1:38:04 AM  Radiology: CT ANGIO HEAD NECK W WO CM Result Date: 06/06/2024 CLINICAL DATA:  Initial evaluation for acute neuro deficit, stroke suspected. EXAM: CT ANGIOGRAPHY HEAD AND NECK WITH AND WITHOUT CONTRAST TECHNIQUE: Multidetector CT imaging of the head and neck was performed using the standard protocol during bolus administration of intravenous contrast. Multiplanar CT image reconstructions and MIPs were obtained to evaluate the vascular anatomy. Carotid stenosis measurements (when applicable) are obtained utilizing NASCET criteria, using the distal internal carotid diameter as the denominator. RADIATION DOSE REDUCTION: This exam was performed according to the departmental dose-optimization program which includes automated exposure control, adjustment of the mA and/or kV according to patient size and/or use of iterative reconstruction technique. CONTRAST:  75mL OMNIPAQUE  IOHEXOL  350 MG/ML SOLN COMPARISON:  CT from earlier the same day. FINDINGS: CTA NECK FINDINGS Aortic arch: Standard branching. Imaged portion shows no  evidence of aneurysm or dissection. No significant stenosis of the major arch vessel origins. Right carotid system: No evidence of dissection, stenosis (50% or greater), or occlusion. Left carotid system: No evidence of dissection, stenosis (50% or greater), or occlusion. Vertebral arteries: Both vertebral arteries are diminutive but patent without stenosis or dissection. Skeleton: No worrisome osseous lesions. Moderate to advanced multilevel cervical spondylosis with bulky anterior osteophytic spurring. Other neck: No other acute finding. Upper chest: No other acute finding. Review of the MIP images confirms the above findings CTA HEAD FINDINGS Anterior circulation: Evaluation of the intracranial circulation somewhat limited by timing the contrast bolus. Both internal carotid arteries are patent through the siphons without visible stenosis or other abnormality. A1 segments patent bilaterally. Normal anterior communicating complex. Anterior cerebral arteries patent without visible stenosis. No M1 stenosis or occlusion. No proximal MCA branch occlusion. Distal MCA branches perfused and symmetric.  Posterior circulation: Left V4 segment dominant and patent without stenosis. Left PICA not seen. Right vertebral artery largely terminates in PICA, although a tiny branch is seen ascending towards the vertebrobasilar junction. Right PICA patent at its origin. Basilar diminutive but patent without stenosis. Superior cerebral arteries patent bilaterally. Predominant fetal type origin of the PCAs bilaterally. Both PCAs patent to their distal aspects without significant stenosis. Venous sinuses: Patent allowing for timing the contrast bolus. Anatomic variants: As above.  No visible aneurysm. Review of the MIP images confirms the above findings IMPRESSION: Negative CTA of the head and neck. No large vessel occlusion or other emergent finding. No hemodynamically significant or correctable stenosis. Electronically Signed   By:  Morene Hoard M.D.   On: 06/06/2024 03:11   CT HEAD CODE STROKE WO CONTRAST Result Date: 06/06/2024 CLINICAL DATA:  Code stroke. Initial evaluation for acute neuro deficit, stroke suspected. EXAM: CT HEAD WITHOUT CONTRAST TECHNIQUE: Contiguous axial images were obtained from the base of the skull through the vertex without intravenous contrast. RADIATION DOSE REDUCTION: This exam was performed according to the departmental dose-optimization program which includes automated exposure control, adjustment of the mA and/or kV according to patient size and/or use of iterative reconstruction technique. COMPARISON:  Prior study from 01/21/2024 FINDINGS: Brain: Cerebral volume within normal limits for patient age. No acute intracranial hemorrhage. No acute large vessel territory infarct. No mass lesion, midline shift, or mass effect. Ventricles are normal in size without hydrocephalus. No extra-axial fluid collection. Vascular: No abnormal hyperdense vessel. Skull: Scalp soft tissues demonstrate no acute abnormality. Calvarium intact. Sinuses/Orbits: Globes and orbital soft tissues within normal limits. Visualized paranasal sinuses are largely clear. No significant mastoid effusion. ASPECTS [facility] Stroke Program Early CT Score) - Ganglionic level infarction (caudate, lentiform nuclei, internal capsule, insula, M1-M3 cortex): 7 - Supraganglionic infarction (M4-M6 cortex): 3 Total score (0-10 with 10 being normal): 10 IMPRESSION: 1. Negative head CT.  No acute intracranial abnormality. 2. ASPECTS is 10. Results were called by telephone at the time of interpretation on 06/06/2024 at 1:30 am to provider [facility] , who verbally acknowledged these results. Electronically Signed   By: Morene Hoard M.D.   On: 06/06/2024 01:30     .Critical Care  Performed by: Carita Senior, MD Authorized by: Carita Senior, MD   Critical care provider statement:    Critical care time (minutes):  35   Critical  care time was exclusive of:  Separately billable procedures and treating other patients   Critical care was necessary to treat or prevent imminent or life-threatening deterioration of the following conditions:  CNS failure or compromise   Critical care was time spent personally by me on the following activities:  Development of treatment plan with patient or surrogate, discussions with consultants, evaluation of patient's response to treatment, examination of patient, ordering and review of laboratory studies, ordering and review of radiographic studies, ordering and performing treatments and interventions, pulse oximetry, re-evaluation of patient's condition, review of old charts, blood draw for specimens and obtaining history from patient or surrogate   I assumed direction of critical care for this patient from another provider in my specialty: no     Care discussed with: admitting provider      Medications Ordered in the ED - No data to display                                  Medical Decision  Making Amount and/or Complexity of Data Reviewed Labs: ordered. Decision-making details documented in ED Course. Radiology: ordered and independent interpretation performed. Decision-making details documented in ED Course. ECG/medicine tests: ordered and independent interpretation performed. Decision-making details documented in ED Course.  Risk OTC drugs. Prescription drug management. Decision regarding hospitalization.   Paresthesias to left face and arm that started about 40 minutes ago.  No motor deficits visualized.  No chest pain currently.  Code stroke activated on arrival.  Likely not TNK candidate due to minimal deficits.  No weakness. CT head negative for hemorrhage.  Discussed with Dr. Nicholes.  Seen by neurology Dr. Zamani.  He agrees not TNK candidate due to minimal deficits, NIH 1. Deficits are nondisabling. Recommends admission to the hospital for stroke workup including MRI,  CTA head and neck, echocardiogram and neurochecks.  Will load with aspirin  and Plavix   Recommends permissive hypertension up to 220 systolic.  Aspirin  and Plavix  doses ordered.  Labs reassuring.  CTA negative for large vessel occlusion.  Admission for stroke workup discussed with Dr. Lawence.     Final diagnoses:  None    ED Discharge Orders     None          Daimen Shovlin, [place], MD 06/06/24 [phone]

## 2024-06-06 NOTE — H&P (Addendum)
 History and Physical   HUTSON LUFT FMW:[id number] DOB: 1966/07/20 DOA: 06/06/2024  PCP: Pcp, No   Patient coming from: Home  Chief Complaint: Numbness  HPI: [name] is a 58 y.o. male with medical history significant of DVT, gastric bypass surgery, anemia presenting with numbness.  Around 12:45 AM last night patient developed numbness of his left face and arm.  For the past few weeks he has had some intermittent palpitations at night that last for few minutes.  He was having palpitations when his symptoms began last night.  Facial numbness persists. Does have history of spine issues and had been scheduled for MRI C-spine soon with concern for nerve impingement.   Denies fevers, chills, chest pain or shortness of breath, abdominal pain, constipation, diarrhea, nausea, vomiting.    ED Course: Vital signs in the ED notable for heart rate in the 50s and 70s.  Lab workup included CMP within normal limits.  CBC with hemoglobin stable at 1.4.  PT, PTT, INR within normal limits.  Ethanol negative.  UDS negative.  CT head showed acute abnormality.  CTA head neck showed no acute abnormality.  Teleneurology was consulted and recommended aspirin  and Plavix  and stroke workup.  Review of Systems: As per HPI otherwise all other systems reviewed and are negative.  Past Medical History:  Diagnosis Date   Back pain    compensating from hip pain   History of colon polyps    Joint pain    Osteoarthritis    knees 10/22/2014)    Past Surgical History:  Procedure Laterality Date   BREATH TEK H PYLORI  02/14/2012   Procedure: BREATH TEK H PYLORI;  Surgeon: Alm VEAR Angle, MD;  Location: [facility];  Service: General;  Laterality: N/A;   COLONOSCOPY     ESOPHAGOGASTRODUODENOSCOPY     FRACTURE SURGERY     GASTRIC ROUX-EN-Y  06/17/2012   Procedure: LAPAROSCOPIC ROUX-EN-Y GASTRIC;  Surgeon: Alm VEAR Angle, MD;  Location: [facility];  Service: General;  Laterality: N/A;   JOINT REPLACEMENT      KNEE HARDWARE REMOVAL Left 1986   took pin out   PATELLA FRACTURE SURGERY Left 1985   pin placed   TONSILLECTOMY AND ADENOIDECTOMY  ~ 1973   TOTAL HIP ARTHROPLASTY Right 10/21/2014   Procedure: TOTAL HIP ARTHROPLASTY;  Surgeon: Dempsey JINNY Sensor, MD;  Location: [facility];  Service: Orthopedics;  Laterality: Right;   TOTAL KNEE ARTHROPLASTY Left 09/12/2013   Procedure: TOTAL KNEE ARTHROPLASTY;  Surgeon: Dempsey JINNY Sensor, MD;  Location: [facility];  Service: Orthopedics;  Laterality: Left;   WRIST FRACTURE SURGERY Left ~ 1975    Social History  reports that he has never smoked. He has never used smokeless tobacco. He reports that he does not drink alcohol and does not use drugs.  No Known Allergies  Family History  Problem Relation Age of Onset   Diabetes Mother 30   Stomach cancer Mother 22   Hypertension Mother    Colon cancer Mother    Lung cancer Father 5   Hypertension Father    Alcohol abuse Father    Esophageal cancer Neg Hx    Rectal cancer Neg Hx   Reviewed on admission  Prior to Admission medications   Medication Sig Start Date End Date Taking? Authorizing Provider  Cholecalciferol (VITAMIN D) 125 MCG (5000 UT) CAPS Take 1 capsule by mouth daily.   Yes [provider]  ibuprofen (ADVIL) 800 MG tablet Take 800 mg by mouth every  8 (eight) hours as needed for mild pain (pain score 1-3) or moderate pain (pain score 4-6). 05/27/24  Yes [provider]  Multiple Vitamin (MULTIVITAMIN) tablet Take 1 tablet by mouth daily.   Yes [provider]  silver  sulfADIAZINE  (SILVADENE ) 1 % cream Apply 1 Application topically 2 (two) times daily. Patient taking differently: Apply 1 Application topically daily. 05/23/24  Yes Christopher Savannah, PA-C  tadalafil (CIALIS) 5 MG tablet Take 5 mg by mouth daily as needed for erectile dysfunction. 02/05/24  Yes [provider]  tiZANidine (ZANAFLEX) 2 MG tablet Take 2 mg by mouth every 6 (six) hours as needed for muscle spasms.  09/07/22  Yes [provider]    Physical Exam: Vitals:   06/06/24 1300 06/06/24 1359 06/06/24 1503 06/06/24 1510  BP: 117/83  122/71   Pulse: 71  65   Resp: 19     Temp:  98.4 F (36.9 C) 98.2 F (36.8 C)   TempSrc:   Oral   SpO2: 100%  99%   Weight:    108 kg  Height:    5' 9.5 (1.765 m)    Physical Exam Constitutional:      General: He is not in acute distress.    Appearance: Normal appearance.  HENT:     Head: Normocephalic and atraumatic.     Mouth/Throat:     Mouth: Mucous membranes are moist.     Pharynx: Oropharynx is clear.  Eyes:     Extraocular Movements: Extraocular movements intact.     Pupils: Pupils are equal, round, and reactive to light.  Cardiovascular:     Rate and Rhythm: Normal rate and regular rhythm.     Pulses: Normal pulses.     Heart sounds: Normal heart sounds.  Pulmonary:     Effort: Pulmonary effort is normal. No respiratory distress.     Breath sounds: Normal breath sounds.  Abdominal:     General: Bowel sounds are normal. There is no distension.     Palpations: Abdomen is soft.     Tenderness: There is no abdominal tenderness.  Musculoskeletal:        General: No swelling or deformity.  Skin:    General: Skin is warm and dry.  Neurological:     Comments: Mental Status: Patient is awake, alert, oriented x3 No signs of aphasia or neglect Cranial Nerves: II: Pupils equal, round, and reactive to light.   III,IV, VI: EOMI without ptosis or diploplia.  V: Facial sensation is diminished on left VII: Facial movement is symmetric.  VIII: hearing is intact to voice X: Uvula elevates symmetrically XI: Shoulder shrug is symmetric. XII: tongue is midline without atrophy or fasciculations.  Motor: Good effort thorughout, at Least 5/5 bilateral UE, 5/5 bilateral lower extremitiy  Sensory: Sensation is grossly intact bilateral UEs & LEs Cerebellar: Finger-Nose intact bilalat    Labs on Admission: I have personally reviewed  following labs and imaging studies  CBC: Recent Labs  Lab 06/06/24 0129  WBC 5.7  NEUTROABS 2.9  HGB 11.4*  HCT 35.9*  MCV 83.3  PLT 216    Basic Metabolic Panel: Recent Labs  Lab 06/06/24 0129  NA 139  K 4.0  CL 104  CO2 23  GLUCOSE 92  BUN 19  CREATININE 1.12  CALCIUM  9.4    GFR: Estimated Creatinine Clearance: 89.9 mL/min (by C-G formula based on SCr of 1.12 mg/dL).  Liver Function Tests: Recent Labs  Lab 06/06/24 0129  AST 23  ALT  19  ALKPHOS 94  BILITOT 0.4  PROT 6.9  ALBUMIN 4.3    Urine analysis:    Component Value Date/Time   COLORURINE YELLOW 10/15/2014 0836   APPEARANCEUR CLEAR 10/15/2014 0836   LABSPEC 1.029 10/15/2014 0836   PHURINE 5.0 10/15/2014 0836   GLUCOSEU NEGATIVE 10/15/2014 0836   GLUCOSEU NEGATIVE 04/22/2013 0922   HGBUR NEGATIVE 10/15/2014 0836   BILIRUBINUR NEGATIVE 10/15/2014 0836   KETONESUR NEGATIVE 10/15/2014 0836   PROTEINUR NEGATIVE 10/15/2014 0836   UROBILINOGEN 0.2 10/15/2014 0836   NITRITE NEGATIVE 10/15/2014 0836   LEUKOCYTESUR NEGATIVE 10/15/2014 0836    Radiological Exams on Admission: CT ANGIO HEAD NECK W WO CM Result Date: 06/06/2024 CLINICAL DATA:  Initial evaluation for acute neuro deficit, stroke suspected. EXAM: CT ANGIOGRAPHY HEAD AND NECK WITH AND WITHOUT CONTRAST TECHNIQUE: Multidetector CT imaging of the head and neck was performed using the standard protocol during bolus administration of intravenous contrast. Multiplanar CT image reconstructions and MIPs were obtained to evaluate the vascular anatomy. Carotid stenosis measurements (when applicable) are obtained utilizing NASCET criteria, using the distal internal carotid diameter as the denominator. RADIATION DOSE REDUCTION: This exam was performed according to the departmental dose-optimization program which includes automated exposure control, adjustment of the mA and/or kV according to patient size and/or use of iterative reconstruction technique.  CONTRAST:  75mL OMNIPAQUE  IOHEXOL  350 MG/ML SOLN COMPARISON:  CT from earlier the same day. FINDINGS: CTA NECK FINDINGS Aortic arch: Standard branching. Imaged portion shows no evidence of aneurysm or dissection. No significant stenosis of the major arch vessel origins. Right carotid system: No evidence of dissection, stenosis (50% or greater), or occlusion. Left carotid system: No evidence of dissection, stenosis (50% or greater), or occlusion. Vertebral arteries: Both vertebral arteries are diminutive but patent without stenosis or dissection. Skeleton: No worrisome osseous lesions. Moderate to advanced multilevel cervical spondylosis with bulky anterior osteophytic spurring. Other neck: No other acute finding. Upper chest: No other acute finding. Review of the MIP images confirms the above findings CTA HEAD FINDINGS Anterior circulation: Evaluation of the intracranial circulation somewhat limited by timing the contrast bolus. Both internal carotid arteries are patent through the siphons without visible stenosis or other abnormality. A1 segments patent bilaterally. Normal anterior communicating complex. Anterior cerebral arteries patent without visible stenosis. No M1 stenosis or occlusion. No proximal MCA branch occlusion. Distal MCA branches perfused and symmetric. Posterior circulation: Left V4 segment dominant and patent without stenosis. Left PICA not seen. Right vertebral artery largely terminates in PICA, although a tiny branch is seen ascending towards the vertebrobasilar junction. Right PICA patent at its origin. Basilar diminutive but patent without stenosis. Superior cerebral arteries patent bilaterally. Predominant fetal type origin of the PCAs bilaterally. Both PCAs patent to their distal aspects without significant stenosis. Venous sinuses: Patent allowing for timing the contrast bolus. Anatomic variants: As above.  No visible aneurysm. Review of the MIP images confirms the above findings IMPRESSION:  Negative CTA of the head and neck. No large vessel occlusion or other emergent finding. No hemodynamically significant or correctable stenosis. Electronically Signed   By: Morene Hoard M.D.   On: 06/06/2024 03:11   CT HEAD CODE STROKE WO CONTRAST Result Date: 06/06/2024 CLINICAL DATA:  Code stroke. Initial evaluation for acute neuro deficit, stroke suspected. EXAM: CT HEAD WITHOUT CONTRAST TECHNIQUE: Contiguous axial images were obtained from the base of the skull through the vertex without intravenous contrast. RADIATION DOSE REDUCTION: This exam was performed according to the departmental dose-optimization program which  includes automated exposure control, adjustment of the mA and/or kV according to patient size and/or use of iterative reconstruction technique. COMPARISON:  Prior study from 01/21/2024 FINDINGS: Brain: Cerebral volume within normal limits for patient age. No acute intracranial hemorrhage. No acute large vessel territory infarct. No mass lesion, midline shift, or mass effect. Ventricles are normal in size without hydrocephalus. No extra-axial fluid collection. Vascular: No abnormal hyperdense vessel. Skull: Scalp soft tissues demonstrate no acute abnormality. Calvarium intact. Sinuses/Orbits: Globes and orbital soft tissues within normal limits. Visualized paranasal sinuses are largely clear. No significant mastoid effusion. ASPECTS [facility] Stroke Program Early CT Score) - Ganglionic level infarction (caudate, lentiform nuclei, internal capsule, insula, M1-M3 cortex): 7 - Supraganglionic infarction (M4-M6 cortex): 3 Total score (0-10 with 10 being normal): 10 IMPRESSION: 1. Negative head CT.  No acute intracranial abnormality. 2. ASPECTS is 10. Results were called by telephone at the time of interpretation on 06/06/2024 at 1:30 am to provider [facility] , who verbally acknowledged these results. Electronically Signed   By: Morene Hoard M.D.   On: 06/06/2024 01:30   EKG:  Independently reviewed.  Sinus rhythm at 65 bpm.  Nonspecific T wave changes.  Minimal baseline artifact.  First-degree AV block.  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   Peroneal DVT (deep venous thrombosis) (HCC)   Numbness Presumed CVA > Presenting with new onset facial and arm numbness overnight.  No other deficits noted.  Occurred with palpitations. > Sinus rhythm thus far.  CT head and CTA head neck showed no acute normality. > Teleneurology was consulted at the ED overnight and recommended aspirin  and Plavix  and stroke workup. > History of spine disease plan for outpatient MRI per patient and family, nor records available at this time. - Will consult in-house neurology - Allow for permissive HTN (systolic < 220 and diastolic < 120)  - Continue with aspirin  and Plavix  for now - Statin, depending on results of lipid panel - Echocardiogram  - MRI brain - A1C  - Lipid panel  - Tele monitoring  - SLP eval - PT/OT  History of DVT History of gastric bypass - Noted  DVT prophylaxis: Lovenox  Code Status:   Full Family Communication:  Updated at bedside  Disposition Plan:   Patient is from:  Home  Anticipated DC to:  Home  Anticipated DC date:  1 to 2 days  Anticipated DC barriers: None  Consults called:  Neurology Admission status:  Observation, telemetry  Severity of Illness: The appropriate patient status for this patient is OBSERVATION. Observation status is judged to be reasonable and necessary in order to provide the required intensity of service to ensure the patient's safety. The patient's presenting symptoms, physical exam findings, and initial radiographic and laboratory data in the context of their medical condition is felt to place them at decreased risk for further clinical deterioration. Furthermore, it is anticipated that the patient will be medically stable for discharge from the  hospital within 2 midnights of admission.    Marsa KATHEE Scurry MD [facility]  How to contact the [facility] Attending or Consulting provider 7A - 7P or covering provider during after hours 7P -7A, for this patient?   Check the care team in [facility] and look for a) attending/consulting [facility] provider listed and b) the [facility] team listed Log into [email or web address] and use 's universal password to access. If you do not have the password, please contact the hospital operator.  Locate the [facility] provider you are looking for under [facility] and page to a number that you can be directly reached. If you still have difficulty reaching the provider, please page the [facility] (Director on Call) for the Hospitalists listed on amion for assistance.  06/06/2024, 6:18 PM

## 2024-06-06 NOTE — ED Notes (Signed)
 Introduced self to patient, did neuro exam. His sensation is normal at this time. He reports it comes and goes.

## 2024-06-06 NOTE — ED Notes (Signed)
 Called [facility] to transport patient to [facility] rm# [id number]

## 2024-06-06 NOTE — ED Notes (Signed)
 Telestroke MD responded

## 2024-06-06 NOTE — ED Triage Notes (Signed)
 Pt POV reporting L arm, face and leg numbness that began around midnight. No weakness, slurred speech, blurred vision, or facial droop noted. Rancour MD to triage to evaluate pt.

## 2024-06-06 NOTE — Consult Note (Signed)
 [facility]rology Consult Services   Patient Name:   Anthony, [name] Date of Birth:   1966/01/11 Identification Number:   MRN - [id number] Date of Service:   06/06/2024 01:15:55  Diagnosis:       R20.2 - Paresthesia of skin  Impression:      Acute-onset left-hemiparesthesia:    TIA v Acute Ischemic Stroke     Admit to neurology floor for stroke work-up including lab works (CBC, CMP, TSH, UA, fasting lipid profile, and HbA1C),MRI brain stroke protocol (can order w/w/o),  Vessel imaging CTA/MRA head and neck w/o to evaluate extra/intracranial vasculature integrity  2D echo to check Ejection fraction, wall motion defects, intracardiac thrombi  Q4hour Neurochecks  Monitor 48 hours under telemetry for arrhythmia  Allow for permissive hypertension and hold all antihypertensives unless BP exceeds 220 mmHg systolic or 120 mmHg diastolic for which will treat with labetalol  pushes  IVF 0.9NS @100  cc/hr (if needed)  Start aspirin  and statin if no contraindication for secondary stroke ppx  ASA load 325mg  po or 300 per rectal if failed SLP and 81mg  Qday there after.   - Start Plavix  300mg  load followed by 75 mg Qday for 21 days unless ICAD present on vessel imaging. If so, extend DAPT to 90 days   - Replete any deficiencies as needed and provide appropriate supplementation recommendations.  PT/OT/SLP/PM&R evaluation  If failed swallow, insert NGT and start feeds w/i 48 hrs.  Maintain euthermia, euvolemia, euglycemia.     Plan to de-escalate medications once work-up is complete  Our recommendations are outlined below.  Recommendations:        Stroke/Telemetry Floor       Neuro Checks (Q4)       Bedside Swallow Eval       DVT Prophylaxis       IV Fluids, Normal Saline       Head of Bed 30 Degrees       Euglycemia and Avoid Hyperthermia (PRN Acetaminophen )  Sign Out:       Discussed with Emergency Department  Provider    ------------------------------------------------------------------------------  Advanced Imaging: Advanced Imaging Deferred because:  Non-disabling symptoms as verified by the patient; no cortical signs so not consistent with LVO   Metrics: Last Known Well: 06/06/2024 00:35:00 Dispatch Time: 06/06/2024 01:15:55 Arrival Time: 06/06/2024 01:01:00 Initial Response Time: 06/06/2024 01:29:42 Symptoms: left sided numbness. Initial patient interaction: 06/06/2024 01:37:00 NIHSS Assessment Completed: 06/06/2024 01:40:53 Patient is not a candidate for Thrombolytic. Thrombolytic Medical Decision: 06/06/2024 01:42:08 Patient was not deemed candidate for Thrombolytic because of following reasons: Stroke severity too mild (non-disabling) .  CT Head: I personally reviewed all the CT images that were available to me and it showed: No acute findings  Primary Provider Notified of Diagnostic Impression and Management Plan on: 06/06/2024 02:09:06    ------------------------------------------------------------------------------  History of Present Illness: Patient is a 58 year old Male.  Patient was brought by private transportation with symptoms of left sided numbness. Patient is a 58 year old male presents with left-sided facial, arm, and leg numbness that began approximately 40 minutes prior to evaluation at around 12:35 AM. The patient reports feeling the numbness primarily localized to the left side of his face, left arm, and to a lesser extent, his left leg. He denies any associated weakness, slurred speech, or blurred vision and is able to walk without difficulty. The patient does not report any aggravating or alleviating factors, radiation of symptoms, or changes in severity since onset.  The patient has  a medical history that includes multiple hip replacements. He is currently taking tizanidine as a muscle relaxer. The patient denies any history of heart issues, diabetes,  high blood pressure, or high cholesterol and is not currently taking aspirin  or any antiplatelet medications.  Upon review of systems, the patient denies weakness, slurred speech, or dizziness. He denies any blurred vision or shortness of breath.  As of note, the patient denies any history of heart issues, diabetes, high blood pressure, or high cholesterol. His current medications include tizanidine as a muscle relaxer.  Review of Systems HEENT: Negative for blurred vision. Neurological: Positive for left-sided face, arm, and leg numbness. Negative for slurred speech or dizziness. Cardiovascular: Negative for weakness. Respiratory: Negative for shortness of breath.   Past Medical History:      There is no history of Hypertension      There is no history of Diabetes Mellitus      There is no history of Hyperlipidemia  Medications:  No Anticoagulant use  No Antiplatelet use Reviewed EMR for current medications  Allergies:  Reviewed  Social History: Drug Use: No  Family History:  There is no family history of premature cerebrovascular disease pertinent to this consultation  ROS : 14 Points Review of Systems was performed and was negative except mentioned in HPI.  Past Surgical History: There Is No Surgical History Contributory To Today's Visit    Examination: BP(134/81), Pulse(64), 1A: Level of Consciousness - Alert; keenly responsive + 0 1B: Ask Month and Age - Both Questions Right + 0 1C: Blink Eyes & Squeeze Hands - Performs Both Tasks + 0 2: Test Horizontal Extraocular Movements - Normal + 0 3: Test Visual Fields - No Visual Loss + 0 4: Test Facial Palsy (Use Grimace if Obtunded) - Normal symmetry + 0 5A: Test Left Arm Motor Drift - No Drift for 10 Seconds + 0 5B: Test Right Arm Motor Drift - No Drift for 10 Seconds + 0 6A: Test Left Leg Motor Drift - No Drift for 5 Seconds + 0 6B: Test Right Leg Motor Drift - No Drift for 5 Seconds + 0 7: Test Limb Ataxia  (FNF/Heel-Shin) - No Ataxia + 0 8: Test Sensation - Mild-Moderate Loss: Less Sharp/More Dull + 1 9: Test Language/Aphasia - Normal; No aphasia + 0 10: Test Dysarthria - Normal + 0 11: Test Extinction/Inattention - No abnormality + 0  NIHSS Score: 1  NIHSS Free Text : Stable station stance and gait  Pre-Morbid Modified Rankin Scale: 0 Points = No symptoms at all  Spoke with : Dr. Carita  This consult was conducted in real time using interactive audio and Immunologist. Patient was informed of the technology being used for this visit and agreed to proceed. Patient located in hospital and provider located at home/office setting.   Patient is being evaluated for possible acute neurologic impairment and high probability of imminent or life-threatening deterioration. I spent total of 35 minutes providing care to this patient, including time for face to face visit via telemedicine, review of medical records, imaging studies and discussion of findings with providers, the patient and/or family.    Dr Shakeera Rightmyer   [facility] For Inpatient follow-up with [facility] physician please call [facility] at [phone]. As we are not an outpatient service for any post hospital discharge needs please contact the hospital for assistance. If you have any questions for the [facility] physicians or need to reconsult for clinical or diagnostic changes please contact us  via [facility] at  [phone].   Signature : Amanpreet Delmont

## 2024-06-07 ENCOUNTER — Observation Stay (HOSPITAL_BASED_OUTPATIENT_CLINIC_OR_DEPARTMENT_OTHER)

## 2024-06-07 ENCOUNTER — Other Ambulatory Visit (HOSPITAL_COMMUNITY): Payer: Self-pay | Admitting: Radiology

## 2024-06-07 ENCOUNTER — Observation Stay (HOSPITAL_COMMUNITY)

## 2024-06-07 DIAGNOSIS — R200 Anesthesia of skin: Secondary | ICD-10-CM | POA: Diagnosis not present

## 2024-06-07 DIAGNOSIS — G459 Transient cerebral ischemic attack, unspecified: Secondary | ICD-10-CM | POA: Diagnosis not present

## 2024-06-07 DIAGNOSIS — E785 Hyperlipidemia, unspecified: Secondary | ICD-10-CM

## 2024-06-07 DIAGNOSIS — I340 Nonrheumatic mitral (valve) insufficiency: Secondary | ICD-10-CM | POA: Diagnosis not present

## 2024-06-07 LAB — LIPID PANEL
Cholesterol: 135 mg/dL (ref 0–200)
HDL: 47 mg/dL (ref >40)
LDL Cholesterol: 80 mg/dL (ref 0–99)
Total CHOL/HDL Ratio: 2.9 ratio
Triglycerides: 41 mg/dL (ref <150)
VLDL: 8 mg/dL (ref 0–40)

## 2024-06-07 LAB — COMPREHENSIVE METABOLIC PANEL WITH GFR
ALT: 16 U/L (ref 0–44)
AST: 17 U/L (ref 15–41)
Albumin: 3.3 g/dL — ABNORMAL LOW (ref 3.5–5.0)
Alkaline Phosphatase: 66 U/L (ref 38–126)
Anion gap: 7 (ref 5–15)
BUN: 14 mg/dL (ref 6–20)
CO2: 22 mmol/L (ref 22–32)
Calcium: 8.6 mg/dL — ABNORMAL LOW (ref 8.9–10.3)
Chloride: 106 mmol/L (ref 98–111)
Creatinine, Ser: 0.99 mg/dL (ref 0.61–1.24)
GFR, Estimated: >60 mL/min (ref >60)
Glucose, Bld: 89 mg/dL (ref 70–99)
Potassium: 4.1 mmol/L (ref 3.5–5.1)
Sodium: 135 mmol/L (ref 135–145)
Total Bilirubin: 0.6 mg/dL (ref 0.0–1.2)
Total Protein: 6 g/dL — ABNORMAL LOW (ref 6.5–8.1)

## 2024-06-07 LAB — ECHOCARDIOGRAM COMPLETE
AR max vel: 2.95 cm2
AV Peak grad: 7 mmHg
Ao pk vel: 1.32 m/s
Area-P 1/2: 3.85 cm2
Height: 69.5 in
S' Lateral: 2.9 cm
Weight: 3808 [oz_av]

## 2024-06-07 LAB — CBC
HCT: 35.7 % — ABNORMAL LOW (ref 39.0–52.0)
Hemoglobin: 11.4 g/dL — ABNORMAL LOW (ref 13.0–17.0)
MCH: 26.6 pg (ref 26.0–34.0)
MCHC: 31.9 g/dL (ref 30.0–36.0)
MCV: 83.4 fL (ref 80.0–100.0)
Platelets: 210 K/uL (ref 150–400)
RBC: 4.28 MIL/uL (ref 4.22–5.81)
RDW: 14 % (ref 11.5–15.5)
WBC: 5.1 K/uL (ref 4.0–10.5)
nRBC: 0 % (ref 0.0–0.2)

## 2024-06-07 LAB — HEMOGLOBIN A1C
Hgb A1c MFr Bld: 5.2 % (ref 4.8–5.6)
Mean Plasma Glucose: 102.54 mg/dL

## 2024-06-07 MED ORDER — ATORVASTATIN CALCIUM 40 MG PO TABS
40.0000 mg | ORAL_TABLET | Freq: Every day | ORAL | Status: DC
  Administered 2024-06-07 – 2024-06-08 (×2): 40 mg via ORAL
  Filled 2024-06-07 (×2): qty 1

## 2024-06-07 NOTE — Progress Notes (Signed)
 PROGRESS NOTE    [name]  FMW:[id number] DOB: October 25, 1965 DOA: 06/06/2024 PCP: Pcp, No   Brief Narrative:  This 58 y.o. male with medical history significant of DVT, gastric bypass surgery, anemia presenting with numbness.  Patient has developed left face and left arm numbness around 12:45 AM last night.  Patient also reports associated intermittent palpitations.  Patient reports history of spine issues and had been scheduled for MRI C-spine soon with a concern for nerve impingement.  CT head shows no acute abnormality.  CT head and neck no acute abnormality.  Teleneurology was consulted recommended aspirin  and Plavix  and stroke workup.  Patient was admitted for further evaluation.  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   Peroneal DVT (deep venous thrombosis) (HCC)   Numbness: Presumed CVA: Patient presented with new onset left facial and arm numbness overnight.  No other deficits noted.  He also reported associated intermittent palpitations. CT head and CTA head neck showed no acute normality. Tele-neurology was consulted at the ED overnight and recommended aspirin  and Plavix  and stroke workup. History of spine disease,  plan for outpatient MRI per patient and family Allow for permissive HTN (systolic < 220 and diastolic < 120)  Continue with aspirin  and Plavix  for now Statin, depending on results of lipid panel Obtain 2D Echocardiogram  Obtain MRI brain. HbA1C  - Tele monitoring  - SLP eval - PT/OT   History of DVT: History of gastric bypass - Noted    DVT prophylaxis: Lovenox  Code Status: Full Family Communication: No family at bed side Disposition Plan:  severity of illness.   Consultants:  Neurology  Procedures: MRI Brain.  Antimicrobials: Anti-infectives (From admission, onward)    None      Subjective: Patient was seen and examined at bedside. Overnight events  noted. Patient reports feeling much better.  He reported numbness has resolved.   Objective: Vitals:   06/06/24 2008 06/07/24 0007 06/07/24 0433 06/07/24 0759  BP: 128/69 109/65 116/68 112/73  Pulse: 64 (!) 59 (!) 59 63  Resp: 20 20 20 20   Temp: 97.7 F (36.5 C) 98 F (36.7 C) 97.9 F (36.6 C) 97.7 F (36.5 C)  TempSrc: Oral Oral Oral   SpO2: 100% 98% 98% 98%  Weight:      Height:       No intake or output data in the 24 hours ending 06/07/24 1120 Filed Weights   06/06/24 1510  Weight: 108 kg   Examination:  General exam: Appears calm and comfortable, not in any acute distress. Respiratory system: Clear to auscultation. Respiratory effort normal.  RR 16 Cardiovascular system: S1 & S2 heard, RRR. No JVD, murmurs, rubs, gallops or clicks. Gastrointestinal system: Abdomen is non distended, soft and non tender. No organomegaly or masses felt. Normal bowel sounds heard. Central nervous system: Alert and oriented x 3. No focal neurological deficits. Extremities: No edema, no cyanosis, no clubbing. Skin: No rashes, lesions or ulcers Psychiatry: Judgement and insight appear normal. Mood & affect appropriate.     Data Reviewed: I have personally reviewed following labs and imaging studies  CBC: Recent Labs  Lab 06/06/24 0129 06/07/24 0358  WBC 5.7 5.1  NEUTROABS 2.9  --   HGB 11.4* 11.4*  HCT 35.9* 35.7*  MCV 83.3 83.4  PLT 216 210   Basic Metabolic Panel: Recent Labs  Lab 06/06/24 0129 06/07/24 0358  NA 139 135  K 4.0 4.1  CL 104 106  CO2 23 22  GLUCOSE 92 89  BUN 19 14  CREATININE 1.12 0.99  CALCIUM  9.4 8.6*   GFR: Estimated Creatinine Clearance: 101.7 mL/min (by C-G formula based on SCr of 0.99 mg/dL). Liver Function Tests: Recent Labs  Lab 06/06/24 0129 06/07/24 0358  AST 23 17  ALT 19 16  ALKPHOS 94 66  BILITOT 0.4 0.6  PROT 6.9 6.0*  ALBUMIN 4.3 3.3*   No results for input(s): LIPASE, AMYLASE in the last 168 hours. No results for  input(s): AMMONIA in the last 168 hours. Coagulation Profile: Recent Labs  Lab 06/06/24 0129  INR 1.0   Cardiac Enzymes: No results for input(s): CKTOTAL, CKMB, CKMBINDEX, TROPONINI in the last 168 hours. BNP (last 3 results) No results for input(s): PROBNP in the last 8760 hours. HbA1C: Recent Labs    06/07/24 0358  HGBA1C 5.2   CBG: Recent Labs  Lab 06/06/24 0111  GLUCAP 81   Lipid Profile: Recent Labs    06/07/24 0358  CHOL 135  HDL 47  LDLCALC 80  TRIG 41  CHOLHDL 2.9   Thyroid  Function Tests: No results for input(s): TSH, T4TOTAL, FREET4, T3FREE, THYROIDAB in the last 72 hours. Anemia Panel: No results for input(s): VITAMINB12, FOLATE, FERRITIN, TIBC, IRON, RETICCTPCT in the last 72 hours. Sepsis Labs: No results for input(s): PROCALCITON, LATICACIDVEN in the last 168 hours.  No results found for this or any previous visit (from the past 240 hours).   Radiology Studies: MR BRAIN WO CONTRAST Result Date: 06/07/2024 EXAM: MRI BRAIN WITHOUT CONTRAST 06/07/2024 08:52:35 AM TECHNIQUE: Multiplanar multisequence MRI of the head/brain was performed without the administration of intravenous contrast. COMPARISON: None available. CLINICAL HISTORY: Stroke, follow up. Numbness of the left face and arm. FINDINGS: BRAIN AND VENTRICLES: No acute infarct. No intracranial hemorrhage. No mass. No midline shift. No hydrocephalus. The sella is unremarkable. Normal flow voids. Scattered subcortical T2 hyperintensities are mildly advanced for age. ORBITS: No acute abnormality. SINUSES AND MASTOIDS: No acute abnormality. BONES AND SOFT TISSUES: Normal marrow signal. No acute soft tissue abnormality. IMPRESSION: 1. No acute intracranial abnormality. 2. Scattered subcortical T2 hyperintensities, mildly advanced for age. Electronically signed by: Lonni Necessary MD 06/07/2024 10:10 AM EDT RP Workstation: [id number]   CT ANGIO HEAD NECK W WO CM Result  Date: 06/06/2024 CLINICAL DATA:  Initial evaluation for acute neuro deficit, stroke suspected. EXAM: CT ANGIOGRAPHY HEAD AND NECK WITH AND WITHOUT CONTRAST TECHNIQUE: Multidetector CT imaging of the head and neck was performed using the standard protocol during bolus administration of intravenous contrast. Multiplanar CT image reconstructions and MIPs were obtained to evaluate the vascular anatomy. Carotid stenosis measurements (when applicable) are obtained utilizing NASCET criteria, using the distal internal carotid diameter as the denominator. RADIATION DOSE REDUCTION: This exam was performed according to the departmental dose-optimization program which includes automated exposure control, adjustment of the mA and/or kV according to patient size and/or use of iterative reconstruction technique. CONTRAST:  75mL OMNIPAQUE  IOHEXOL  350 MG/ML SOLN COMPARISON:  CT from earlier the same day. FINDINGS: CTA NECK FINDINGS Aortic arch: Standard branching. Imaged portion shows no evidence of aneurysm or dissection. No significant stenosis of the major arch vessel origins. Right carotid system: No evidence of dissection, stenosis (50% or greater), or occlusion. Left carotid system: No evidence of dissection, stenosis (50% or greater), or occlusion. Vertebral arteries: Both vertebral arteries are diminutive but patent without stenosis or dissection. Skeleton: No worrisome osseous lesions. Moderate to advanced multilevel cervical spondylosis with  bulky anterior osteophytic spurring. Other neck: No other acute finding. Upper chest: No other acute finding. Review of the MIP images confirms the above findings CTA HEAD FINDINGS Anterior circulation: Evaluation of the intracranial circulation somewhat limited by timing the contrast bolus. Both internal carotid arteries are patent through the siphons without visible stenosis or other abnormality. A1 segments patent bilaterally. Normal anterior communicating complex. Anterior cerebral  arteries patent without visible stenosis. No M1 stenosis or occlusion. No proximal MCA branch occlusion. Distal MCA branches perfused and symmetric. Posterior circulation: Left V4 segment dominant and patent without stenosis. Left PICA not seen. Right vertebral artery largely terminates in PICA, although a tiny branch is seen ascending towards the vertebrobasilar junction. Right PICA patent at its origin. Basilar diminutive but patent without stenosis. Superior cerebral arteries patent bilaterally. Predominant fetal type origin of the PCAs bilaterally. Both PCAs patent to their distal aspects without significant stenosis. Venous sinuses: Patent allowing for timing the contrast bolus. Anatomic variants: As above.  No visible aneurysm. Review of the MIP images confirms the above findings IMPRESSION: Negative CTA of the head and neck. No large vessel occlusion or other emergent finding. No hemodynamically significant or correctable stenosis. Electronically Signed   By: Morene Hoard M.D.   On: 06/06/2024 03:11   CT HEAD CODE STROKE WO CONTRAST Result Date: 06/06/2024 CLINICAL DATA:  Code stroke. Initial evaluation for acute neuro deficit, stroke suspected. EXAM: CT HEAD WITHOUT CONTRAST TECHNIQUE: Contiguous axial images were obtained from the base of the skull through the vertex without intravenous contrast. RADIATION DOSE REDUCTION: This exam was performed according to the departmental dose-optimization program which includes automated exposure control, adjustment of the mA and/or kV according to patient size and/or use of iterative reconstruction technique. COMPARISON:  Prior study from 01/21/2024 FINDINGS: Brain: Cerebral volume within normal limits for patient age. No acute intracranial hemorrhage. No acute large vessel territory infarct. No mass lesion, midline shift, or mass effect. Ventricles are normal in size without hydrocephalus. No extra-axial fluid collection. Vascular: No abnormal hyperdense  vessel. Skull: Scalp soft tissues demonstrate no acute abnormality. Calvarium intact. Sinuses/Orbits: Globes and orbital soft tissues within normal limits. Visualized paranasal sinuses are largely clear. No significant mastoid effusion. ASPECTS [facility] Stroke Program Early CT Score) - Ganglionic level infarction (caudate, lentiform nuclei, internal capsule, insula, M1-M3 cortex): 7 - Supraganglionic infarction (M4-M6 cortex): 3 Total score (0-10 with 10 being normal): 10 IMPRESSION: 1. Negative head CT.  No acute intracranial abnormality. 2. ASPECTS is 10. Results were called by telephone at the time of interpretation on 06/06/2024 at 1:30 am to provider [facility] , who verbally acknowledged these results. Electronically Signed   By: Morene Hoard M.D.   On: 06/06/2024 01:30   Scheduled Meds:  aspirin  EC  81 mg Oral Daily   atorvastatin   40 mg Oral Daily   clopidogrel   75 mg Oral Daily   enoxaparin  (LOVENOX ) injection  40 mg Subcutaneous Q24H   Continuous Infusions:  sodium chloride  40 mL/hr at 06/06/24 2003     LOS: 0 days    Time spent: 50 mins    Darcel Dawley, MD [facility]   If 7PM-7AM, please contact night-coverage

## 2024-06-07 NOTE — Progress Notes (Signed)
  Echocardiogram 2D Echocardiogram has been performed.  [name] 06/07/2024, 7:08 PM

## 2024-06-07 NOTE — Plan of Care (Signed)
   Problem: Education: Goal: Knowledge of General Education information will improve Description: Including pain rating scale, medication(s)/side effects and non-pharmacologic comfort measures Outcome: Progressing   Problem: Health Behavior/Discharge Planning: Goal: Ability to manage health-related needs will improve Outcome: Progressing   Problem: Clinical Measurements: Goal: Ability to maintain clinical measurements within normal limits will improve Outcome: Progressing Goal: Will remain free from infection Outcome: Progressing Goal: Respiratory complications will improve Outcome: Progressing Goal: Cardiovascular complication will be avoided Outcome: Progressing   Problem: Coping: Goal: Level of anxiety will decrease Outcome: Progressing

## 2024-06-07 NOTE — Evaluation (Signed)
 Occupational Therapy Evaluation and DC Summary Patient Details Name: [name] MRN: [id number] DOB: Dec 02, 1965 Today's Date: 06/07/2024   History of Present Illness   Pt is a 58 yo male presenting to [facility] on 06/06/24 with Left face and Left arm numbness. CT negative, MRI pending. PMH of DVT, gastric bypass sx, anemia.     Clinical Impressions Pt admitted for above, PTA pt reports being ind with ADLs/iADLs, attends outpatient PT for his back pain and has a 10yo son. Pt currently presenting close to functional baseline, ambulating and completing ADLs ind, no BUE/BLE sensation deficits noted at this time. Pt vision, strength, coordination WNL. He has no further acute skilled OT needs, OT signing off. Reconsult if needed.      If plan is discharge home, recommend the following:    (prn)     Functional Status Assessment   Patient has not had a recent decline in their functional status     Equipment Recommendations   None recommended by OT     Recommendations for Other Services         Precautions/Restrictions   Precautions Precautions: None (low fall risk) Recall of Precautions/Restrictions: Intact Restrictions Weight Bearing Restrictions Per Provider Order: No     Mobility Bed Mobility               General bed mobility comments: Pt OOB on arrival    Transfers                   General transfer comment: OOB on arrival      Balance Overall balance assessment: Mild deficits observed, not formally tested                                         ADL either performed or assessed with clinical judgement   ADL Overall ADL's : Independent                                             Vision Baseline Vision/History: 1 Wears glasses Ability to See in Adequate Light: 0 Adequate Patient Visual Report: No change from baseline Vision Assessment?: Yes Eye Alignment: Within Functional Limits Ocular Range of  Motion: Within Functional Limits Alignment/Gaze Preference: Within Defined Limits Tracking/Visual Pursuits: Able to track stimulus in all quads without difficulty Saccades: Within functional limits Convergence: Within functional limits Visual Fields: No apparent deficits     Perception Perception: Within Functional Limits       Praxis Praxis: WFL       Pertinent Vitals/Pain Pain Assessment Pain Assessment: No/denies pain     Extremity/Trunk Assessment Upper Extremity Assessment Upper Extremity Assessment: Overall WFL for tasks assessed   Lower Extremity Assessment Lower Extremity Assessment: Defer to PT evaluation       Communication Communication Communication: No apparent difficulties   Cognition Arousal: Alert Behavior During Therapy: WFL for tasks assessed/performed Cognition: No apparent impairments                               Following commands: Intact       Cueing  General Comments   Cueing Techniques: Verbal cues  BLE edema, pt reports it has been like this for quite some time now  Exercises     Shoulder Instructions      Home Living Family/patient expects to be discharged to:: Private residence Living Arrangements: Spouse/significant other                               Additional Comments: ind, does outpatient PT      Prior Functioning/Environment                 ADLs Comments: ind    OT Problem List:  (n/a)   OT Treatment/Interventions:        OT Goals(Current goals can be found in the care plan section)   Acute Rehab OT Goals OT Goal Formulation: All assessment and education complete, DC therapy Time For Goal Achievement: 06/21/24 Potential to Achieve Goals: Good   OT Frequency:       Co-evaluation              AM-PAC OT 6 Clicks Daily Activity     Outcome Measure Help from another person eating meals?: None Help from another person taking care of personal grooming?: None Help  from another person toileting, which includes using toliet, bedpan, or urinal?: None Help from another person bathing (including washing, rinsing, drying)?: None Help from another person to put on and taking off regular upper body clothing?: None Help from another person to put on and taking off regular lower body clothing?: None 6 Click Score: 24   End of Session Nurse Communication: Mobility status  Activity Tolerance: Patient tolerated treatment well Patient left: in bed;with call bell/phone within reach  OT Visit Diagnosis: Other symptoms and signs involving the nervous system (R29.898)                Time: 8858-8845 OT Time Calculation (min): 13 min Charges:  OT General Charges $OT Visit: 1 Visit OT Evaluation $OT Eval Low Complexity: 1 Low  06/07/2024  AB, OTR/L  Acute Rehabilitation Services  Office: [phone]   Curtistine JONETTA Das 06/07/2024, 12:14 PM

## 2024-06-07 NOTE — Consult Note (Addendum)
 NEUROLOGY CONSULT NOTE   Date of service: June 07, 2024 Patient Name: [name] MRN:  [id number] DOB:  10/31/1965 Chief Complaint: Left face and arm numbness Requesting Provider: Leotis Bogus, MD  History of Present Illness  [name] is a 58 y.o. 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  He said it initially lasted for about 30 minutes and he drove himself to the emergency room.  It has intermittently comes and goes, and at this time only has slight numbness on left side of face.  MRI is negative for acute process He states he feels like his left leg is more swollen than normal and is worried about having another DVT.  He had LLE DVT in 2014 after knee replacement, was treated with blood thinner for 6 months. No more clot since. Reported symptoms of sleep apnea, has not tested yet.   LKW: Thursday PM prior to going to bed Modified rankin score: 0-Completely asymptomatic and back to baseline post- stroke IV Thrombolysis: No mild symptoms and low NIH EVT:  No LVO  NIHSS components Score: Comment  1a Level of Conscious 0[]  1[]  2[]  3[]      1b LOC Questions 0[]  1[]  2[]       1c LOC Commands 0[]  1[]  2[]       2 Best Gaze 0[]  1[]  2[]       3 Visual 0[]  1[]  2[]  3[]      4 Facial Palsy 0[]  1[]  2[]  3[]      5a Motor Arm - left 0[]  1[]  2[]  3[]  4[]  UN[]    5b Motor Arm - Right 0[]  1[]  2[]  3[]  4[]  UN[]    6a Motor Leg - Left 0[]  1[]  2[]  3[]  4[]  UN[]    6b Motor Leg - Right 0[]  1[]  2[]  3[]  4[]  UN[]    7 Limb Ataxia 0[]  1[]  2[]  UN[]      8 Sensory 0[]  1[]  2[]  UN[]      9 Best Language 0[]  1[]  2[]  3[]      10 Dysarthria 0[]  1[]  2[]  UN[]      11 Extinct. and Inattention 0[]  1[]   2[]       TOTAL: 0      ROS   Comprehensive ROS performed and pertinent positives documented in HPI    Past History   Past Medical History:  Diagnosis Date   Back pain    compensating from hip pain   History of colon polyps    Joint pain    Osteoarthritis    knees 10/22/2014)    Past Surgical History:  Procedure Laterality Date   BREATH TEK H PYLORI  02/14/2012   Procedure: BREATH TEK H PYLORI;  Surgeon: Alm VEAR Angle, MD;  Location: [facility];  Service: General;  Laterality: N/A;   COLONOSCOPY     ESOPHAGOGASTRODUODENOSCOPY     FRACTURE SURGERY     GASTRIC ROUX-EN-Y  06/17/2012   Procedure: LAPAROSCOPIC ROUX-EN-Y GASTRIC;  Surgeon: Alm VEAR Angle, MD;  Location: [facility];  Service: General;  Laterality: N/A;   JOINT REPLACEMENT     KNEE HARDWARE REMOVAL Left 1986   took pin out   PATELLA FRACTURE SURGERY Left 1985   pin placed  TONSILLECTOMY AND ADENOIDECTOMY  ~ 1973   TOTAL HIP ARTHROPLASTY Right 10/21/2014   Procedure: TOTAL HIP ARTHROPLASTY;  Surgeon: Dempsey JINNY Sensor, MD;  Location: [facility];  Service: Orthopedics;  Laterality: Right;   TOTAL KNEE ARTHROPLASTY Left 09/12/2013   Procedure: TOTAL KNEE ARTHROPLASTY;  Surgeon: Dempsey JINNY Sensor, MD;  Location: [facility];  Service: Orthopedics;  Laterality: Left;   WRIST FRACTURE SURGERY Left ~ 1975    Family History: Family History  Problem Relation Age of Onset   Diabetes Mother 47   Stomach cancer Mother 13   Hypertension Mother    Colon cancer Mother    Lung cancer Father 17   Hypertension Father    Alcohol abuse Father    Esophageal cancer Neg Hx    Rectal cancer Neg Hx     Social History  reports that he has never smoked. He has never used smokeless tobacco. He reports that he does not drink alcohol and does not use drugs.  No Known Allergies  Medications   Current Facility-Administered Medications:    0.9 %  sodium chloride  infusion, , Intravenous, Continuous, Melvin, Alexander B, MD, Last Rate: 40 mL/hr  at 06/06/24 2003, New Bag at 06/06/24 2003   acetaminophen  (TYLENOL ) tablet 650 mg, 650 mg, Oral, Q4H PRN **OR** acetaminophen  (TYLENOL ) 160 MG/5ML solution 650 mg, 650 mg, Per Tube, Q4H PRN **OR** acetaminophen  (TYLENOL ) suppository 650 mg, 650 mg, Rectal, Q4H PRN, Melvin, Alexander B, MD   aspirin  EC tablet 81 mg, 81 mg, Oral, Daily, Melvin, Alexander B, MD, 81 mg at 06/07/24 1010   atorvastatin  (LIPITOR) tablet 40 mg, 40 mg, Oral, Daily, Jerri Pfeiffer, MD, 40 mg at 06/07/24 1010   clopidogrel  (PLAVIX ) tablet 75 mg, 75 mg, Oral, Daily, Melvin, Alexander B, MD, 75 mg at 06/07/24 1010   enoxaparin  (LOVENOX ) injection 40 mg, 40 mg, Subcutaneous, Q24H, Melvin, Alexander B, MD, 40 mg at 06/06/24 2009   senna-docusate (Senokot-S) tablet 1 tablet, 1 tablet, Oral, QHS PRN, Seena Marsa NOVAK, MD  Vitals   Vitals:   06/07/24 0007 06/07/24 0433 06/07/24 0759 06/07/24 1255  BP: 109/65 116/68 112/73 126/84  Pulse: (!) 59 (!) 59 63 65  Resp: 20 20 20 20   Temp: 98 F (36.7 C) 97.9 F (36.6 C) 97.7 F (36.5 C) 98.2 F (36.8 C)  TempSrc: Oral Oral    SpO2: 98% 98% 98% 100%  Weight:      Height:        Body mass index is 34.64 kg/m.   Physical Exam   Constitutional: Appears well-developed and well-nourished.   Psych: Affect appropriate to situation.   Eyes: No scleral injection.   HENT: No OP obstruction.   Head: Normocephalic.   Cardiovascular: Normal rate and regular rhythm.   Respiratory: Effort normal, non-labored breathing.   GI: Soft.  No distension. There is no tenderness.   Skin: WDI.    Neurologic Examination    Mental Status -  Level of arousal and orientation to time, place, and person were intact . Language including expression, naming, repetition, comprehension was assessed and found intact . Attention span and concentration were normal . Recent and remote memory were intact . Fund of Knowledge was assessed and was intact .  Cranial Nerves II - XII - II - Visual field  intact OU . III, IV, VI - Extraocular movements intact . V - subjectively decreased on left face  VII - Facial movement intact bilaterally . VIII - Hearing & vestibular intact bilaterally .  X - Palate elevates symmetrically . XI - Chin turning & shoulder shrug intact bilaterally . XII - Tongue protrusion intact .  Motor Strength - The patient's strength was normal in all extremities and pronator drift was absent.  Bulk was normal and fasciculations were absent .   Motor Tone - Muscle tone was assessed at the neck and appendages and was normal  Sensory - Light touch, temperature/pinprick were assessed and were symmetrical.   Coordination - The patient had normal movements in the hands and feet with no ataxia or dysmetria.  Tremor was absent. Gait and Station - deferred.  Labs/Imaging/Neurodiagnostic studies   CBC:  Recent Labs  Lab 06-11-2024 0129 06/07/24 0358  WBC 5.7 5.1  NEUTROABS 2.9  --   HGB 11.4* 11.4*  HCT 35.9* 35.7*  MCV 83.3 83.4  PLT 216 210   Basic Metabolic Panel:  Lab Results  Component Value Date   NA 135 06/07/2024   K 4.1 06/07/2024   CO2 22 06/07/2024   GLUCOSE 89 06/07/2024   BUN 14 06/07/2024   CREATININE 0.99 06/07/2024   CALCIUM  8.6 (L) 06/07/2024   GFRNONAA >60 06/07/2024   GFRAA >90 10/22/2014   Lipid Panel:  Lab Results  Component Value Date   LDLCALC 80 06/07/2024   HgbA1c:  Lab Results  Component Value Date   HGBA1C 5.2 06/07/2024   Urine Drug Screen:     Component Value Date/Time   LABOPIA NONE DETECTED 06-11-24 1342   COCAINSCRNUR NONE DETECTED Jun 11, 2024 1342   LABBENZ NONE DETECTED 11-Jun-2024 1342   AMPHETMU NONE DETECTED 2024-06-11 1342   THCU NONE DETECTED Jun 11, 2024 1342   LABBARB NONE DETECTED 11-Jun-2024 1342    Alcohol Level     Component Value Date/Time   ETH <15 11-Jun-2024 0129   INR  Lab Results  Component Value Date   INR 1.0 11-Jun-2024   APTT  Lab Results  Component Value Date   APTT 31 06-11-2024    AED levels: No results found for: PHENYTOIN, ZONISAMIDE, LAMOTRIGINE, LEVETIRACETA  CT Head without contrast(Personally reviewed): No acute process aspects 10  CT angio Head and Neck with contrast(Personally reviewed): No LVO  MRI Brain(Personally reviewed): No acute process  2D echo is ordered A1c 5.2 LDL 80  ASSESSMENT   DONNA SILVERMAN is a 58 y.o. male with past medical history of DVT, gastric bypass surgery, anemia who presents with numbness on the left side of his body that initially resolved after 30 minutes but now has intermittently been coming and going. MRI negative, concerning for TIA given risk factors of HLD and possible OSA.   RECOMMENDATIONS   - Frequent neuro checks - Echocardiogram - Prophylactic therapy-Antiplatelet med: Aspirin  - dose 81mg  and plavix  75mg  daily for 21 days then aspirin  alone for monotherapy - Statin - Risk factor modification - Telemetry monitoring - PT consult, OT consult, Speech consult - follow up at [facility] for TIA and possible OSA ______________________________________________________________________    Signed, Karna DELENA Geralds, NP [facility]  ATTENDING NOTE: I reviewed above note and agree with the assessment and plan. Pt was seen and examined.   Wife at the bedside. Pt lying in bed, doing well. Stated that he woke up yesterday with left face and arm numbness. He had chronic b/l leg numbness due to b/l knee and right hip replacement before. He had L knee replacement in 2014 with subsequent LLE DVT and was treated with blood thinner for 6 months. He has chronic LE swelling. Yesterday left  face and arm numbness tingling lasted 30 min and resolved but throughout yesterday  his numbness came and gone, but no more episodes today. CT, CTA head and neck and MRI negative so far. His LDL 80, endorsed OSA symptoms at night, likely to be the risk factors. Now on DAPT for 3 weeks and then ASA alone. Put on lipitor 40 and continue  on discharge. Will follow up at [facility] for TIA and possible OSA.   For detailed assessment and plan, please refer to above as I have made changes wherever appropriate.   Neurology will sign off. Please call with questions. Pt will follow up with at [facility] in about 4 weeks. Thanks for the consult.  Ary Cummins, MD PhD Stroke Neurology 06/07/2024 2:09 PM

## 2024-06-07 NOTE — Progress Notes (Signed)
 SLP Cancellation Note  Patient Details Name: [name] MRN: [id number] DOB: Sep 23, 1966   Cancelled treatment:       Reason Eval/Treat Not Completed: SLP screened, no needs identified, will sign off   Pat Donne Baley,M.S.,CCC-SLP 06/07/2024, 10:21 AM

## 2024-06-07 NOTE — Evaluation (Signed)
 Physical Therapy Brief Evaluation and Discharge Note Patient Details Name: [name] MRN: [id number] DOB: 11-19-1965 Today's Date: 06/07/2024   History of Present Illness  Pt is a 58 yo male presenting to [facility] on 06/06/24 with Left face and Left arm numbness. CT negative, MRI pending. PMH of DVT, gastric bypass sx, anemia.  Clinical Impression  Patient independent with mobility without neuro signs at this time.  No further skilled PT needs.        PT Assessment Patient does not need any further PT services  Assistance Needed at Discharge  None    Equipment Recommendations None recommended by PT  Recommendations for Other Services       Precautions/Restrictions Precautions Precautions: None Recall of Precautions/Restrictions: Intact        Mobility  Bed Mobility   Supine/Sidelying to sit: Independent      Transfers Overall transfer level: Independent                      Ambulation/Gait Ambulation/Gait assistance: Independent Gait Distance (Feet): 200 Feet Assistive device: None Gait Pattern/deviations: WFL(Within Functional Limits) Gait Speed: Pace WFL    Home Activity Instructions    Stairs Stairs: Yes Stairs assistance: Independent Stair Management: One rail Right, Alternating pattern, Forwards Number of Stairs: 3    Modified Rankin (Stroke Patients Only) Modified Rankin (Stroke Patients Only) Pre-Morbid Rankin Score: No symptoms Modified Rankin: No symptoms      Balance Overall balance assessment: Independent               Standardized Balance Assessment Standardized Balance Assessment : Dynamic Gait Index Dynamic Gait Index Level Surface: Normal Change in Gait Speed: Normal Gait with Horizontal Head Turns: Normal Gait with Vertical Head Turns: Normal Gait and Pivot Turn: Normal Step Over Obstacle: Normal Step Around Obstacles: Normal Steps: Mild Impairment Total Score: 23      Pertinent Vitals/Pain   Pain  Assessment Pain Assessment: No/denies pain     Home Living Family/patient expects to be discharged to:: Private residence Living Arrangements: Spouse/significant other;Children Available Help at Discharge: Family Home Environment: Level entry   Home Equipment: None   Additional Comments: ind, scheduled for outpatient PT for neck issues    Prior Function Level of Independence: Independent Comments: not working    UE/LE Assessment   UE ROM/Strength/Tone/Coordination: [employer]    LE ROM/Strength/Tone/Coordination: [facility]      Communication   Communication Communication: No apparent difficulties     Cognition Overall Cognitive Status: Appears within functional limits for tasks assessed/performed       General Comments General comments (skin integrity, edema, etc.): 5x sit to stand in 16.6 sec; educated in stroke warning signs with BE FAST and in modifiable risk factors    Exercises     Assessment/Plan    PT Problem List         PT Visit Diagnosis Other symptoms and signs involving the nervous system (R29.898)    No Skilled PT All education completed;Patient is independent with all acitivity/mobility   Co-evaluation                AMPAC 6 Clicks Help needed turning from your back to your side while in a flat bed without using bedrails?: None Help needed moving from lying on your back to sitting on the side of a flat bed without using bedrails?: None Help needed moving to and from a bed to a chair (including a wheelchair)?: None Help needed standing up  from a chair using your arms (e.g., wheelchair or bedside chair)?: None Help needed to walk in hospital room?: None Help needed climbing 3-5 steps with a railing? : None 6 Click Score: 24      End of Session Equipment Utilized During Treatment: Gait belt Activity Tolerance: Patient tolerated treatment well Patient left: in bed   PT Visit Diagnosis: Other symptoms and signs involving the nervous system  (R29.898)     Time: 9079-9060 PT Time Calculation (min) (ACUTE ONLY): 19 min  Charges:   PT Evaluation $PT Eval Low Complexity: 1 Low      Micheline Rhodes, PT Acute Rehabilitation Services Office:[phone] 06/07/2024   [name]  06/07/2024, 12:27 PM

## 2024-06-08 DIAGNOSIS — R200 Anesthesia of skin: Secondary | ICD-10-CM | POA: Diagnosis not present

## 2024-06-08 MED ORDER — ASPIRIN 81 MG PO TBEC
81.0000 mg | DELAYED_RELEASE_TABLET | Freq: Every day | ORAL | 12 refills | Status: DC
  Filled 2024-06-08: qty 30, 30d supply, fill #0

## 2024-06-08 MED ORDER — ATORVASTATIN CALCIUM 40 MG PO TABS
40.0000 mg | ORAL_TABLET | Freq: Every day | ORAL | 0 refills | Status: DC
  Filled 2024-06-08: qty 30, 30d supply, fill #0

## 2024-06-08 MED ORDER — CLOPIDOGREL BISULFATE 75 MG PO TABS
75.0000 mg | ORAL_TABLET | Freq: Every day | ORAL | 0 refills | Status: DC
  Filled 2024-06-08: qty 21, 21d supply, fill #0

## 2024-06-08 MED ORDER — ATORVASTATIN CALCIUM 40 MG PO TABS: 40.0000 mg | ORAL_TABLET | Freq: Every day | ORAL | 0 refills | Status: DC

## 2024-06-08 MED ORDER — CLOPIDOGREL BISULFATE 75 MG PO TABS: 75.0000 mg | ORAL_TABLET | Freq: Every day | ORAL | 0 refills | Status: AC

## 2024-06-08 MED ORDER — ASPIRIN 81 MG PO TBEC: 81.0000 mg | DELAYED_RELEASE_TABLET | Freq: Every day | ORAL | 12 refills | Status: AC

## 2024-06-08 NOTE — Plan of Care (Signed)
  Problem: Education: Goal: Knowledge of General Education information will improve Description: Including pain rating scale, medication(s)/side effects and non-pharmacologic comfort measures Outcome: Adequate for Discharge   Problem: Health Behavior/Discharge Planning: Goal: Ability to manage health-related needs will improve Outcome: Adequate for Discharge   Problem: Clinical Measurements: Goal: Ability to maintain clinical measurements within normal limits will improve Outcome: Adequate for Discharge Goal: Will remain free from infection Outcome: Adequate for Discharge Goal: Diagnostic test results will improve Outcome: Adequate for Discharge Goal: Respiratory complications will improve Outcome: Adequate for Discharge Goal: Cardiovascular complication will be avoided Outcome: Adequate for Discharge   Problem: Activity: Goal: Risk for activity intolerance will decrease Outcome: Adequate for Discharge   Problem: Nutrition: Goal: Adequate nutrition will be maintained Outcome: Adequate for Discharge   Problem: Coping: Goal: Level of anxiety will decrease Outcome: Adequate for Discharge   Problem: Elimination: Goal: Will not experience complications related to bowel motility Outcome: Adequate for Discharge Goal: Will not experience complications related to urinary retention Outcome: Adequate for Discharge   Problem: Pain Managment: Goal: General experience of comfort will improve and/or be controlled Outcome: Adequate for Discharge   Problem: Safety: Goal: Ability to remain free from injury will improve Outcome: Adequate for Discharge   Problem: Skin Integrity: Goal: Risk for impaired skin integrity will decrease Outcome: Adequate for Discharge   Problem: Education: Goal: Knowledge of disease or condition will improve Outcome: Adequate for Discharge Goal: Knowledge of secondary prevention will improve (MUST DOCUMENT ALL) Outcome: Adequate for Discharge Goal:  Knowledge of patient specific risk factors will improve (DELETE if not current risk factor) Outcome: Adequate for Discharge   Problem: Ischemic Stroke/TIA Tissue Perfusion: Goal: Complications of ischemic stroke/TIA will be minimized Outcome: Adequate for Discharge   Problem: Coping: Goal: Will verbalize positive feelings about self Outcome: Adequate for Discharge Goal: Will identify appropriate support needs Outcome: Adequate for Discharge   Problem: Health Behavior/Discharge Planning: Goal: Ability to manage health-related needs will improve Outcome: Adequate for Discharge Goal: Goals will be collaboratively established with patient/family Outcome: Adequate for Discharge   Problem: Self-Care: Goal: Ability to participate in self-care as condition permits will improve Outcome: Adequate for Discharge Goal: Verbalization of feelings and concerns over difficulty with self-care will improve Outcome: Adequate for Discharge Goal: Ability to communicate needs accurately will improve Outcome: Adequate for Discharge   Problem: Nutrition: Goal: Risk of aspiration will decrease Outcome: Adequate for Discharge Goal: Dietary intake will improve Outcome: Adequate for Discharge

## 2024-06-08 NOTE — Discharge Summary (Signed)
 Physician Discharge Summary  [name] FMW:[id number] DOB: 1965-12-29 DOA: 06/06/2024  PCP: Pcp, No  Admit date: 06/06/2024  Discharge date: 06/08/2024  Admitted From: Home  Disposition:  Home  Recommendations for Outpatient Follow-up:  Follow up with PCP in 1-2 weeks. Please obtain BMP/CBC in one week. Advised to take aspirin  and Plavix  ( Dual antiplatelet therapy for 21 days followed by aspirin  monotherapy indefinitely). Advised to follow-up with neurology as scheduled.  Home Health:None Equipment/Devices:None  Discharge Condition: Stable CODE STATUS:Full code Diet recommendation: Heart Healthy   Brief [facility] Course: This 58 y.o. male with medical history significant of DVT, gastric bypass surgery, anemia presenting with numbness.  Patient has developed left face and left arm numbness around 12:45 AM last night.  Patient also reports associated intermittent palpitations.  Patient reports history of spine issues and had been scheduled for MRI C-spine soon with a concern for nerve impingement.  CT head shows no acute abnormality.  CT head and neck no acute abnormality.  Teleneurology was consulted recommended aspirin  and Plavix  and stroke workup.  Patient was admitted for further evaluation.  MRI brain ruled out acute stroke.  Echocardiogram unremarkable.  Stroke workup completed.  Neurology recommended aspirin  and Plavix  for 21 days followed by aspirin  monotherapy indefinitely. Patient feels better and wants to be discharged.  symptoms resolved.   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   Peroneal DVT (deep venous thrombosis) (HCC)  Numbness: Presumed CVA: Patient presented with new onset left facial and arm numbness overnight.  No other deficits noted.  He also reported associated intermittent palpitations. CT head and CTA head neck showed no acute abnormality. Tele-neurology was  consulted at the ED overnight and recommended aspirin  and Plavix  and stroke workup. History of spine disease,  plan for outpatient MRI per patient and family. Allow for permissive HTN (systolic < 220 and diastolic < 120)  Continue with aspirin  and Plavix  for now Statin, depending on results of lipid panel MRI Rule out acute stroke.  Echocardiogram unremarkable. Neurology signed off.  Patient being discharged on aspirin  and Plavix  for 21 days followed by aspirin  monotherapy.   History of DVT: History of gastric bypass - Noted  Discharge Instructions  Discharge Instructions     Ambulatory referral to Neurology   Complete by: As directed    Follow up with Dr. Chalice at [facility] in 4 weeks for TIA and OSA testing. Thanks.   Call MD for:  difficulty breathing, headache or visual disturbances   Complete by: As directed    Call MD for:  persistant dizziness or light-headedness   Complete by: As directed    Call MD for:  persistant nausea and vomiting   Complete by: As directed    Diet - low sodium heart healthy   Complete by: As directed    Diet Carb Modified   Complete by: As directed    Discharge instructions   Complete by: As directed    Advised to follow-up with primary care physician in 1 week. Advised to take aspirin  and Plavix  dual antiplatelet therapy for 21 days followed by aspirin  monotherapy indefinitely. Advised to follow-up with neurology as scheduled.   Increase activity slowly   Complete by: As directed       Allergies as of 06/08/2024   No Known Allergies      Medication List     STOP taking these medications    ibuprofen 800 MG tablet  Commonly known as: ADVIL       TAKE these medications    aspirin  EC 81 MG tablet Take 1 tablet (81 mg total) by mouth daily. Swallow whole.   atorvastatin  40 MG tablet Commonly known as: LIPITOR Take 1 tablet (40 mg total) by mouth daily.   clopidogrel  75 MG tablet Commonly known as: PLAVIX  Take 1 tablet (75 mg  total) by mouth daily for 21 days.   multivitamin tablet Take 1 tablet by mouth daily.   silver  sulfADIAZINE  1 % cream Commonly known as: SILVADENE  Apply 1 Application topically 2 (two) times daily. What changed: when to take this   tadalafil 5 MG tablet Commonly known as: CIALIS Take 5 mg by mouth daily as needed for erectile dysfunction.   tiZANidine 2 MG tablet Commonly known as: ZANAFLEX Take 2 mg by mouth every 6 (six) hours as needed for muscle spasms.   Vitamin D 125 MCG (5000 UT) Caps Take 1 capsule by mouth daily.        Follow-up Information     Dohmeier, Dedra, MD. Schedule an appointment as soon as possible for a visit in 1 month(s).   Specialty: Neurology Why: for mini stroke and sleep apnea Contact information: [address] [place] [address] [phone]         [facility] Follow up in 3 week(s).   Contact information: [address] [place] [address] [phone]                No Known Allergies  Consultations: Neurology   Procedures/Studies: ECHOCARDIOGRAM COMPLETE Result Date: 06/07/2024    ECHOCARDIOGRAM REPORT   Patient Name:   [name] Date of Exam: 06/07/2024 Medical Rec #:  [id number]      Height:       69.5 in Accession #:    [id number]     Weight:       238.0 lb Date of Birth:  1966/01/05      BSA:          2.236 m Patient Age:    58 years       BP:           126/84 mmHg Patient Gender: M              HR:           72 bpm. Exam Location:  Inpatient Procedure: 2D Echo, Cardiac Doppler and Color Doppler (Both Spectral and Color            Flow Doppler were utilized during procedure). Indications:    Stroke I63.9  History:        Patient has prior history of Echocardiogram examinations, most                 recent 10/08/2021.  Sonographer:    Thea Norlander RCS Referring Phys: [id number] MARSA NOVAK MELVIN IMPRESSIONS  1. Left ventricular ejection fraction, by estimation, is 65  to 70%. The left ventricle has normal function. The left ventricle has no regional wall motion abnormalities. Left ventricular diastolic parameters were normal.  2. Right ventricular systolic function is normal. The right ventricular size is normal.  3. The mitral valve is normal in structure. Mild mitral valve regurgitation.  4. The aortic valve is tricuspid. Aortic valve regurgitation is not visualized. FINDINGS  Left Ventricle: Left ventricular ejection fraction, by estimation, is 65 to 70%. The left ventricle has normal function. The left ventricle has no  regional wall motion abnormalities. The left ventricular internal cavity size was normal in size. There is  no left ventricular hypertrophy. Left ventricular diastolic parameters were normal. Right Ventricle: The right ventricular size is normal. Right vetricular wall thickness was not assessed. Right ventricular systolic function is normal. Left Atrium: Left atrial size was normal in size. Right Atrium: Right atrial size was normal in size. Pericardium: There is no evidence of pericardial effusion. Mitral Valve: The mitral valve is normal in structure. Mild mitral valve regurgitation. Tricuspid Valve: The tricuspid valve is normal in structure. Tricuspid valve regurgitation is trivial. Aortic Valve: The aortic valve is tricuspid. Aortic valve regurgitation is not visualized. Aortic valve peak gradient measures 7.0 mmHg. Pulmonic Valve: The pulmonic valve was normal in structure. Pulmonic valve regurgitation is not visualized. Aorta: The aortic root and ascending aorta are structurally normal, with no evidence of dilitation. IAS/Shunts: No atrial level shunt detected by color flow Doppler.  LEFT VENTRICLE PLAX 2D LVIDd:         4.70 cm   Diastology LVIDs:         2.90 cm   LV e' medial:    7.51 cm/s LV PW:         1.10 cm   LV E/e' medial:  8.3 LV IVS:        0.80 cm   LV e' lateral:   10.20 cm/s LVOT diam:     2.60 cm   LV E/e' lateral: 6.1 LV SV:         80  LV SV Index:   36 LVOT Area:     5.31 cm  RIGHT VENTRICLE RV S prime:     16.80 cm/s TAPSE (M-mode): 2.8 cm LEFT ATRIUM           Index        RIGHT ATRIUM           Index LA diam:      3.60 cm 1.61 cm/m   RA Area:     13.40 cm LA Vol (A2C): 33.4 ml 14.94 ml/m  RA Volume:   28.40 ml  12.70 ml/m LA Vol (A4C): 66.9 ml 29.90 ml/m  AORTIC VALVE AV Area (Vmax): 2.95 cm AV Vmax:        132.00 cm/s AV Peak Grad:   7.0 mmHg LVOT Vmax:      73.40 cm/s LVOT Vmean:     50.800 cm/s LVOT VTI:       0.151 m  AORTA Ao Root diam: 3.70 cm Ao Asc diam:  3.50 cm MITRAL VALVE MV Area (PHT): 3.85 cm    SHUNTS MV Decel Time: 197 msec    Systemic VTI:  0.15 m MV E velocity: 62.40 cm/s  Systemic Diam: 2.60 cm MV A velocity: 54.50 cm/s MV E/A ratio:  1.14 Vina Gull MD Electronically signed by Vina Gull MD Signature Date/Time: 06/07/2024/4:57:46 PM    Final    MR BRAIN WO CONTRAST Result Date: 06/07/2024 EXAM: MRI BRAIN WITHOUT CONTRAST 06/07/2024 08:52:35 AM TECHNIQUE: Multiplanar multisequence MRI of the head/brain was performed without the administration of intravenous contrast. COMPARISON: None available. CLINICAL HISTORY: Stroke, follow up. Numbness of the left face and arm. FINDINGS: BRAIN AND VENTRICLES: No acute infarct. No intracranial hemorrhage. No mass. No midline shift. No hydrocephalus. The sella is unremarkable. Normal flow voids. Scattered subcortical T2 hyperintensities are mildly advanced for age. ORBITS: No acute abnormality. SINUSES AND MASTOIDS: No acute abnormality. BONES AND SOFT TISSUES: Normal marrow signal. No  acute soft tissue abnormality. IMPRESSION: 1. No acute intracranial abnormality. 2. Scattered subcortical T2 hyperintensities, mildly advanced for age. Electronically signed by: Lonni Necessary MD 06/07/2024 10:10 AM EDT RP Workstation: [id number]   CT ANGIO HEAD NECK W WO CM Result Date: 06/06/2024 CLINICAL DATA:  Initial evaluation for acute neuro deficit, stroke suspected. EXAM: CT  ANGIOGRAPHY HEAD AND NECK WITH AND WITHOUT CONTRAST TECHNIQUE: Multidetector CT imaging of the head and neck was performed using the standard protocol during bolus administration of intravenous contrast. Multiplanar CT image reconstructions and MIPs were obtained to evaluate the vascular anatomy. Carotid stenosis measurements (when applicable) are obtained utilizing NASCET criteria, using the distal internal carotid diameter as the denominator. RADIATION DOSE REDUCTION: This exam was performed according to the departmental dose-optimization program which includes automated exposure control, adjustment of the mA and/or kV according to patient size and/or use of iterative reconstruction technique. CONTRAST:  75mL OMNIPAQUE  IOHEXOL  350 MG/ML SOLN COMPARISON:  CT from earlier the same day. FINDINGS: CTA NECK FINDINGS Aortic arch: Standard branching. Imaged portion shows no evidence of aneurysm or dissection. No significant stenosis of the major arch vessel origins. Right carotid system: No evidence of dissection, stenosis (50% or greater), or occlusion. Left carotid system: No evidence of dissection, stenosis (50% or greater), or occlusion. Vertebral arteries: Both vertebral arteries are diminutive but patent without stenosis or dissection. Skeleton: No worrisome osseous lesions. Moderate to advanced multilevel cervical spondylosis with bulky anterior osteophytic spurring. Other neck: No other acute finding. Upper chest: No other acute finding. Review of the MIP images confirms the above findings CTA HEAD FINDINGS Anterior circulation: Evaluation of the intracranial circulation somewhat limited by timing the contrast bolus. Both internal carotid arteries are patent through the siphons without visible stenosis or other abnormality. A1 segments patent bilaterally. Normal anterior communicating complex. Anterior cerebral arteries patent without visible stenosis. No M1 stenosis or occlusion. No proximal MCA branch  occlusion. Distal MCA branches perfused and symmetric. Posterior circulation: Left V4 segment dominant and patent without stenosis. Left PICA not seen. Right vertebral artery largely terminates in PICA, although a tiny branch is seen ascending towards the vertebrobasilar junction. Right PICA patent at its origin. Basilar diminutive but patent without stenosis. Superior cerebral arteries patent bilaterally. Predominant fetal type origin of the PCAs bilaterally. Both PCAs patent to their distal aspects without significant stenosis. Venous sinuses: Patent allowing for timing the contrast bolus. Anatomic variants: As above.  No visible aneurysm. Review of the MIP images confirms the above findings IMPRESSION: Negative CTA of the head and neck. No large vessel occlusion or other emergent finding. No hemodynamically significant or correctable stenosis. Electronically Signed   By: Morene Hoard M.D.   On: 06/06/2024 03:11   CT HEAD CODE STROKE WO CONTRAST Result Date: 06/06/2024 CLINICAL DATA:  Code stroke. Initial evaluation for acute neuro deficit, stroke suspected. EXAM: CT HEAD WITHOUT CONTRAST TECHNIQUE: Contiguous axial images were obtained from the base of the skull through the vertex without intravenous contrast. RADIATION DOSE REDUCTION: This exam was performed according to the departmental dose-optimization program which includes automated exposure control, adjustment of the mA and/or kV according to patient size and/or use of iterative reconstruction technique. COMPARISON:  Prior study from 01/21/2024 FINDINGS: Brain: Cerebral volume within normal limits for patient age. No acute intracranial hemorrhage. No acute large vessel territory infarct. No mass lesion, midline shift, or mass effect. Ventricles are normal in size without hydrocephalus. No extra-axial fluid collection. Vascular: No abnormal hyperdense vessel. Skull: Scalp soft tissues demonstrate  no acute abnormality. Calvarium intact.  Sinuses/Orbits: Globes and orbital soft tissues within normal limits. Visualized paranasal sinuses are largely clear. No significant mastoid effusion. ASPECTS [facility] Stroke Program Early CT Score) - Ganglionic level infarction (caudate, lentiform nuclei, internal capsule, insula, M1-M3 cortex): 7 - Supraganglionic infarction (M4-M6 cortex): 3 Total score (0-10 with 10 being normal): 10 IMPRESSION: 1. Negative head CT.  No acute intracranial abnormality. 2. ASPECTS is 10. Results were called by telephone at the time of interpretation on 06/06/2024 at 1:30 am to provider [facility] , who verbally acknowledged these results. Electronically Signed   By: Morene Hoard M.D.   On: 06/06/2024 01:30    Subjective: Patient was seen and examined at bedside. Overnight events noted. Patient reports feeling much improved and wants to be discharged. Symptoms resolved.  Discharge Exam: Vitals:   06/08/24 0445 06/08/24 0846  BP: (!) 93/57 120/67  Pulse: (!) 55 (!) 56  Resp: 16   Temp: 98.1 F (36.7 C) 97.6 F (36.4 C)  SpO2: 98% 99%   Vitals:   06/07/24 2011 06/08/24 0025 06/08/24 0445 06/08/24 0846  BP: 107/73 (!) 109/59 (!) 93/57 120/67  Pulse: (!) 58 67 (!) 55 (!) 56  Resp: 16 16 16    Temp: 97.7 F (36.5 C) 98 F (36.7 C) 98.1 F (36.7 C) 97.6 F (36.4 C)  TempSrc: Oral Oral Oral Oral  SpO2: 98% 99% 98% 99%  Weight:      Height:        General: Pt is alert, awake, not in acute distress Cardiovascular: RRR, S1/S2 +, no rubs, no gallops Respiratory: CTA bilaterally, no wheezing, no rhonchi Abdominal: Soft, NT, ND, bowel sounds + Extremities: no edema, no cyanosis    The results of significant diagnostics from this hospitalization (including imaging, microbiology, ancillary and laboratory) are listed below for reference.     Microbiology: No results found for this or any previous visit (from the past 240 hours).   Labs: BNP (last 3 results) No results for input(s): BNP  in the last 8760 hours. Basic Metabolic Panel: Recent Labs  Lab 06/06/24 0129 06/07/24 0358  NA 139 135  K 4.0 4.1  CL 104 106  CO2 23 22  GLUCOSE 92 89  BUN 19 14  CREATININE 1.12 0.99  CALCIUM  9.4 8.6*   Liver Function Tests: Recent Labs  Lab 06/06/24 0129 06/07/24 0358  AST 23 17  ALT 19 16  ALKPHOS 94 66  BILITOT 0.4 0.6  PROT 6.9 6.0*  ALBUMIN 4.3 3.3*   No results for input(s): LIPASE, AMYLASE in the last 168 hours. No results for input(s): AMMONIA in the last 168 hours. CBC: Recent Labs  Lab 06/06/24 0129 06/07/24 0358  WBC 5.7 5.1  NEUTROABS 2.9  --   HGB 11.4* 11.4*  HCT 35.9* 35.7*  MCV 83.3 83.4  PLT 216 210   Cardiac Enzymes: No results for input(s): CKTOTAL, CKMB, CKMBINDEX, TROPONINI in the last 168 hours. BNP: Invalid input(s): POCBNP CBG: Recent Labs  Lab 06/06/24 0111  GLUCAP 81   D-Dimer No results for input(s): DDIMER in the last 72 hours. Hgb A1c Recent Labs    06/07/24 0358  HGBA1C 5.2   Lipid Profile Recent Labs    06/07/24 0358  CHOL 135  HDL 47  LDLCALC 80  TRIG 41  CHOLHDL 2.9   Thyroid  function studies No results for input(s): TSH, T4TOTAL, T3FREE, THYROIDAB in the last 72 hours.  Invalid input(s): FREET3 Anemia work up No results  for input(s): VITAMINB12, FOLATE, FERRITIN, TIBC, IRON, RETICCTPCT in the last 72 hours. Urinalysis    Component Value Date/Time   COLORURINE YELLOW 10/15/2014 0836   APPEARANCEUR CLEAR 10/15/2014 0836   LABSPEC 1.029 10/15/2014 0836   PHURINE 5.0 10/15/2014 0836   GLUCOSEU NEGATIVE 10/15/2014 0836   GLUCOSEU NEGATIVE 04/22/2013 0922   HGBUR NEGATIVE 10/15/2014 0836   BILIRUBINUR NEGATIVE 10/15/2014 0836   KETONESUR NEGATIVE 10/15/2014 0836   PROTEINUR NEGATIVE 10/15/2014 0836   UROBILINOGEN 0.2 10/15/2014 0836   NITRITE NEGATIVE 10/15/2014 0836   LEUKOCYTESUR NEGATIVE 10/15/2014 0836   Sepsis Labs Recent Labs  Lab 06/06/24 0129  06/07/24 0358  WBC 5.7 5.1   Microbiology No results found for this or any previous visit (from the past 240 hours).   Time coordinating discharge: Over 30 minutes  SIGNED:   Darcel Dawley, MD  [facility] 06/08/2024, 1:15 PM Pager   If 7PM-7AM, please contact night-coverage

## 2024-06-08 NOTE — Plan of Care (Signed)

## 2024-06-08 NOTE — Discharge Instructions (Signed)
 Advised to follow-up with primary care physician in 1 week. Advised to take aspirin  and Plavix  dual antiplatelet therapy for 21 days followed by aspirin  monotherapy indefinitely. Advised to follow-up with neurology as scheduled.

## 2024-06-08 NOTE — Plan of Care (Signed)
  Problem: Education: Goal: Knowledge of General Education information will improve Description: Including pain rating scale, medication(s)/side effects and non-pharmacologic comfort measures 06/08/2024 0628 by Cosme Alfonso CROME, RN Outcome: Progressing 06/08/2024 0547 by Cosme Alfonso CROME, RN Outcome: Progressing   Problem: Health Behavior/Discharge Planning: Goal: Ability to manage health-related needs will improve 06/08/2024 0628 by Cosme Alfonso CROME, RN Outcome: Progressing 06/08/2024 0547 by Cosme Alfonso CROME, RN Outcome: Progressing   Problem: Clinical Measurements: Goal: Ability to maintain clinical measurements within normal limits will improve 06/08/2024 0628 by Cosme Alfonso CROME, RN Outcome: Progressing 06/08/2024 0547 by Cosme Alfonso CROME, RN Outcome: Progressing Goal: Will remain free from infection 06/08/2024 0628 by Cosme Alfonso CROME, RN Outcome: Progressing 06/08/2024 0547 by Cosme Alfonso CROME, RN Outcome: Progressing Goal: Diagnostic test results will improve Outcome: Progressing

## 2024-06-09 ENCOUNTER — Ambulatory Visit: Attending: Orthopedic Surgery | Admitting: Physical Therapy

## 2024-06-09 ENCOUNTER — Other Ambulatory Visit (HOSPITAL_COMMUNITY): Payer: Self-pay

## 2024-06-10 ENCOUNTER — Ambulatory Visit

## 2024-06-12 ENCOUNTER — Ambulatory Visit: Attending: Orthopedic Surgery | Admitting: Physical Therapy

## 2024-06-12 ENCOUNTER — Encounter: Payer: Self-pay | Admitting: Physical Therapy

## 2024-06-12 ENCOUNTER — Other Ambulatory Visit: Payer: Self-pay

## 2024-06-12 DIAGNOSIS — M5459 Other low back pain: Secondary | ICD-10-CM | POA: Diagnosis present

## 2024-06-12 NOTE — Therapy (Addendum)
 OUTPATIENT PHYSICAL THERAPY THORACOLUMBAR EVALUATION   Patient Name: [name] MRN: [id number] DOB:03/28/1966, 58 y.o., male Today's Date: 06/12/2024  END OF SESSION:  PT End of Session - 06/12/24 0848     Visit Number 1    Number of Visits 1    PT Start Time 0830    PT Stop Time 0810    PT Time Calculation (min) 1420 min          Past Medical History:  Diagnosis Date   Back pain    compensating from hip pain   History of colon polyps    Joint pain    Osteoarthritis    knees 10/22/2014)   Past Surgical History:  Procedure Laterality Date   BREATH TEK H PYLORI  02/14/2012   Procedure: BREATH TEK H PYLORI;  Surgeon: Alm VEAR Angle, MD;  Location: [facility];  Service: General;  Laterality: N/A;   COLONOSCOPY     ESOPHAGOGASTRODUODENOSCOPY     FRACTURE SURGERY     GASTRIC ROUX-EN-Y  06/17/2012   Procedure: LAPAROSCOPIC ROUX-EN-Y GASTRIC;  Surgeon: Alm VEAR Angle, MD;  Location: [facility];  Service: General;  Laterality: N/A;   JOINT REPLACEMENT     KNEE HARDWARE REMOVAL Left 1986   took pin out   PATELLA FRACTURE SURGERY Left 1985   pin placed   TONSILLECTOMY AND ADENOIDECTOMY  ~ 1973   TOTAL HIP ARTHROPLASTY Right 10/21/2014   Procedure: TOTAL HIP ARTHROPLASTY;  Surgeon: Dempsey JINNY Sensor, MD;  Location: [facility];  Service: Orthopedics;  Laterality: Right;   TOTAL KNEE ARTHROPLASTY Left 09/12/2013   Procedure: TOTAL KNEE ARTHROPLASTY;  Surgeon: Dempsey JINNY Sensor, MD;  Location: [facility];  Service: Orthopedics;  Laterality: Left;   WRIST FRACTURE SURGERY Left ~ 1975   Patient Active Problem List   Diagnosis Date Noted   Numbness 06/06/2024   Peroneal DVT (deep venous thrombosis) (HCC) 11/26/2014   Edema 11/26/2014   Osteoarthritis of right hip 10/21/2014   Arthritis of right hip 10/21/2014   Arthritis of knee, left, far valgus 09/12/2013   History of Roux-en-Y gastric bypass, 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 07/03/2012   DJD (degenerative joint disease) of knee 03/22/2012   Morbid obesity,  Weight - 349, BMI - 52.9 01/31/2012    PCP: no PCP per pt chart  REFERRING PROVIDER: Sensor Dempsey, MD  REFERRING DIAG: Spinal stenosis  Rationale for Evaluation and Treatment: Rehabilitation  THERAPY DIAG:  Other low back pain  PERTINENT HISTORY: See PMH and assessment  WEIGHT BEARING RESTRICTIONS: No  FALLS:  Has patient fallen in last 6 months? No  ---------------------------------------------------------------------------------------------  ASSESSMENT:  CLINICAL IMPRESSION: Eval impression (06/12/2024): Pt. attended today's physical therapy session for evaluation of spinal stenosis. It was noted in pt chart that pt was recently d/c from observation d/t a TIA with a f/u with neurology and a sleep specialist for the TIA alongside heart palpitations reportedly secondary to OSA. Pt requested to hold off on therapy until he is settled and stable with the other medical providers. I am agreeable to this plan as to ensure pt safety and long term outcomes. Education was given to continue long term HEP from initial from previous POC in May, 2025. Education was also given to record BP in journal in the morning as well as if/when symptoms arise, especially around exercise. If symptoms arise and do not alleviate with rest, pt was educated to reach out to the appropriate services such as UC or EMS depending on severity or symptom presentation. Pt reported  and demonstrated competence with teach back.  ---------------------------------------------------------------------------------------------   Mabel Kiang, PT, DPT 06/12/2024, 8:54 AM

## 2024-06-18 ENCOUNTER — Telehealth: Payer: Self-pay | Admitting: Cardiology

## 2024-06-18 NOTE — Telephone Encounter (Signed)
 Call to patient to discuss concerns.   Patient last seen by Dr. Ladona on 01/03/23. Has overdue f/u visit scheduled for 07/15/24 with K. Devora, NP.  He was seen in ED on 06/06/24 for TIA, low blood pressures were documented at that time. Notes from discharge summary state : Allow for permissive HTN (systolic < 220 and diastolic < 120)  Today he calls to report elevated BP as documented. At the time of my call he reports a BP of 137/82. He states he has also been having headaches and palpitations at night that are keeping him up. He states he initially thought the palpitations might be related to sleep apnea, for which he was recently referred for workup. However, thepalpitations have been keeping him up at night along with headaches. He states he cannot take tylenol  or ibuprofen for headaches as he has had gastric bypass before. He denies any chest pain, SOB or leg swelling.   Patient is requesting to start on meds for lowering bp and is asking if he can be seen sooner than 07/15/24.Forwarded to Dr. Ladona.

## 2024-06-18 NOTE — Telephone Encounter (Signed)
 Pt c/o BP issue: STAT if pt c/o blurred vision, one-sided weakness or slurred speech.  STAT if BP is GREATER than 180/120 TODAY.  STAT if BP is LESS than 90/60 and SYMPTOMATIC TODAY  1. What is your BP concern? BP is elevated   2. Have you taken any BP medication today?No, pt would like to start taking one to help manage his BP  3. What are your last 5 BP readings?129/75 140/85 139/85 136/80 148/79  4. Are you having any other symptoms (ex. Dizziness, headache, blurred vision, passed out)? Headache

## 2024-06-18 NOTE — Telephone Encounter (Signed)
 Not sure about his BP recordings. Throughout the hospital stay, BP was normal. His stroke work up was negative with MRI and may have had TIA. Sure he can follow up with us  or his PCP. Would not make any Rx without evaluation.  JG

## 2024-06-19 NOTE — Telephone Encounter (Signed)
 I spoke with patient and gave him message from Dr Ladona.  He will bring his BP readings to office visit

## 2024-06-24 ENCOUNTER — Ambulatory Visit: Admitting: Diagnostic Neuroimaging

## 2024-06-24 ENCOUNTER — Encounter: Payer: Self-pay | Admitting: Diagnostic Neuroimaging

## 2024-06-24 VITALS — BP 148/80 | HR 86 | Ht 69.0 in | Wt 235.8 lb

## 2024-06-24 DIAGNOSIS — G459 Transient cerebral ischemic attack, unspecified: Secondary | ICD-10-CM | POA: Diagnosis not present

## 2024-06-24 NOTE — Progress Notes (Signed)
 [facility]  PATIENT: [name] DOB: Nov 04, 1965  REFERRING CLINICIAN: Jerri Pfeiffer, MD HISTORY FROM: patient  REASON FOR VISIT: new consult   HISTORICAL  CHIEF COMPLAINT:  Chief Complaint  Patient presents with   New Patient (Initial Visit)    RM 6, Pt w/wife. Pt states had TIA and is here for f/u. Pt states still has some numbness in left arm and feels some pressure in left side of head. Pt states taking Zanaflex daily for sleep and states has been having palpitations. Has appt w/Dr. Chalice for sleep consult.    HISTORY OF PRESENT ILLNESS:   58 year old male here for evaluation of possible TIA.  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  06/06/2024 patient woke up after midnight with onset of left arm and left leg numbness and tingling sensation.  Symptoms lasted about 30 minutes.  He drove himself to the emergency room for evaluation.  He was admitted for workup.  MRI of the brain was negative for acute findings.  He was diagnosed with possible TIA and evaluated medically.  He was discharged on aspirin , Plavix , statin.  Since then he has been doing well.  No recurrent symptoms.  He does report 6 to 8 hours of sleep at night.  He does have excessive daytime sleepiness.  There was some witnessed apnea in the hospital and they recommended patient to have sleep apnea testing.   REVIEW OF SYSTEMS: Full 14 system review of systems performed and negative with exception of: As per HPI.  ALLERGIES: No Known Allergies  HOME MEDICATIONS: Outpatient Medications Prior to Visit  Medication Sig Dispense Refill   aspirin  EC 81 MG tablet Take 1 tablet (81 mg total) by mouth daily. Swallow whole. 30 tablet 12   atorvastatin  (LIPITOR) 40 MG tablet Take 1 tablet (40 mg total) by mouth daily. 30 tablet 0   clopidogrel  (PLAVIX ) 75 MG tablet Take 1 tablet (75 mg total) by mouth daily for 21 days. 21 tablet 0   tiZANidine (ZANAFLEX) 2  MG tablet Take 2 mg by mouth every 6 (six) hours as needed for muscle spasms.     Cholecalciferol (VITAMIN D) 125 MCG (5000 UT) CAPS Take 1 capsule by mouth daily.     Multiple Vitamin (MULTIVITAMIN) tablet Take 1 tablet by mouth daily.     silver  sulfADIAZINE  (SILVADENE ) 1 % cream Apply 1 Application topically 2 (two) times daily. (Patient taking differently: Apply 1 Application topically daily.) 200 g 0   tadalafil (CIALIS) 5 MG tablet Take 5 mg by mouth daily as needed for erectile dysfunction.     No facility-administered medications prior to visit.    PAST MEDICAL HISTORY: Past Medical History:  Diagnosis Date   Back pain    compensating from hip pain   History of colon polyps    Joint pain    Osteoarthritis    knees 10/22/2014)    PAST SURGICAL HISTORY: Past Surgical History:  Procedure Laterality Date   BREATH TEK H PYLORI  02/14/2012   Procedure: BREATH TEK H PYLORI;  Surgeon: Alm VEAR Angle, MD;  Location: [facility];  Service: General;  Laterality: N/A;   COLONOSCOPY     ESOPHAGOGASTRODUODENOSCOPY     FRACTURE SURGERY     GASTRIC ROUX-EN-Y  06/17/2012   Procedure: LAPAROSCOPIC ROUX-EN-Y GASTRIC;  Surgeon: Alm VEAR Angle, MD;  Location: [facility];  Service: General;  Laterality: N/A;   JOINT REPLACEMENT     KNEE HARDWARE  REMOVAL Left 1986   took pin out   PATELLA FRACTURE SURGERY Left 1985   pin placed   TONSILLECTOMY AND ADENOIDECTOMY  ~ 1973   TOTAL HIP ARTHROPLASTY Right 10/21/2014   Procedure: TOTAL HIP ARTHROPLASTY;  Surgeon: Dempsey JINNY Sensor, MD;  Location: [facility];  Service: Orthopedics;  Laterality: Right;   TOTAL KNEE ARTHROPLASTY Left 09/12/2013   Procedure: TOTAL KNEE ARTHROPLASTY;  Surgeon: Dempsey JINNY Sensor, MD;  Location: [facility];  Service: Orthopedics;  Laterality: Left;   WRIST FRACTURE SURGERY Left ~ 1975    FAMILY HISTORY: Family History  Problem Relation Age of Onset   Diabetes Mother 37   Stomach cancer Mother 17   Hypertension Mother    Colon  cancer Mother    Lung cancer Father 75   Hypertension Father    Alcohol abuse Father    Esophageal cancer Neg Hx    Rectal cancer Neg Hx     SOCIAL HISTORY: Social History   Socioeconomic History   Marital status: Married    Spouse name: 1   Number of children: Not on file   Years of education: Not on file   Highest education level: Not on file  Occupational History   Not on file  Tobacco Use   Smoking status: Never   Smokeless tobacco: Never  Vaping Use   Vaping status: Never Used  Substance and Sexual Activity   Alcohol use: No   Drug use: No   Sexual activity: Yes  Other Topics Concern   Not on file  Social History Narrative   Not on file   Social Drivers of Health   Financial Resource Strain: Not on file  Food Insecurity: No Food Insecurity (06/06/2024)   Hunger Vital Sign    Worried About Running Out of Food in the Last Year: Never true    Ran Out of Food in the Last Year: Never true  Transportation Needs: No Transportation Needs (06/06/2024)   PRAPARE - Administrator, Civil Service (Medical): No    Lack of Transportation (Non-Medical): No  Physical Activity: Not on file  Stress: Not on file  Social Connections: Not on file  Intimate Partner Violence: Not At Risk (06/06/2024)   Humiliation, Afraid, Rape, and Kick questionnaire    Fear of Current or Ex-Partner: No    Emotionally Abused: No    Physically Abused: No    Sexually Abused: No     PHYSICAL EXAM  GENERAL EXAM/CONSTITUTIONAL: Vitals:  Vitals:   06/24/24 0928  BP: (!) 148/80  Pulse: 86  Weight: 235 lb 12.8 oz (107 kg)  Height: 5' 9 (1.753 m)   Body mass index is 34.82 kg/m. Wt Readings from Last 10 Encounters:  06/24/24 235 lb 12.8 oz (107 kg)  06/06/24 238 lb (108 kg)  01/21/24 235 lb (106.6 kg)  01/03/23 237 lb 9.6 oz (107.8 kg)  12/31/22 230 lb (104.3 kg)  09/29/21 238 lb 9.6 oz (108.2 kg)  11/18/18 257 lb 6.4 oz (116.8 kg)  10/31/17 273 lb (123.8 kg)  11/26/14 207  lb (93.9 kg)  10/21/14 197 lb (89.4 kg)   Patient is in no distress; well developed, nourished and groomed; neck is supple  CARDIOVASCULAR: Examination of carotid arteries is normal; no carotid bruits Regular rate and rhythm, no murmurs Examination of peripheral vascular system by observation and palpation is normal  EYES: Ophthalmoscopic exam of optic discs and posterior segments is normal; no papilledema or hemorrhages No results found.  MUSCULOSKELETAL:  Gait, strength, tone, movements noted in Neurologic exam below  NEUROLOGIC: MENTAL STATUS:      No data to display         awake, alert, oriented to person, place and time recent and remote memory intact normal attention and concentration language fluent, comprehension intact, naming intact fund of knowledge appropriate  CRANIAL NERVE:  2nd - no papilledema on fundoscopic exam 2nd, 3rd, 4th, 6th - pupils equal and reactive to light, visual fields full to confrontation, extraocular muscles intact, no nystagmus 5th - facial sensation symmetric 7th - facial strength symmetric 8th - hearing intact 9th - palate elevates symmetrically, uvula midline 11th - shoulder shrug symmetric 12th - tongue protrusion midline  MOTOR:  normal bulk and tone, full strength in the BUE, BLE  SENSORY:  normal and symmetric to light touch, temperature, vibration  COORDINATION:  finger-nose-finger, fine finger movements normal  REFLEXES:  deep tendon reflexes present and symmetric  GAIT/STATION:  narrow based gait\     DIAGNOSTIC DATA (LABS, IMAGING, TESTING) - I reviewed patient records, labs, notes, testing and imaging myself where available.  Lab Results  Component Value Date   WBC 5.1 06/07/2024   HGB 11.4 (L) 06/07/2024   HCT 35.7 (L) 06/07/2024   MCV 83.4 06/07/2024   PLT 210 06/07/2024      Component Value Date/Time   NA 135 06/07/2024 0358   NA 137 01/31/2012 1143   K 4.1 06/07/2024 0358   CL 106 06/07/2024  0358   CO2 22 06/07/2024 0358   GLUCOSE 89 06/07/2024 0358   BUN 14 06/07/2024 0358   BUN 17 01/31/2012 1143   CREATININE 0.99 06/07/2024 0358   CALCIUM  8.6 (L) 06/07/2024 0358   PROT 6.0 (L) 06/07/2024 0358   PROT 7.1 01/31/2012 1143   ALBUMIN 3.3 (L) 06/07/2024 0358   ALBUMIN 4.3 01/31/2012 1143   AST 17 06/07/2024 0358   ALT 16 06/07/2024 0358   ALKPHOS 66 06/07/2024 0358   BILITOT 0.6 06/07/2024 0358   GFRNONAA >60 06/07/2024 0358   GFRAA >90 10/22/2014 0517   Lab Results  Component Value Date   CHOL 135 06/07/2024   HDL 47 06/07/2024   LDLCALC 80 06/07/2024   TRIG 41 06/07/2024   CHOLHDL 2.9 06/07/2024   Lab Results  Component Value Date   HGBA1C 5.2 06/07/2024   Lab Results  Component Value Date   VITAMINB12 257 04/22/2013   Lab Results  Component Value Date   TSH 3.810 12/31/2022    06/07/24 MRI brain (without) [I reviewed images myself and agree with interpretation. -VRP]  1. No acute intracranial abnormality. 2. Scattered subcortical T2 hyperintensities, mildly advanced for age.  06/06/24 CTA head / neck  - Negative CTA of the head and neck. No large vessel occlusion or other emergent finding. No hemodynamically significant or correctable stenosis.  06/07/24 TTE 1. Left ventricular ejection fraction, by estimation, is 65 to 70%. The  left ventricle has normal function. The left ventricle has no regional  wall motion abnormalities. Left ventricular diastolic parameters were  normal.   2. Right ventricular systolic function is normal. The right ventricular  size is normal.   3. The mitral valve is normal in structure. Mild mitral valve  regurgitation.   4. The aortic valve is tricuspid. Aortic valve regurgitation is not  visualized.     ASSESSMENT AND PLAN  58 y.o. year old male here with:   Dx:  1. TIA (transient ischemic attack)     PLAN:  Left arm numbness --> left face numbness (06/06/24; 30 minutes; intermittent; possible TIA) -  continue aspirin  81, clopidogrel  75 (until 06/29/24, then stop), atorvastatin  40 - check 30 day cardiac monitor (rule out afib)  Orders Placed This Encounter  Procedures   Vitamin B12   Vitamin B1   Cardiac event monitor   Return for pending if symptoms worsen or fail to improve, pending test results.  I reviewed images, labs, notes, records myself. I summarized findings and reviewed with patient, for this high risk condition (TIA) requiring high complexity decision making.    EDUARD FABIENE HANLON, MD 06/24/2024, 5:56 PM Certified in Neurology, Neurophysiology and Neuroimaging  [facility] [address], [address] [place], [place] [address] [phone]

## 2024-06-24 NOTE — Patient Instructions (Signed)
 Left arm numbness --> left face numbness (06/06/24; 30 minutes; intermittent; possible TIA) - continue aspirin  81, clopidogrel  75 (until 06/29/24, then stop), atorvastatin  40 - check 30 day cardiac monitor (rule out afib)

## 2024-06-29 LAB — VITAMIN B12: Vitamin B-12: 293 pg/mL (ref 232–1245)

## 2024-06-29 LAB — VITAMIN B1: Thiamine: 68.9 nmol/L (ref 66.5–200.0)

## 2024-06-30 ENCOUNTER — Ambulatory Visit: Payer: Self-pay | Admitting: Diagnostic Neuroimaging

## 2024-07-14 ENCOUNTER — Encounter: Payer: Self-pay | Admitting: Neurology

## 2024-07-14 ENCOUNTER — Ambulatory Visit (INDEPENDENT_AMBULATORY_CARE_PROVIDER_SITE_OTHER): Admitting: Neurology

## 2024-07-14 VITALS — BP 120/70 | HR 68 | Ht 70.0 in | Wt 234.8 lb

## 2024-07-14 DIAGNOSIS — R002 Palpitations: Secondary | ICD-10-CM

## 2024-07-14 DIAGNOSIS — Z9189 Other specified personal risk factors, not elsewhere classified: Secondary | ICD-10-CM | POA: Diagnosis not present

## 2024-07-14 DIAGNOSIS — Z9884 Bariatric surgery status: Secondary | ICD-10-CM | POA: Diagnosis not present

## 2024-07-14 DIAGNOSIS — G459 Transient cerebral ischemic attack, unspecified: Secondary | ICD-10-CM | POA: Diagnosis not present

## 2024-07-14 DIAGNOSIS — R0683 Snoring: Secondary | ICD-10-CM | POA: Diagnosis not present

## 2024-07-14 NOTE — Progress Notes (Signed)
 @[facility]   Provider:  Dedra Gores, MD  Primary Care Physician:  Pcp, No  Referring Provider: Dr Margaret, MD     Jerri Pfeiffer, Md [address] [place],  [place] [address]        Chief Concern for this Consultation:   Patient presents with     ESS 10 Fss at  27     HPI: I have the pleasure of meeting with [name] , on 07-14-2024, who is a 58 y.o. AA male patient, who has recently been seen ofor TIA-  and currently wearing a ZIO patch . He is seen upon a referral by Dr Jerri  for a  Sleep Medicine Consultation.  The patient's referral information asked for a OSA screening test/.  Chief concern according to patient, who  presented with a medical history of : recent TIA .  Bariatric surgery, lost  395 to 234 lbs  from 2013   Past Medical History:  Diagnosis Date   Back pain    compensating from hip pain   History of colon polyps    Joint pain    Osteoarthritis    knees 10/22/2014)     Sleep relevant medical/ surgical and symptom history: The patient reports onset of symptoms over a time period of .  See ROS.  ENT : ( nasal infection, Sinusitis, adenoid and Tonsillectomy at age 51,  Trauma such as TBI/ concussion times 3, baseball-  Mood disorders: Depression.  Bariatric surgery, lost  395 to 234 lbs  from 2013  The patient had no previous sleep evaluations. Family medical history: There are some  ( paternal aunt and cousins) biological family members affected by Sleep apnea.    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  Hes lives in a private home, in a household with spouse,  with a nearly 25 year old son.   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  Nicotine use: /.  ETOH use: infrequent 4-5 a year,  Caffeine intake in form of: Coffee (1 cup in AM), Soft drinks (/), Tea ( /) or Energy drinks ( including those containing   taurine ). Caffeine is last consumed at 10 AM.  Exercises regularly in form of bike , 2 times weekly 8 miles.  Volunteering at  church or other community memberships and engagements.      Sleep habits and routines are as follows: The patient's dinner time is around 6-7 PM.  Evening time is spent by watching TV. The patient goes to bed at, or close to, 11 PM. The bedroom is shared with spouse  and is described as cool, quiet, and dark. The patient reports that it takes 12 minutes to fall asleep, then continues to sleep for 6 hours,  woken up by  the frequent need to void (Nocturia).   Choking, heart palpitations.  The preferred sleep position is laterally, with support of 2 pillows, (on an  adjustable bed/ recliner , 15 degrees ).  The total estimated sleep time is circa 6 hours.  Dreams are reportedly in- frequent/ and can be vivid. Dream enactment has not been reported.   6 AM is the usual week- day rise time. The patient wakes up with an alarm set at 6-6.30 .  he reports not/ mostly/ feeling refreshed and restored in the morning, waking with symptoms such as dry mouth,  morning headaches, stiffness or neck pain- , and fatigue.  He reports numb spot on the left temple.   No sleep paralysis has been experienced.  Naps in daytime are not taken  (there is no desire to nap and plenty opportunity).   Review of Systems: Out of a complete 14 system review, the patient complains of only the following symptoms, and all other reviewed systems are negative.:   Bariatric surgery, lost  395 to 234 lbs  from 2013   Insomnia infrequently , anxiety since TIA is up- palpitations,  Depression/ anxiety:   Headaches in AM    Snoring,  Sleep fragmentation, Nocturia   How likely are you to doze in the following situations: 0 = not likely, 1 = slight chance, 2 = moderate chance, 3 = high chance Sitting and Reading? Watching Television? Sitting inactive in a public place (theater or meeting)? As a passenger in  a car for an hour without a break? Lying down in the afternoon when circumstances permit? Sitting and talking to someone? Sitting quietly after lunch without alcohol? In a car, while stopped for a few minutes in traffic?  ESS 10/ 24    Social History   Socioeconomic History   Marital status: Married    Spouse name: 1   Number of children: Not on file   Years of education: Not on file   Highest education level: Not on file  Occupational History   Not on file  Tobacco Use   Smoking status: Never   Smokeless tobacco: Never  Vaping Use   Vaping status: Never Used  Substance and Sexual Activity   Alcohol use: No   Drug use: No   Sexual activity: Yes  Other Topics Concern   Not on file  Social History Narrative   Not on file   Social Drivers of Health   Financial Resource Strain: Not on file  Food Insecurity: No Food Insecurity (06/06/2024)   Hunger Vital Sign    Worried About Running Out of Food in the Last Year: Never true    Ran Out of Food in the Last Year: Never true  Transportation Needs: No Transportation Needs (06/06/2024)   PRAPARE - Administrator, Civil Service (Medical): No    Lack of Transportation (Non-Medical): No  Physical Activity: Not on file  Stress: Not on file  Social Connections: Not on file    Family History  Problem Relation Age of Onset   Diabetes Mother 61   Stomach cancer Mother 8   Hypertension Mother    Colon cancer Mother    Lung cancer Father 67   Hypertension Father    Alcohol abuse Father    Esophageal cancer Neg Hx    Rectal cancer Neg Hx     Past Medical History:  Diagnosis Date   Back pain    compensating from hip pain   History of colon polyps    Joint pain    Osteoarthritis    knees 10/22/2014)    Past Surgical History:  Procedure Laterality Date   BREATH TEK H PYLORI  02/14/2012   Procedure: BREATH TEK H PYLORI;  Surgeon: Alm VEAR Angle, MD;  Location: [facility];  Service: General;  Laterality:  N/A;   COLONOSCOPY     ESOPHAGOGASTRODUODENOSCOPY     FRACTURE SURGERY     GASTRIC ROUX-EN-Y  06/17/2012   Procedure: LAPAROSCOPIC ROUX-EN-Y GASTRIC;  Surgeon: Alm VEAR Angle, MD;  Location: [facility];  Service: General;  Laterality: N/A;  JOINT REPLACEMENT     KNEE HARDWARE REMOVAL Left 1986   took pin out   PATELLA FRACTURE SURGERY Left 1985   pin placed   TONSILLECTOMY AND ADENOIDECTOMY  ~ 1973   TOTAL HIP ARTHROPLASTY Right 10/21/2014   Procedure: TOTAL HIP ARTHROPLASTY;  Surgeon: Dempsey JINNY Sensor, MD;  Location: [facility];  Service: Orthopedics;  Laterality: Right;   TOTAL KNEE ARTHROPLASTY Left 09/12/2013   Procedure: TOTAL KNEE ARTHROPLASTY;  Surgeon: Dempsey JINNY Sensor, MD;  Location: [facility];  Service: Orthopedics;  Laterality: Left;   WRIST FRACTURE SURGERY Left ~ 1975     Current Outpatient Medications on File Prior to Visit  Medication Sig Dispense Refill   aspirin  EC 81 MG tablet Take 1 tablet (81 mg total) by mouth daily. Swallow whole. 30 tablet 12   atorvastatin  (LIPITOR) 40 MG tablet Take 1 tablet (40 mg total) by mouth daily. (Patient not taking: Reported on 07/14/2024) 30 tablet 0   tiZANidine (ZANAFLEX) 2 MG tablet Take 2 mg by mouth every 6 (six) hours as needed for muscle spasms.     No current facility-administered medications on file prior to visit.    No Known Allergies  Vitals:   07/14/24 1024  BP: 120/70  Pulse: 68       Physical exam:   General: The patient was alert and appears not in acute distress.  Mood and affect are appropriate .  The patient's interactions are: Cooperative, makes eye contact, follows the instructions and answers questions coherently.  The patient is groomed and appropriately groomed and dressed. Head: Normocephalic, atraumatic.  Neck is supple. Mallampati: 3 plus .  The neck circumference measured 16.5  inches. Nasal airflow was patent ,   Overbite / Retrognathia was noted.  Dental status: biological  Cardiovascular:  Regular  rate and cardiac rhythm by palpable pulse. Respiratory: no audible wheezing, no tachypnoea.   Skin:  With evidence of ankle edema.  Trunk:  BMI is 34 The patient's posture was erect.   Neurologic exam : The patient was awake and alert, oriented to place and time.   Attention span & concentration ability appeared normal.  Speech was fluent, without dysarthria, dysphonia or aphasia, and of normal volume.     Cranial nerves:  There was no loss of smell or taste reported  Pupils are round, equal in size and briskly reactive to light.  Funduscopic exam was deferred.  Wears bifocals.  Extraocular movements in vertical and horizontal planes were intact and without nystagmus. (No Diplopia reported). Visual fields by finger perimetry are intact. Hearing was intact to soft voice.    Facial sensation intact to fine touch.  Facial motor strength: Symmetric movement and tongue and uvula move midline.  Neck ROM: rotation, tilt and flexion extension were intact for age and shoulder shrug was symmetrical.    Motor exam:  Symmetric bulk, strength and ROM.   Normal tone without cog- wheeling, and  grip strength  was slighlty weaker on the left .   Sensory:  Fine touch and vibration were tested by tuning fork and intact.  Proprioception tested in the upper extremities was normal.   Coordination: The patient reported no problems with button closure and no changes to penmanship.   The Finger-to-nose maneuver was intact without evidence of ataxia, dysmetria or tremor.   Gait and station: Patient could rise unassisted from a seated position, without bracing, and walked without assistive device.  Stance was of  wide width.  Knee pain, osteoarthritis.  The patient turned with 4 steps. Toe and heel walk were deferred. No limp was noted.  Arm swing was preserved. The patient's gait posture was  normal.    Deep tendon reflexes: Upper extremities did show symmetric DTRs. Lower extremity DTRs were symmetric  and brisk/ attenuated.   Babinski response was deferred /    I would like to thank Dr Margaret  and Jerri Pfeiffer, Md [address] [place],  [place] [address] for allowing me to meet with their patient .    I will briefly summarize my findings:  [name] reports nocturnal palpitations and choking, he h is a snorer, he has been struggling with non restorative sleep and fatigue, less  with sleepiness.  He may have atrial fib, his zio patch study is not yet completed.  Dr Chancy notes were reviewed, dr Benjiman notes were reviewed.  ESS was 10.     Risk factors for OSA were present,  including : Body mass index is 33.69 kg/m., neck size  16.5 and upper airway anatomy. 1)  he describes symptoms of OSA - except he is not excessively daytime sleepy.   My Plan is to proceed with:  PSG/ SPLIT- ordered at [facility] [place] sleep.    I plan to follow up personally within the next 4-5 .months /through our NP within 4-5 months from today.   A total time of  45  minutes consistent of a part of face to face encounter , exam and interview,  and additional preparation time for chart review was spent .  At today's visit, we discussed treatment options, associated risk and benefits, and engage in counseling as needed including, but not limited to:  Sleep hygiene, Quality Sleep Habits, and Safety concerns for patients with daytime sleepiness who are warned to not operate machinery/ motor vehicles when drowsy. Risk factors for sleep apnea were identified.  Additionally, the following were reviewed: Past medical records, past medical and surgical history, family and social background, as well as relevant laboratory results, imaging findings, and medical notes, where applicable.  This note was generated by myself in part by using dictation software, and as a result, it may contain unintentional typos and errors.  Nevertheless, effort was made to accurately convey the pertinent aspects of the patient's  visit.   Dedra Gores, MD  [facility] Board certified in Sleep Medicine by The [employer] of Sleep Medicine and Diplomate of the [employer] (AASM) . Board certified In Neurology, Diplomat of the ABPN,  Fellow of the [employer] Neu[employer].

## 2024-07-14 NOTE — Patient Instructions (Addendum)
 Quality Sleep Information, Adult Quality sleep is important for your mental and physical health. It also improves your quality of life. Quality sleep means you: Are asleep for most of the time you are in bed. Fall asleep within 30 minutes. Wake up no more than once a night. Are awake for no longer than 20 minutes if you do wake up during the night. Most adults need 7-8 hours of quality sleep each night. How can poor sleep affect me? If you do not get enough quality sleep, you may have: Mood swings. Daytime sleepiness. Decreased alertness, reaction time, and concentration. Sleep disorders, such as insomnia and sleep apnea. Difficulty with: Solving problems. Coping with stress. Paying attention. These issues may affect your performance and productivity at work, school, and home. Lack of sleep may also put you at higher risk for accidents, suicide, and risky behaviors. If you do not get quality sleep, you may also be at higher risk for several health problems, including: Infections. Type 2 diabetes. Heart disease. High blood pressure. Obesity. Worsening of long-term conditions, like arthritis, kidney disease, depression, Parkinson's disease, and epilepsy. What actions can I take to get more quality sleep? Sleep schedule and routine Stick to a sleep schedule. Go to sleep and wake up at about the same time each day. Do not try to sleep less on weekdays and make up for lost sleep on weekends. This does not work. Limit naps during the day to 30 minutes or less. Do not take naps in the late afternoon. Make time to relax before bed. Reading, listening to music, or taking a hot bath promotes quality sleep. Make your bedroom a place that promotes quality sleep. Keep your bedroom dark, quiet, and at a comfortable room temperature. Make sure your bed is comfortable. Avoid using electronic devices that give off bright blue light for 30 minutes before bedtime. Your brain perceives bright blue light  as sunlight. This includes television, phones, and computers. If you are lying awake in bed for longer than 20 minutes, get up and do a relaxing activity until you feel sleepy. Lifestyle     Try to get at least 30 minutes of exercise on most days. Do not exercise 2-3 hours before going to bed. Do not use any products that contain nicotine or tobacco. These products include cigarettes, chewing tobacco, and vaping devices, such as e-cigarettes. If you need help quitting, ask your health care provider. Do not drink caffeinated beverages for at least 8 hours before going to bed. Coffee, tea, and some sodas contain caffeine. Do not drink alcohol or eat large meals close to bedtime. Try to get at least 30 minutes of sunlight every day. Morning sunlight is best. Medical concerns Work with your health care provider to treat medical conditions that may affect sleeping, such as: Nasal obstruction. Snoring. Sleep apnea and other sleep disorders. Talk to your health care provider if you think any of your prescription medicines may cause you to have difficulty falling or staying asleep. If you have sleep problems, talk with a sleep consultant. If you think you have a sleep disorder, talk with your health care provider about getting evaluated by a specialist. Where to find more information Sleep Foundation: sleepfoundation.org American Academy of Sleep Medicine: aasm.org Centers for Disease Control and Prevention (CDC): TonerPromos.no Contact a health care provider if: You have trouble getting to sleep or staying asleep. You often wake up very early in the morning and cannot get back to sleep. You have daytime sleepiness. You  have daytime sleep attacks of suddenly falling asleep and sudden muscle weakness (narcolepsy). You have a tingling sensation in your legs with a strong urge to move your legs (restless legs syndrome). You stop breathing briefly during sleep (sleep apnea). You think you have a sleep  disorder or are taking a medicine that is affecting your quality of sleep. Summary Most adults need 7-8 hours of quality sleep each night. Getting enough quality sleep is important for your mental and physical health. Make your bedroom a place that promotes quality sleep, and avoid things that may cause you to have poor sleep, such as alcohol, caffeine, smoking, or large meals. Talk to your health care provider if you have trouble falling asleep or staying asleep. This information is not intended to replace advice given to you by your health care provider. Make sure you discuss any questions you have with your health care provider. Document Revised: 02/01/2022 Document Reviewed: 02/01/2022 Elsevier Patient Education  2024 Elsevier Inc.     I will briefly summarize my findings:  [name] reports nocturnal palpitations and choking, he h is a snorer, he has been struggling with non restorative sleep and fatigue, less  with sleepiness.  He may have atrial fib, his zio patch study is not yet completed.  Dr Chancy notes were reviewed, dr Benjiman notes were reviewed.     Risk factors for OSA were present,  including : Body mass index is 33.69 kg/m., neck size  16.5 and upper airway anatomy. 1)  he describes symptoms of OSA - except he is not excessively daytime sleepy.    My Plan is to proceed with:  PSG/ SPLIT     I plan to follow up personally within the next 4-5 .months /through our NP within 4-5 months.

## 2024-07-14 NOTE — Progress Notes (Unsigned)
 Cardiology Office Note    Date:  07/16/2024  ID:  DEVANSH RIESE, DOB 24-May-1966, MRN [id number] PCP:  Pcp, No  Cardiologist:  None  Electrophysiologist:  None   Chief Complaint: Palpitations   History of Present Illness: Anthony    OLUWASEUN Rhodes is a 58 y.o. male with visit-pertinent history of malignancy, history of gastric bypass surgery in 05/2012, DVT after TKA in 2014.  Per notes patient presented on 12/31/2022 with palpitations, symptoms of skipping heartbeats and feeling like the heart was going to stop resulting in significant anxiety.  Patient denied any other symptoms.  Was noted that patient could climb 3 flights of stairs without any discomfort, noted most episodes were occurring during rest or routine activities but not with exertion or activity.  It was felt that symptoms were indicative of PVCs.  Patient was also noted to have significant loud snoring and was referred for sleep study.  It was recommended that he follow-up as needed.  On chart review on 06/06/2024 patient woke after midnight with onset of left arm and left leg numbness and tingling sensation.  Symptoms lasted about 30 minutes.  Patient drove himself to the emergency room for evaluation and was admitted for workup.  MRI of the brain was negative for acute findings.  He was diagnosed with possible TIA and evaluated medically.  He was discharged on aspirin , Plavix  and statin.  30-day cardiac monitor was ordered by neurology.  Today patient presents regarding increased palpitations and for follow-up regarding TIA.  Patient reports that when he lays down flat it feels as though his heart is beating hard, notes it feels will though his heart rate will increase and he will have occasional pounding beats or skipped beats.  Patient questions if some of this is provoked by increased anxiety, notes that he needs to take tizanidine before bed in order to sleep.  Patient notes that when he wakes in the morning he does not feel refreshed  or that he slept well, is currently planned to have a sleep study with neurology.  Patient currently has cardiac monitor in place regarding increased palpitations and previous TIA.  He denies any chest pain, shortness of breath, orthopnea or PND.  He does note some occasional increased lower extremity edema which he reports is chronic and unchanged.  Patient reports that he continues to intentionally lose weight, congratulated.  He reports that he has been biking for the last 3 weeks for an hour a day 3 times a week, tolerating very well.  He notes that with his increased exercise his blood pressure has been very well-controlled at home.  ROS: .   Today he denies chest pain, shortness of breath, lower extremity edema,melena, hematuria, hemoptysis, diaphoresis, weakness, presyncope, syncope, orthopnea, and PND.  All other systems are reviewed and otherwise negative. Studies Reviewed: Anthony   EKG:  EKG is ordered today, personally reviewed, demonstrating  EKG Interpretation Date/Time:  Tuesday July 15 2024 15:41:06 EDT Ventricular Rate:  73 PR Interval:  244 QRS Duration:  84 QT Interval:  398 QTC Calculation: 438 R Axis:   56  Text Interpretation: Sinus rhythm with 1st degree A-V block Confirmed by Oveta Idris [phone]) on 07/15/2024 9:16:41 PM   CV Studies: Cardiac studies reviewed are outlined and summarized above. Otherwise please see EMR for full report. Cardiac Studies & Procedures   ______________________________________________________________________________________________     ECHOCARDIOGRAM  ECHOCARDIOGRAM COMPLETE 06/07/2024  Narrative ECHOCARDIOGRAM REPORT    Patient Name:   Anthony  JAYSON Rhodes Date of Exam: 06/07/2024 Medical Rec #:  [id number]      Height:       69.5 in Accession #:    [id number]     Weight:       238.0 lb Date of Birth:  11/27/1965      BSA:          2.236 m Patient Age:    58 years       BP:           126/84 mmHg Patient Gender: M              HR:            72 bpm. Exam Location:  Inpatient  Procedure: 2D Echo, Cardiac Doppler and Color Doppler (Both Spectral and Color Flow Doppler were utilized during procedure).  Indications:    Stroke I63.9  History:        Patient has prior history of Echocardiogram examinations, most recent 10/08/2021.  Sonographer:    Thea Norlander RCS Referring Phys: [id number] MARSA NOVAK MELVIN  IMPRESSIONS   1. Left ventricular ejection fraction, by estimation, is 65 to 70%. The left ventricle has normal function. The left ventricle has no regional wall motion abnormalities. Left ventricular diastolic parameters were normal. 2. Right ventricular systolic function is normal. The right ventricular size is normal. 3. The mitral valve is normal in structure. Mild mitral valve regurgitation. 4. The aortic valve is tricuspid. Aortic valve regurgitation is not visualized.  FINDINGS Left Ventricle: Left ventricular ejection fraction, by estimation, is 65 to 70%. The left ventricle has normal function. The left ventricle has no regional wall motion abnormalities. The left ventricular internal cavity size was normal in size. There is no left ventricular hypertrophy. Left ventricular diastolic parameters were normal.  Right Ventricle: The right ventricular size is normal. Right vetricular wall thickness was not assessed. Right ventricular systolic function is normal.  Left Atrium: Left atrial size was normal in size.  Right Atrium: Right atrial size was normal in size.  Pericardium: There is no evidence of pericardial effusion.  Mitral Valve: The mitral valve is normal in structure. Mild mitral valve regurgitation.  Tricuspid Valve: The tricuspid valve is normal in structure. Tricuspid valve regurgitation is trivial.  Aortic Valve: The aortic valve is tricuspid. Aortic valve regurgitation is not visualized. Aortic valve peak gradient measures 7.0 mmHg.  Pulmonic Valve: The pulmonic valve was normal in structure.  Pulmonic valve regurgitation is not visualized.  Aorta: The aortic root and ascending aorta are structurally normal, with no evidence of dilitation.  IAS/Shunts: No atrial level shunt detected by color flow Doppler.   LEFT VENTRICLE PLAX 2D LVIDd:         4.70 cm   Diastology LVIDs:         2.90 cm   LV e' medial:    7.51 cm/s LV PW:         1.10 cm   LV E/e' medial:  8.3 LV IVS:        0.80 cm   LV e' lateral:   10.20 cm/s LVOT diam:     2.60 cm   LV E/e' lateral: 6.1 LV SV:         80 LV SV Index:   36 LVOT Area:     5.31 cm   RIGHT VENTRICLE RV S prime:     16.80 cm/s TAPSE (M-mode): 2.8 cm  LEFT ATRIUM  Index        RIGHT ATRIUM           Index LA diam:      3.60 cm 1.61 cm/m   RA Area:     13.40 cm LA Vol (A2C): 33.4 ml 14.94 ml/m  RA Volume:   28.40 ml  12.70 ml/m LA Vol (A4C): 66.9 ml 29.90 ml/m AORTIC VALVE AV Area (Vmax): 2.95 cm AV Vmax:        132.00 cm/s AV Peak Grad:   7.0 mmHg LVOT Vmax:      73.40 cm/s LVOT Vmean:     50.800 cm/s LVOT VTI:       0.151 m  AORTA Ao Root diam: 3.70 cm Ao Asc diam:  3.50 cm  MITRAL VALVE MV Area (PHT): 3.85 cm    SHUNTS MV Decel Time: 197 msec    Systemic VTI:  0.15 m MV E velocity: 62.40 cm/s  Systemic Diam: 2.60 cm MV A velocity: 54.50 cm/s MV E/A ratio:  1.14  Vina Gull MD Electronically signed by Vina Gull MD Signature Date/Time: 06/07/2024/4:57:46 PM    Final          ______________________________________________________________________________________________       Current Reported Medications:.    Current Meds  Medication Sig   aspirin  EC 81 MG tablet Take 1 tablet (81 mg total) by mouth daily. Swallow whole.   tiZANidine (ZANAFLEX) 2 MG tablet Take 2 mg by mouth every 6 (six) hours as needed for muscle spasms.    Physical Exam:    VS:  BP 110/64 (BP Location: Left Arm, Patient Position: Sitting, Cuff Size: Large)   Pulse 68   Ht 5' 10 (1.778 m)   Wt 231 lb (104.8 kg)    SpO2 99%   BMI 33.15 kg/m    Wt Readings from Last 3 Encounters:  07/15/24 231 lb (104.8 kg)  07/14/24 234 lb 12.8 oz (106.5 kg)  06/24/24 235 lb 12.8 oz (107 kg)    GEN: Well nourished, well developed in no acute distress NECK: No JVD; No carotid bruits CARDIAC: RRR, no murmurs, rubs, gallops RESPIRATORY:  Clear to auscultation without rales, wheezing or rhonchi  ABDOMEN: Soft, non-tender, non-distended EXTREMITIES:  No edema; No acute deformity     Asessement and Plan:.    TIA: On 06/06/2024 patient woke with onset of left arm and left leg numbness and tingling sensation.  Symptoms lasted 30 minutes.  Drums often emergency room with reassuring evaluation.  Felt to possibly be a TIA.  He is currently wearing a 30-day cardiac monitor per neurology.  Was discharged on aspirin , Plavix  and statin.  He has completed Plavix .  He is now being followed by neurology.  Palpitations: Patient reports increased palpitations at night when he lays down flat, he questions if possibly related to anxiety.  Lab work recently overall reassuring.  Patient currently has cardiac monitor in place.  Discussed with patient that at this time given his heart rate of 64 bpm would not recommend starting on any medications until cardiac monitor results are back.  Patient in agreement with plan.  He denies any dizziness, lightheadedness, presyncope or syncope. Patient plans to follow-up once results are available.  Reviewed ED precautions.  OSA: Patient notes that he will wake in the morning and does not feel fully rested.  Patient is currently pending sleep study with neurology.   Disposition: F/u with Chazlyn Cude, NP in two months.   Signed, Gregery Walberg D Javious Hallisey, NP

## 2024-07-15 ENCOUNTER — Ambulatory Visit: Attending: Cardiology | Admitting: Cardiology

## 2024-07-15 VITALS — BP 110/64 | HR 68 | Ht 70.0 in | Wt 231.0 lb

## 2024-07-15 DIAGNOSIS — R002 Palpitations: Secondary | ICD-10-CM | POA: Diagnosis not present

## 2024-07-15 DIAGNOSIS — I440 Atrioventricular block, first degree: Secondary | ICD-10-CM | POA: Diagnosis present

## 2024-07-15 DIAGNOSIS — R200 Anesthesia of skin: Secondary | ICD-10-CM | POA: Insufficient documentation

## 2024-07-15 NOTE — Patient Instructions (Signed)
 Thank you for choosing [facility]!     Medication Instructions:  No medication changes were made during today's visit.  *If you need a refill on your cardiac medications before your next appointment, please call your pharmacy*   Lab Work: No labs were ordered during today's visit.  If you have labs (blood work) drawn today and your tests are completely normal, you will receive your results only by: MyChart Message (if you have MyChart) OR A paper copy in the mail If you have any lab test that is abnormal or we need to change your treatment, we will call you to review the results.   Testing/Procedures: No procedures were ordered during today's visit.   Your next appointment:   2 month(s)   Provider:   Katlyn West, NP           Follow-Up: At [facility], you and your health needs are our priority.  As part of our continuing mission to provide you with exceptional heart care, we have created designated Provider Care Teams.  These Care Teams include your primary Cardiologist (physician) and Advanced Practice Providers (APPs -  Physician Assistants and Nurse Practitioners) who all work together to provide you with the care you need, when you need it. We recommend signing up for the patient portal called MyChart.  Sign up information is provided on this After Visit Summary.  MyChart is used to connect with patients for Virtual Visits (Telemedicine).  Patients are able to view lab/test results, encounter notes, upcoming appointments, etc.  Non-urgent messages can be sent to your provider as well.   To learn more about what you can do with MyChart, go to [email or web address].

## 2024-07-16 ENCOUNTER — Telehealth: Payer: Self-pay | Admitting: Neurology

## 2024-07-16 ENCOUNTER — Encounter: Payer: Self-pay | Admitting: Cardiology

## 2024-07-16 NOTE — Telephone Encounter (Signed)
 Split MCD [facility] pending.

## 2024-07-21 NOTE — Telephone Encounter (Signed)
 Checked status on the portal it is still pending

## 2024-07-24 NOTE — Telephone Encounter (Signed)
 Sent mychart

## 2024-07-24 NOTE — Telephone Encounter (Signed)
 Split MCD [facility] [place]: [id number] (exp. 07/16/24 to 09/29/24)

## 2024-08-01 ENCOUNTER — Ambulatory Visit: Attending: Diagnostic Neuroimaging

## 2024-08-01 DIAGNOSIS — G459 Transient cerebral ischemic attack, unspecified: Secondary | ICD-10-CM

## 2024-08-03 DIAGNOSIS — G459 Transient cerebral ischemic attack, unspecified: Secondary | ICD-10-CM | POA: Diagnosis not present

## 2024-09-01 ENCOUNTER — Ambulatory Visit (INDEPENDENT_AMBULATORY_CARE_PROVIDER_SITE_OTHER): Admitting: Neurology

## 2024-09-01 DIAGNOSIS — Z9189 Other specified personal risk factors, not elsewhere classified: Secondary | ICD-10-CM

## 2024-09-01 DIAGNOSIS — R002 Palpitations: Secondary | ICD-10-CM

## 2024-09-01 DIAGNOSIS — R0683 Snoring: Secondary | ICD-10-CM

## 2024-09-01 DIAGNOSIS — G459 Transient cerebral ischemic attack, unspecified: Secondary | ICD-10-CM

## 2024-09-01 DIAGNOSIS — Z9884 Bariatric surgery status: Secondary | ICD-10-CM

## 2024-09-02 ENCOUNTER — Ambulatory Visit: Admitting: Cardiology

## 2024-09-05 ENCOUNTER — Ambulatory Visit: Payer: Self-pay | Admitting: Neurology

## 2024-09-05 DIAGNOSIS — G459 Transient cerebral ischemic attack, unspecified: Secondary | ICD-10-CM | POA: Insufficient documentation

## 2024-09-05 NOTE — Procedures (Signed)
 [facility] POLYSOMNOGRAPHY  INTERPRETATION REPORT   STUDY DATE:  09/01/2024     PATIENT NAME:  [name]         DATE OF BIRTH:  1966/03/21  PATIENT ID:  [id number]    TYPE OF STUDY:  PSG  SLEEP PHYSICIAN: DEDRA GORES, MD REFERRED BY: Referring Provider: Dr Margaret, MD Dr [name] Dr [name] ,Hospital Stroke Team  SCORING TECHNICIAN: Donnice Counts, RPSGT   HISTORY:  This 58 year-old Male patient was seen on 07-17-2024 after he presented with a TIA to the hospital on 06-06-24. He has a history of gastric bypass. I will briefly summarize my findings: Mr [name] reports nocturnal palpitations and choking, he is a snorer, he has been struggling with non restorative sleep and fatigue, less with sleepiness. He may have atrial fib, his zio patch study is not yet completed.  Dr [name] notes were reviewed, Dr [name] notes were reviewed. ESS was 10. FSS was not obtained. Risk factors for OSA were present, including : Body mass index is 33.69 kg/m., neck size 16.5 and upper airway anatomy.  he describes some common symptoms of OSA - except he is not excessively daytime sleepy. My Plan is to proceed with: PSG/ SPLIT- ordered at [facility] [place] [facility].  ADDITIONAL INFORMATION:  The Epworth Sleepiness Scale was endorsed at 10 /24 points (scores above or equal to 10 are suggestive of hypersomnolence).  Height: 70 in Weight: 234 lb (BMI 33) Neck Size: 17 in  MEDICATIONS: Aspirin , Lipitor, Zanaflex TECHNICAL DESCRIPTION: A registered sleep technologist ( RPSGT)  was in attendance for the duration of the recording.  Data collection, scoring, video monitoring, and reporting were performed in compliance with the AASM Manual for the Scoring of Sleep and Associated Events; (Hypopnea is scored based on the criteria listed in Section VIII D. 1b in the AASM Manual V2.6 using a 4% oxygen desaturation rule or Hypopnea is scored based on the criteria listed in Section VIII D. 1a in the  AASM Manual V2.6 using 3% oxygen desaturation and /or arousal rule).   SLEEP CONTINUITY AND SLEEP ARCHITECTURE:  Lights-out was at 21:43: and lights-on at  04:53:, with  7.2 hours of recording time . Total sleep time ( TST) was 342.5 minutes with a decreased sleep efficiency at 80%.   Sleep latency was increased at 45.5 minutes.  REM sleep latency was normal at 88.5 minutes. Of the total sleep time, the percentage of stage N1 sleep was 7.3%, stage N2 sleep was 77%, stage N3 sleep was 0.0%, and REM sleep was 15.5%.  There were 4 Stage R periods observed on this study night, 29 awakenings (i.e. transitions to Stage W from any sleep stage), and 91 total stage transitions. Wake after sleep onset (WASO) time accounted for 35.5 minutes.  BODY POSITION:  TST was divided  between the following sleep positions: 33.0% supine;  67.0% lateral; left side only-  0% prone. Total supine REM sleep time was 15 minutes (28% of total REM sleep).  RESPIRATORY MONITORING:   Based on CMS criteria (using a 4% oxygen desaturation rule for scoring hypopneas), there were 2 apneas (1 obstructive; 0 central; 1 mixed), and 5 hypopneas.  Apnea index was 0.4. Hypopnea index was 0.9. The apnea-hypopnea index was 1.2/h  overall (1.1 supine, 5 non-supine; 4.5 REM, 4.0 supine REM). Based on AASM criteria (using a 3% oxygen desaturation and /or arousal rule for scoring hypopneas), there were 2 apneas (1 obstructive; 0 central; 1 mixed), and  12 hypopneas. Apnea index was 0.4. Hypopnea index was 2.1. The AHI (apnea-hypopnea index) was 2.5/h overall (2.7 supine, 11 non-supine; 9.1 REM, 4.0 supine REM). There were 0 respiratory effort-related arousals (RERAs).   OXIMETRY: Oxyhemoglobin Saturation Nadir during sleep was at  93% from a mean of 97%.  Total sleep time (TST) in  hypoxemia (=<88%) was 0.0 minutes, or 0.0% of total sleep time.  LIMB MOVEMENTS: There were 8 periodic limb movements of sleep (1.4/hr), of which only 1 (0.2/hr) was  associated with an arousal. AROUSAL: There were 127 arousals in total, for an arousal index of 22 arousals/hour.  Of these, 8 were identified as respiratory-related arousals (1 /h), 1 were PLM-related arousals (0 /h), and 114 were non-specific arousals (20 /h). There were 0 occurrences of Cheyne Stokes breathing.  Snoring was classified as . EEG:  PSG EEG was of normal amplitude and frequency, with symmetric manifestation of sleep stages. EKG: The electrocardiogram documented  NSR.  The average heart rate during sleep was 56 bpm.  The heart rate during sleep varied between a minimum of 43 bpm and  a maximum of  68 bpm. AUDIO and VIDEO: one bathroom visit,  intermittent mild-moderate snoring.   IMPRESSION: 1) Sleep apnea was not present, neither hypoxia or loud snoring.   Total sleep time was reduced at 342.5 minutes.  Sleep efficiency was decreased at 79.7%.    RECOMMENDATIONS: No follow up with sleep neurology is necessary. None of the sleep conditions associated with a higher stroke/ TIA risk was present.  Please follow up with your primary physician and cardiology.    DEDRA GORES,  MD

## 2024-09-07 NOTE — Progress Notes (Signed)
 Let patient know the sleep study was normal

## 2024-09-30 NOTE — Progress Notes (Deleted)
 Cardiology Office Note    Date:  09/30/2024  ID:  [name], DOB 06-13-1966, MRN [id number] PCP:  Pcp, No  Cardiologist:  None  Electrophysiologist:  None   Chief Complaint: ***  History of Present Illness: [name]    [name] is a 58 y.o. 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   Per notes patient presented on 12/31/2022 with palpitations, symptoms of skipping heartbeats and feeling like the heart was going to stop resulting in significant anxiety.  Patient denied any other symptoms.  Was noted that patient could climb 3 flights of stairs without any discomfort, noted most episodes were occurring during rest or routine activities but not with exertion or activity.  It was felt that symptoms were indicative of PVCs.  Patient was also noted to have significant loud snoring and was referred for sleep study.  It was recommended that he follow-up as needed.   On chart review on 06/06/2024 patient woke after midnight with onset of left arm and left leg numbness and tingling sensation.  Symptoms lasted about 30 minutes.  Patient drove himself to the emergency room for evaluation and was admitted for workup.  MRI of the brain was negative for acute findings.  He was diagnosed with possible TIA and evaluated medically.  He was discharged on aspirin , Plavix  and statin.  30-day cardiac monitor was ordered by neurology.  Patient was last seen in clinic on 07/15/2024 regarding increased palpitations and follow-up regarding TIA.  Patient reported that when he lay down flat at night it feels that his heart was beating hard, noted that his legs his heart rate would increase and he would have occasional pounding beats or skipped beats.  Patient questioned if this was provoked by increased anxiety, noted that he needed to take tizanidine before bed in order to sleep.  Patient underwent sleep study with neurology.  At office visit  cardiac monitor was in place regarding increased palpitations and previous TIA.  He noted some occasional increased lower extremity edema which she report was chronic and unchanged.  Cardiac monitor indicated predominant rhythm was sinus rhythm, first-degree AV block was present, there was Mobitz type I AV block in the very early morning,  one 10 beat run of NSVT, asymptomatic.  There were occasional PACs and PVCs, no atrial fibrillation.  Patient split-night sleep study was normal.  Today he presents for follow-up.  He reports that he   TIA: On 06/06/2024 patient woke with onset of left arm and left leg numbness and tingling sensation. Symptoms lasted 30 minutes. Workup in emergency room with reassuring evaluation. Felt to possibly be a TIA.  Cardiac monitor indicated  Followed by neurology  Palpitations:    Labwork independently reviewed:   ROS: .   *** denies chest pain, shortness of breath, lower extremity edema, fatigue, palpitations, melena, hematuria, hemoptysis, diaphoresis, weakness, presyncope, syncope, orthopnea, and PND.  All other systems are reviewed and otherwise negative.  Studies Reviewed: [name]    EKG:  EKG is ordered today, personally reviewed, demonstrating ***     CV Studies: Cardiac studies reviewed are outlined and summarized above. Otherwise please see EMR for full report. Cardiac Studies & Procedures   ______________________________________________________________________________________________     ECHOCARDIOGRAM  ECHOCARDIOGRAM COMPLETE 06/07/2024  Narrative ECHOCARDIOGRAM REPORT    Patient Name:   [name] Date of Exam: 06/07/2024 Medical Rec #:  [id number]  Height:       69.5 in Accession #:    [id number]     Weight:       238.0 lb Date of Birth:  12/21/1965      BSA:          2.236 m Patient Age:    58 years       BP:           126/84 mmHg Patient Gender: M              HR:           72 bpm. Exam Location:  Inpatient  Procedure: 2D Echo,  Cardiac Doppler and Color Doppler (Both Spectral and Color Flow Doppler were utilized during procedure).  Indications:    Stroke I63.9  History:        Patient has prior history of Echocardiogram examinations, most recent 10/08/2021.  Sonographer:    Thea Norlander RCS Referring Phys: [id number] MARSA NOVAK MELVIN  IMPRESSIONS   1. Left ventricular ejection fraction, by estimation, is 65 to 70%. The left ventricle has normal function. The left ventricle has no regional wall motion abnormalities. Left ventricular diastolic parameters were normal. 2. Right ventricular systolic function is normal. The right ventricular size is normal. 3. The mitral valve is normal in structure. Mild mitral valve regurgitation. 4. The aortic valve is tricuspid. Aortic valve regurgitation is not visualized.  FINDINGS Left Ventricle: Left ventricular ejection fraction, by estimation, is 65 to 70%. The left ventricle has normal function. The left ventricle has no regional wall motion abnormalities. The left ventricular internal cavity size was normal in size. There is no left ventricular hypertrophy. Left ventricular diastolic parameters were normal.  Right Ventricle: The right ventricular size is normal. Right vetricular wall thickness was not assessed. Right ventricular systolic function is normal.  Left Atrium: Left atrial size was normal in size.  Right Atrium: Right atrial size was normal in size.  Pericardium: There is no evidence of pericardial effusion.  Mitral Valve: The mitral valve is normal in structure. Mild mitral valve regurgitation.  Tricuspid Valve: The tricuspid valve is normal in structure. Tricuspid valve regurgitation is trivial.  Aortic Valve: The aortic valve is tricuspid. Aortic valve regurgitation is not visualized. Aortic valve peak gradient measures 7.0 mmHg.  Pulmonic Valve: The pulmonic valve was normal in structure. Pulmonic valve regurgitation is not visualized.  Aorta:  The aortic root and ascending aorta are structurally normal, with no evidence of dilitation.  IAS/Shunts: No atrial level shunt detected by color flow Doppler.   LEFT VENTRICLE PLAX 2D LVIDd:         4.70 cm   Diastology LVIDs:         2.90 cm   LV e' medial:    7.51 cm/s LV PW:         1.10 cm   LV E/e' medial:  8.3 LV IVS:        0.80 cm   LV e' lateral:   10.20 cm/s LVOT diam:     2.60 cm   LV E/e' lateral: 6.1 LV SV:         80 LV SV Index:   36 LVOT Area:     5.31 cm   RIGHT VENTRICLE RV S prime:     16.80 cm/s TAPSE (M-mode): 2.8 cm  LEFT ATRIUM           Index        RIGHT ATRIUM  Index LA diam:      3.60 cm 1.61 cm/m   RA Area:     13.40 cm LA Vol (A2C): 33.4 ml 14.94 ml/m  RA Volume:   28.40 ml  12.70 ml/m LA Vol (A4C): 66.9 ml 29.90 ml/m AORTIC VALVE AV Area (Vmax): 2.95 cm AV Vmax:        132.00 cm/s AV Peak Grad:   7.0 mmHg LVOT Vmax:      73.40 cm/s LVOT Vmean:     50.800 cm/s LVOT VTI:       0.151 m  AORTA Ao Root diam: 3.70 cm Ao Asc diam:  3.50 cm  MITRAL VALVE MV Area (PHT): 3.85 cm    SHUNTS MV Decel Time: 197 msec    Systemic VTI:  0.15 m MV E velocity: 62.40 cm/s  Systemic Diam: 2.60 cm MV A velocity: 54.50 cm/s MV E/A ratio:  1.14  Vina Gull MD Electronically signed by Vina Gull MD Signature Date/Time: 06/07/2024/4:57:46 PM    Final    MONITORS  CARDIAC EVENT MONITOR 08/01/2024  Narrative Images from the original result were not included. Event monitor 30 days for TIA starting 06/30/2024: Predominant rhythm was sinus rhythm.  There was first-degree AV block present. There was Mobitz type I AV block at very early mornings around 345 and 5:30 AM. There was a 10 beat NSVT episode at 7:42 AM, asymptomatic. There were occasional PACs and PVCs. There was no atrial fibrillation. Rec: Consider sleep study.       ______________________________________________________________________________________________        Current Reported Medications:.    No outpatient medications have been marked as taking for the 10/02/24 encounter (Appointment) with Kathan Kirker D, NP.    Physical Exam:    VS:  There were no vitals taken for this visit.   Wt Readings from Last 3 Encounters:  07/15/24 231 lb (104.8 kg)  07/14/24 234 lb 12.8 oz (106.5 kg)  06/24/24 235 lb 12.8 oz (107 kg)    GEN: Well nourished, well developed in no acute distress NECK: No JVD; No carotid bruits CARDIAC: ***RRR, no murmurs, rubs, gallops RESPIRATORY:  Clear to auscultation without rales, wheezing or rhonchi  ABDOMEN: Soft, non-tender, non-distended EXTREMITIES:  No edema; No acute deformity     Asessement and Plan:.     ***     Disposition: F/u with ***  Signed, Nihira Puello D Bates [place], NP

## 2024-10-02 ENCOUNTER — Ambulatory Visit: Admitting: Cardiology

## 2024-10-02 DIAGNOSIS — R002 Palpitations: Secondary | ICD-10-CM

## 2024-10-02 DIAGNOSIS — I440 Atrioventricular block, first degree: Secondary | ICD-10-CM

## 2024-10-02 NOTE — Progress Notes (Unsigned)
 Cardiology Office Note    Date:  10/03/2024  ID:  [name], DOB 1965-11-28, MRN [id number] PCP:  Pcp, No  Cardiologist:  Gordy Bergamo, MD  Electrophysiologist:  None   Chief Complaint: Follow up for palpitaitons   History of Present Illness: [name]   [name] is a 58 y.o. 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   Per notes patient presented on 12/31/2022 with palpitations, symptoms of skipping heartbeats and feeling like the heart was going to stop resulting in significant anxiety.  Patient denied any other symptoms.  Was noted that patient could climb 3 flights of stairs without any discomfort, noted most episodes were occurring during rest or routine activities but not with exertion or activity.  It was felt that symptoms were indicative of PVCs.  Patient was also noted to have significant loud snoring and was referred for sleep study.  It was recommended that he follow-up as needed.   On chart review on 06/06/2024 patient woke after midnight with onset of left arm and left leg numbness and tingling sensation.  Symptoms lasted about 30 minutes.  Patient drove himself to the emergency room for evaluation and was admitted for workup.  MRI of the brain was negative for acute findings.  He was diagnosed with possible TIA and evaluated medically.  He was discharged on aspirin , Plavix  and statin.  30-day cardiac monitor was ordered by neurology.  Patient was last seen in clinic on 07/15/2024 regarding increased palpitations and follow-up regarding TIA.  Patient reported that when he lay down flat at night it feels that his heart was beating hard, noted that his legs his heart rate would increase and he would have occasional pounding beats or skipped beats.  Patient questioned if this was provoked by increased anxiety, noted that he needed to take tizanidine before bed in order to sleep.  Patient underwent sleep study with  neurology.  At office visit cardiac monitor was in place regarding increased palpitations and previous TIA.  He noted some occasional increased lower extremity edema which she report was chronic and unchanged.  Cardiac monitor indicated predominant rhythm was sinus rhythm, first-degree AV block was present, there was Mobitz type I AV block in the very early morning,  one 10 beat run of NSVT, asymptomatic.  There were occasional PACs and PVCs, no atrial fibrillation.  Patient split-night sleep study was normal.  Today he presents for follow-up.  He reports that he has been doing well overall.  He denies any chest pain, shortness of breath, increased lower extremity edema, orthopnea or PND.  He denies any palpitations, presyncope or syncope.  Patient continues exercising, riding his bike 8 miles 3 days a week, tolerates very well.  Patient notes that his biggest concern is a earache that he has had in recent weeks, recommend following up with a PCP. ROS: .   Today he denies chest pain, shortness of breath, fatigue, palpitations, melena, hematuria, hemoptysis, diaphoresis, weakness, presyncope, syncope, orthopnea, and PND.  All other systems are reviewed and otherwise negative. Studies Reviewed: [name]   EKG:  EKG is not ordered today.  CV Studies: Cardiac studies reviewed are outlined and summarized above. Otherwise please see EMR for full report. Cardiac Studies & Procedures   ______________________________________________________________________________________________     ECHOCARDIOGRAM  ECHOCARDIOGRAM COMPLETE 06/07/2024  Narrative ECHOCARDIOGRAM REPORT    Patient Name:   [name] Date of Exam: 06/07/2024 Medical  Rec #:  [id number]      Height:       69.5 in Accession #:    [id number]     Weight:       238.0 lb Date of Birth:  Feb 26, 1966      BSA:          2.236 m Patient Age:    58 years       BP:           126/84 mmHg Patient Gender: M              HR:           72 bpm. Exam  Location:  Inpatient  Procedure: 2D Echo, Cardiac Doppler and Color Doppler (Both Spectral and Color Flow Doppler were utilized during procedure).  Indications:    Stroke I63.9  History:        Patient has prior history of Echocardiogram examinations, most recent 10/08/2021.  Sonographer:    Thea Norlander RCS Referring Phys: [id number] MARSA NOVAK MELVIN  IMPRESSIONS   1. Left ventricular ejection fraction, by estimation, is 65 to 70%. The left ventricle has normal function. The left ventricle has no regional wall motion abnormalities. Left ventricular diastolic parameters were normal. 2. Right ventricular systolic function is normal. The right ventricular size is normal. 3. The mitral valve is normal in structure. Mild mitral valve regurgitation. 4. The aortic valve is tricuspid. Aortic valve regurgitation is not visualized.  FINDINGS Left Ventricle: Left ventricular ejection fraction, by estimation, is 65 to 70%. The left ventricle has normal function. The left ventricle has no regional wall motion abnormalities. The left ventricular internal cavity size was normal in size. There is no left ventricular hypertrophy. Left ventricular diastolic parameters were normal.  Right Ventricle: The right ventricular size is normal. Right vetricular wall thickness was not assessed. Right ventricular systolic function is normal.  Left Atrium: Left atrial size was normal in size.  Right Atrium: Right atrial size was normal in size.  Pericardium: There is no evidence of pericardial effusion.  Mitral Valve: The mitral valve is normal in structure. Mild mitral valve regurgitation.  Tricuspid Valve: The tricuspid valve is normal in structure. Tricuspid valve regurgitation is trivial.  Aortic Valve: The aortic valve is tricuspid. Aortic valve regurgitation is not visualized. Aortic valve peak gradient measures 7.0 mmHg.  Pulmonic Valve: The pulmonic valve was normal in structure. Pulmonic valve  regurgitation is not visualized.  Aorta: The aortic root and ascending aorta are structurally normal, with no evidence of dilitation.  IAS/Shunts: No atrial level shunt detected by color flow Doppler.   LEFT VENTRICLE PLAX 2D LVIDd:         4.70 cm   Diastology LVIDs:         2.90 cm   LV e' medial:    7.51 cm/s LV PW:         1.10 cm   LV E/e' medial:  8.3 LV IVS:        0.80 cm   LV e' lateral:   10.20 cm/s LVOT diam:     2.60 cm   LV E/e' lateral: 6.1 LV SV:         80 LV SV Index:   36 LVOT Area:     5.31 cm   RIGHT VENTRICLE RV S prime:     16.80 cm/s TAPSE (M-mode): 2.8 cm  LEFT ATRIUM           Index  RIGHT ATRIUM           Index LA diam:      3.60 cm 1.61 cm/m   RA Area:     13.40 cm LA Vol (A2C): 33.4 ml 14.94 ml/m  RA Volume:   28.40 ml  12.70 ml/m LA Vol (A4C): 66.9 ml 29.90 ml/m AORTIC VALVE AV Area (Vmax): 2.95 cm AV Vmax:        132.00 cm/s AV Peak Grad:   7.0 mmHg LVOT Vmax:      73.40 cm/s LVOT Vmean:     50.800 cm/s LVOT VTI:       0.151 m  AORTA Ao Root diam: 3.70 cm Ao Asc diam:  3.50 cm  MITRAL VALVE MV Area (PHT): 3.85 cm    SHUNTS MV Decel Time: 197 msec    Systemic VTI:  0.15 m MV E velocity: 62.40 cm/s  Systemic Diam: 2.60 cm MV A velocity: 54.50 cm/s MV E/A ratio:  1.14  Vina Gull MD Electronically signed by Vina Gull MD Signature Date/Time: 06/07/2024/4:57:46 PM    Final    MONITORS  CARDIAC EVENT MONITOR 08/01/2024  Narrative Images from the original result were not included. Event monitor 30 days for TIA starting 06/30/2024: Predominant rhythm was sinus rhythm.  There was first-degree AV block present. There was Mobitz type I AV block at very early mornings around 345 and 5:30 AM. There was a 10 beat NSVT episode at 7:42 AM, asymptomatic. There were occasional PACs and PVCs. There was no atrial fibrillation. Rec: Consider sleep study.        ______________________________________________________________________________________________       Current Reported Medications:.    Current Meds  Medication Sig   aspirin  EC 81 MG tablet Take 1 tablet (81 mg total) by mouth daily. Swallow whole.   tiZANidine (ZANAFLEX) 2 MG tablet Take 2 mg by mouth every 6 (six) hours as needed for muscle spasms.    Physical Exam:    VS:  BP 122/78   Pulse 62   Ht 5' 9 (1.753 m)   Wt 227 lb 12.8 oz (103.3 kg)   SpO2 99%   BMI 33.64 kg/m    Wt Readings from Last 3 Encounters:  10/03/24 227 lb 12.8 oz (103.3 kg)  07/15/24 231 lb (104.8 kg)  07/14/24 234 lb 12.8 oz (106.5 kg)    GEN: Well nourished, well developed in no acute distress NECK: No JVD; No carotid bruits CARDIAC: RRR, no murmurs, rubs, gallops RESPIRATORY:  Clear to auscultation without rales, wheezing or rhonchi  ABDOMEN: Soft, non-tender, non-distended EXTREMITIES:  No edema; No acute deformity     Asessement and Plan:.    TIA: On 06/06/2024 patient woke with onset of left arm and left leg numbness and tingling sensation. Symptoms lasted 30 minutes. Workup in emergency room with reassuring evaluation. Felt to possibly be a TIA. Cardiac monitor indicated predominant rhythm was sinus rhythm, first-degree AV block was present, there was Mobitz type I AV block in the very early morning,  one 10 beat run of NSVT, asymptomatic.  There were occasional PACs and PVCs, no atrial fibrillation. Denies recurrence. Patient split-night sleep study was normal.  Continue aspirin  81 mg daily and Lipitor 40 mg daily.  Followed by neurology   Palpitations: Cardiac monitor reassuring as noted above.  Patient denies any further palpitations.  Dyslipidemia: LDL goal less than 70 given history of TIA.  LDL in 05/2024 80.  Patient notes that he ran out of his atorvastatin .  Will refill atorvastatin  40 mg daily today.  Check fasting lipid profile and LFTs in 8 weeks.   Disposition: F/u with Dr.  Ladona in six months or sooner if needed.   Signed, Chonda Baney D Minard [place], NP

## 2024-10-03 ENCOUNTER — Ambulatory Visit: Attending: Cardiology | Admitting: Cardiology

## 2024-10-03 ENCOUNTER — Encounter: Payer: Self-pay | Admitting: Cardiology

## 2024-10-03 VITALS — BP 122/78 | HR 62 | Ht 69.0 in | Wt 227.8 lb

## 2024-10-03 DIAGNOSIS — G459 Transient cerebral ischemic attack, unspecified: Secondary | ICD-10-CM

## 2024-10-03 DIAGNOSIS — Z79899 Other long term (current) drug therapy: Secondary | ICD-10-CM | POA: Diagnosis not present

## 2024-10-03 DIAGNOSIS — R002 Palpitations: Secondary | ICD-10-CM

## 2024-10-03 DIAGNOSIS — E782 Mixed hyperlipidemia: Secondary | ICD-10-CM

## 2024-10-03 MED ORDER — ATORVASTATIN CALCIUM 40 MG PO TABS: 40.0000 mg | ORAL_TABLET | Freq: Every day | ORAL | 3 refills | Status: AC

## 2024-10-03 NOTE — Patient Instructions (Signed)
 Medication Instructions:  Your physician recommends that you continue on your current medications as directed. Please refer to the Current Medication list given to you today.  *If you need a refill on your cardiac medications before your next appointment, please call your pharmacy*  Lab Work: In 8 weeks: Fasting Lipids, LFTs  If you have labs (blood work) drawn today and your tests are completely normal, you will receive your results only by: MyChart Message (if you have MyChart) OR A paper copy in the mail If you have any lab test that is abnormal or we need to change your treatment, we will call you to review the results.  Testing/Procedures: NONE  Follow-Up: At [facility], you and your health needs are our priority.  As part of our continuing mission to provide you with exceptional heart care, our providers are all part of one team.  This team includes your primary Cardiologist (physician) and Advanced Practice Providers or APPs (Physician Assistants and Nurse Practitioners) who all work together to provide you with the care you need, when you need it.  Your next appointment:   6 month(s)  Provider:   Dr Ladona

## 2024-10-29 ENCOUNTER — Ambulatory Visit (INDEPENDENT_AMBULATORY_CARE_PROVIDER_SITE_OTHER): Payer: Self-pay | Admitting: Neurology

## 2024-10-29 ENCOUNTER — Encounter: Payer: Self-pay | Admitting: Neurology

## 2024-10-29 VITALS — BP 130/80 | HR 69 | Ht 69.0 in | Wt 227.0 lb

## 2024-10-29 DIAGNOSIS — G459 Transient cerebral ischemic attack, unspecified: Secondary | ICD-10-CM

## 2024-10-29 NOTE — Progress Notes (Signed)
 Please follow up with primary neurologist, Dr Margaret. Your sleep work up was negative for apnea.  Pt states had TIA and is here for f/u. Pt states still has some numbness in left arm and feels some pressure in left side of head. Pt states taking Zanaflex daily for sleep and states has been having palpitations.           Provider:  Dedra Gores, MD  Primary Neurologist: Dr Margaret, MD    Referring Provider:       Chief Complaint according to patient   Patient presents with:                HISTORY OF PRESENT ILLNESS:  [name] is a 59 y.o. male patient who is here for revisit 10/29/2024 for  a new problem but  has been seen by me only for a sleep consult and study, apnea negative. His cardiologist and his primary neurologist and PCP were all informed about the negative sleep study result.      Social History   Socioeconomic History   Marital status: Married    Spouse name: 1   Number of children: Not on file   Years of education: Not on file   Highest education level: Not on file  Occupational History   Not on file  Tobacco Use   Smoking status: Never   Smokeless tobacco: Never  Vaping Use   Vaping status: Never Used  Substance and Sexual Activity   Alcohol use: No   Drug use: No   Sexual activity: Yes  Other Topics Concern   Not on file  Social History Narrative   Not on file   Social Drivers of Health   Tobacco Use: Low Risk (10/29/2024)   Patient History    Smoking Tobacco Use: Never    Smokeless Tobacco Use: Never    Passive Exposure: Not on file  Financial Resource Strain: Not on file  Food Insecurity: No Food Insecurity (06/06/2024)   Epic    Worried About Programme Researcher, Broadcasting/film/video in the Last Year: Never true    Ran Out of Food in the Last Year: Never true  Transportation Needs: No Transportation Needs (06/06/2024)   Epic    Lack of Transportation (Medical): No    Lack of Transportation (Non-Medical): No  Physical Activity: Not on file   Stress: Not on file  Social Connections: Not on file  Depression (EYV7-0): Not on file  Alcohol Screen: Not on file  Housing: Low Risk (06/06/2024)   Epic    Unable to Pay for Housing in the Last Year: No    Number of Times Moved in the Last Year: 0    Homeless in the Last Year: No  Utilities: Not At Risk (06/06/2024)   Epic    Threatened with loss of utilities: No  Health Literacy: Not on file    Family History  Problem Relation Age of Onset   Diabetes Mother 92   Stomach cancer Mother 49   Hypertension Mother    Colon cancer Mother    Lung cancer Father 63   Hypertension Father    Alcohol abuse Father    Esophageal cancer Neg Hx    Rectal cancer Neg Hx     Past Medical History:  Diagnosis Date   Back pain    compensating from hip pain   History of colon polyps    Joint pain    Osteoarthritis    knees 10/22/2014)  Past Surgical History:  Procedure Laterality Date   BREATH SHIRLEAN VEAR LORA  02/14/2012   Procedure: BREATH TEK H PYLORI;  Surgeon: Alm VEAR Angle, MD;  Location: [facility];  Service: General;  Laterality: N/A;   COLONOSCOPY     ESOPHAGOGASTRODUODENOSCOPY     FRACTURE SURGERY     GASTRIC ROUX-EN-Y  06/17/2012   Procedure: LAPAROSCOPIC ROUX-EN-Y GASTRIC;  Surgeon: Alm VEAR Angle, MD;  Location: [facility];  Service: General;  Laterality: N/A;   JOINT REPLACEMENT     KNEE HARDWARE REMOVAL Left 1986   took pin out   PATELLA FRACTURE SURGERY Left 1985   pin placed   TONSILLECTOMY AND ADENOIDECTOMY  ~ 1973   TOTAL HIP ARTHROPLASTY Right 10/21/2014   Procedure: TOTAL HIP ARTHROPLASTY;  Surgeon: Dempsey JINNY Sensor, MD;  Location: [facility];  Service: Orthopedics;  Laterality: Right;   TOTAL KNEE ARTHROPLASTY Left 09/12/2013   Procedure: TOTAL KNEE ARTHROPLASTY;  Surgeon: Dempsey JINNY Sensor, MD;  Location: [facility];  Service: Orthopedics;  Laterality: Left;   WRIST FRACTURE SURGERY Left ~ 1975     Medications Ordered Prior to Encounter[1]  Allergies[2]   DIAGNOSTIC  DATA (LABS, IMAGING, TESTING) - I reviewed patient records, labs, notes, testing and imaging myself where available.  Lab Results  Component Value Date   WBC 5.1 06/07/2024   HGB 11.4 (L) 06/07/2024   HCT 35.7 (L) 06/07/2024   MCV 83.4 06/07/2024   PLT 210 06/07/2024      Component Value Date/Time   NA 135 06/07/2024 0358   NA 137 01/31/2012 1143   K 4.1 06/07/2024 0358   CL 106 06/07/2024 0358   CO2 22 06/07/2024 0358   GLUCOSE 89 06/07/2024 0358   BUN 14 06/07/2024 0358   BUN 17 01/31/2012 1143   CREATININE 0.99 06/07/2024 0358   CALCIUM  8.6 (L) 06/07/2024 0358   PROT 6.0 (L) 06/07/2024 0358   PROT 7.1 01/31/2012 1143   ALBUMIN 3.3 (L) 06/07/2024 0358   ALBUMIN 4.3 01/31/2012 1143   AST 17 06/07/2024 0358   ALT 16 06/07/2024 0358   ALKPHOS 66 06/07/2024 0358   BILITOT 0.6 06/07/2024 0358   GFRNONAA >60 06/07/2024 0358   GFRAA >90 10/22/2014 0517   Lab Results  Component Value Date   CHOL 135 06/07/2024   HDL 47 06/07/2024   LDLCALC 80 06/07/2024   TRIG 41 06/07/2024   CHOLHDL 2.9 06/07/2024   Lab Results  Component Value Date   HGBA1C 5.2 06/07/2024   Lab Results  Component Value Date   VITAMINB12 293 06/24/2024   Lab Results  Component Value Date   TSH 3.810 12/31/2022    PHYSICAL EXAM:  Vitals:   10/29/24 1128  BP: 130/80  Pulse: 69   No data found. Body mass index is 33.52 kg/m.   Wt Readings from Last 3 Encounters:  10/29/24 227 lb (103 kg)  10/03/24 227 lb 12.8 oz (103.3 kg)  07/15/24 231 lb (104.8 kg)     Ht Readings from Last 3 Encounters:  10/29/24 5' 9 (1.753 m)  10/03/24 5' 9 (1.753 m)  07/15/24 5' 10 (1.778 m)     No Charge visit. No sleep concern, he wants to address cognitive issues, but deb nies short term memory loss, brain fog and inattentiveness.   Please contact  your PCP.    Please make an appointment with your primary neurologist, Dr Margaret.    Electronically signed by: Dedra Gores, MD 10/29/2024 12:06  PM  [facility] Board certified by [employer] of Sleep Medicine and Diplomate of the [employer] of [employer]. Board certified In Neurology through the ABPN, Fellow of the [employer] of Neu[employer].       [1]  Current Outpatient Medications on File Prior to Visit  Medication Sig Dispense Refill   aspirin  EC 81 MG tablet Take 1 tablet (81 mg total) by mouth daily. Swallow whole. 30 tablet 12   atorvastatin  (LIPITOR) 40 MG tablet Take 1 tablet (40 mg total) by mouth daily. 90 tablet 3   tiZANidine (ZANAFLEX) 2 MG tablet Take 2 mg by mouth every 6 (six) hours as needed for muscle spasms.     No current facility-administered medications on file prior to visit.  [2] No Known Allergies

## 2024-10-29 NOTE — Patient Instructions (Signed)
 Please do not reschedule with me, he has  to follow up with a primary physician, then his neurologist .

## 2024-11-24 ENCOUNTER — Telehealth: Payer: Self-pay | Admitting: Diagnostic Neuroimaging

## 2024-11-24 NOTE — Telephone Encounter (Signed)
Office closed due to weather 

## 2024-11-25 ENCOUNTER — Ambulatory Visit: Admitting: Diagnostic Neuroimaging

## 2024-12-18 ENCOUNTER — Ambulatory Visit: Admitting: Diagnostic Neuroimaging
